# Patient Record
Sex: Female | Born: 1960
Health system: Southern US, Community
[De-identification: ages and names within clinical notes are randomized; demographics above are authoritative.]

## PROBLEM LIST (undated history)

## (undated) DIAGNOSIS — I10 Essential (primary) hypertension: Secondary | ICD-10-CM

## (undated) DIAGNOSIS — Z8489 Family history of other specified conditions: Secondary | ICD-10-CM

## (undated) DIAGNOSIS — Z952 Presence of prosthetic heart valve: Secondary | ICD-10-CM

## (undated) DIAGNOSIS — J45909 Unspecified asthma, uncomplicated: Secondary | ICD-10-CM

## (undated) DIAGNOSIS — E785 Hyperlipidemia, unspecified: Secondary | ICD-10-CM

## (undated) DIAGNOSIS — Q249 Congenital malformation of heart, unspecified: Secondary | ICD-10-CM

## (undated) DIAGNOSIS — E119 Type 2 diabetes mellitus without complications: Secondary | ICD-10-CM

## (undated) DIAGNOSIS — R404 Transient alteration of awareness: Secondary | ICD-10-CM

## (undated) DIAGNOSIS — Z9889 Other specified postprocedural states: Secondary | ICD-10-CM

## (undated) DIAGNOSIS — R112 Nausea with vomiting, unspecified: Secondary | ICD-10-CM

## (undated) DIAGNOSIS — Z950 Presence of cardiac pacemaker: Secondary | ICD-10-CM

## (undated) DIAGNOSIS — K219 Gastro-esophageal reflux disease without esophagitis: Secondary | ICD-10-CM

## (undated) DIAGNOSIS — F329 Major depressive disorder, single episode, unspecified: Secondary | ICD-10-CM

## (undated) DIAGNOSIS — H9190 Unspecified hearing loss, unspecified ear: Secondary | ICD-10-CM

## (undated) DIAGNOSIS — F32A Depression, unspecified: Secondary | ICD-10-CM

## (undated) HISTORY — DX: Essential (primary) hypertension: I10

## (undated) HISTORY — PX: CARDIAC VALVE REPLACEMENT: SHX585

## (undated) HISTORY — DX: Unspecified hearing loss, unspecified ear: H91.90

## (undated) HISTORY — DX: Congenital malformation of heart, unspecified: Q24.9

## (undated) HISTORY — DX: Major depressive disorder, single episode, unspecified: F32.9

## (undated) HISTORY — DX: Depression, unspecified: F32.A

## (undated) HISTORY — DX: Gastro-esophageal reflux disease without esophagitis: K21.9

## (undated) HISTORY — PX: CHOLECYSTECTOMY: SHX55

## (undated) HISTORY — DX: Unspecified asthma, uncomplicated: J45.909

## (undated) HISTORY — DX: Hyperlipidemia, unspecified: E78.5

## (undated) HISTORY — DX: Type 2 diabetes mellitus without complications: E11.9

---

## 1998-04-23 HISTORY — PX: ABDOMINAL HYSTERECTOMY: SHX81

## 2005-03-02 ENCOUNTER — Emergency Department: Payer: Self-pay | Admitting: Emergency Medicine

## 2006-04-12 ENCOUNTER — Emergency Department: Payer: Self-pay | Admitting: Emergency Medicine

## 2008-08-13 ENCOUNTER — Emergency Department: Payer: Self-pay | Admitting: Emergency Medicine

## 2009-03-20 ENCOUNTER — Emergency Department: Payer: Self-pay | Admitting: Internal Medicine

## 2009-09-20 ENCOUNTER — Emergency Department: Payer: Self-pay | Admitting: Emergency Medicine

## 2010-08-25 ENCOUNTER — Emergency Department: Payer: Self-pay | Admitting: Emergency Medicine

## 2010-09-27 ENCOUNTER — Emergency Department: Payer: Self-pay | Admitting: Emergency Medicine

## 2010-11-14 ENCOUNTER — Ambulatory Visit: Payer: Self-pay | Admitting: Unknown Physician Specialty

## 2010-11-22 ENCOUNTER — Ambulatory Visit: Payer: Self-pay | Admitting: Unknown Physician Specialty

## 2010-12-23 ENCOUNTER — Ambulatory Visit: Payer: Self-pay | Admitting: Unknown Physician Specialty

## 2011-01-22 ENCOUNTER — Ambulatory Visit: Payer: Self-pay | Admitting: Unknown Physician Specialty

## 2011-01-24 ENCOUNTER — Ambulatory Visit: Payer: Self-pay

## 2011-05-01 DIAGNOSIS — I10 Essential (primary) hypertension: Secondary | ICD-10-CM | POA: Insufficient documentation

## 2011-05-01 DIAGNOSIS — H9190 Unspecified hearing loss, unspecified ear: Secondary | ICD-10-CM | POA: Insufficient documentation

## 2011-05-01 DIAGNOSIS — K219 Gastro-esophageal reflux disease without esophagitis: Secondary | ICD-10-CM | POA: Insufficient documentation

## 2011-09-12 DIAGNOSIS — N3281 Overactive bladder: Secondary | ICD-10-CM | POA: Insufficient documentation

## 2011-09-12 DIAGNOSIS — F32A Depression, unspecified: Secondary | ICD-10-CM | POA: Insufficient documentation

## 2011-09-12 DIAGNOSIS — F329 Major depressive disorder, single episode, unspecified: Secondary | ICD-10-CM | POA: Insufficient documentation

## 2011-09-12 DIAGNOSIS — M549 Dorsalgia, unspecified: Secondary | ICD-10-CM | POA: Insufficient documentation

## 2011-09-12 DIAGNOSIS — F1490 Cocaine use, unspecified, uncomplicated: Secondary | ICD-10-CM | POA: Insufficient documentation

## 2011-09-12 DIAGNOSIS — J4540 Moderate persistent asthma, uncomplicated: Secondary | ICD-10-CM | POA: Insufficient documentation

## 2011-09-12 DIAGNOSIS — Z720 Tobacco use: Secondary | ICD-10-CM | POA: Insufficient documentation

## 2011-09-16 DIAGNOSIS — E789 Disorder of lipoprotein metabolism, unspecified: Secondary | ICD-10-CM | POA: Insufficient documentation

## 2012-06-19 ENCOUNTER — Ambulatory Visit: Payer: Self-pay

## 2013-07-14 ENCOUNTER — Ambulatory Visit: Payer: Self-pay | Admitting: Internal Medicine

## 2013-12-08 ENCOUNTER — Ambulatory Visit: Payer: Self-pay | Admitting: Family Medicine

## 2014-07-30 ENCOUNTER — Ambulatory Visit
Admit: 2014-07-30 | Disposition: A | Discharge status: Home / Self Care | Payer: Self-pay | Attending: Internal Medicine | Admitting: Internal Medicine

## 2015-05-04 DIAGNOSIS — R05 Cough: Secondary | ICD-10-CM | POA: Diagnosis not present

## 2015-05-23 DIAGNOSIS — I10 Essential (primary) hypertension: Secondary | ICD-10-CM | POA: Diagnosis not present

## 2015-05-23 DIAGNOSIS — E119 Type 2 diabetes mellitus without complications: Secondary | ICD-10-CM | POA: Diagnosis not present

## 2015-05-24 DIAGNOSIS — E119 Type 2 diabetes mellitus without complications: Secondary | ICD-10-CM | POA: Diagnosis not present

## 2015-05-24 DIAGNOSIS — I10 Essential (primary) hypertension: Secondary | ICD-10-CM | POA: Diagnosis not present

## 2015-05-27 DIAGNOSIS — R05 Cough: Secondary | ICD-10-CM | POA: Diagnosis not present

## 2015-05-27 DIAGNOSIS — R5383 Other fatigue: Secondary | ICD-10-CM | POA: Diagnosis not present

## 2015-05-30 DIAGNOSIS — E119 Type 2 diabetes mellitus without complications: Secondary | ICD-10-CM | POA: Diagnosis not present

## 2015-06-09 ENCOUNTER — Other Ambulatory Visit: Payer: Self-pay | Admitting: Internal Medicine

## 2015-06-09 DIAGNOSIS — Z1231 Encounter for screening mammogram for malignant neoplasm of breast: Secondary | ICD-10-CM

## 2015-06-19 DIAGNOSIS — J209 Acute bronchitis, unspecified: Secondary | ICD-10-CM | POA: Diagnosis not present

## 2015-06-21 DIAGNOSIS — I10 Essential (primary) hypertension: Secondary | ICD-10-CM | POA: Diagnosis not present

## 2015-07-22 DIAGNOSIS — E119 Type 2 diabetes mellitus without complications: Secondary | ICD-10-CM | POA: Diagnosis not present

## 2015-07-22 DIAGNOSIS — I10 Essential (primary) hypertension: Secondary | ICD-10-CM | POA: Diagnosis not present

## 2015-08-02 ENCOUNTER — Ambulatory Visit: Payer: Self-pay | Attending: Internal Medicine

## 2015-08-26 ENCOUNTER — Ambulatory Visit: Admission: RE | Admit: 2015-08-26 | Payer: Self-pay | Source: Ambulatory Visit

## 2015-08-31 DIAGNOSIS — R109 Unspecified abdominal pain: Secondary | ICD-10-CM | POA: Diagnosis not present

## 2015-08-31 DIAGNOSIS — N10 Acute tubulo-interstitial nephritis: Secondary | ICD-10-CM | POA: Diagnosis not present

## 2015-09-08 DIAGNOSIS — R350 Frequency of micturition: Secondary | ICD-10-CM | POA: Diagnosis not present

## 2015-09-08 DIAGNOSIS — M549 Dorsalgia, unspecified: Secondary | ICD-10-CM | POA: Diagnosis not present

## 2015-09-08 DIAGNOSIS — M546 Pain in thoracic spine: Secondary | ICD-10-CM | POA: Diagnosis not present

## 2015-10-21 DIAGNOSIS — E119 Type 2 diabetes mellitus without complications: Secondary | ICD-10-CM | POA: Diagnosis not present

## 2015-11-01 DIAGNOSIS — E119 Type 2 diabetes mellitus without complications: Secondary | ICD-10-CM | POA: Diagnosis not present

## 2015-11-01 DIAGNOSIS — I10 Essential (primary) hypertension: Secondary | ICD-10-CM | POA: Diagnosis not present

## 2015-11-01 DIAGNOSIS — J4530 Mild persistent asthma, uncomplicated: Secondary | ICD-10-CM | POA: Diagnosis not present

## 2016-01-10 ENCOUNTER — Ambulatory Visit: Payer: Self-pay

## 2016-01-26 ENCOUNTER — Ambulatory Visit: Payer: Self-pay | Attending: Internal Medicine

## 2016-01-27 DIAGNOSIS — E119 Type 2 diabetes mellitus without complications: Secondary | ICD-10-CM | POA: Diagnosis not present

## 2016-02-03 ENCOUNTER — Other Ambulatory Visit: Payer: Self-pay | Admitting: Internal Medicine

## 2016-02-03 DIAGNOSIS — J4520 Mild intermittent asthma, uncomplicated: Secondary | ICD-10-CM | POA: Diagnosis not present

## 2016-02-03 DIAGNOSIS — I10 Essential (primary) hypertension: Secondary | ICD-10-CM | POA: Diagnosis not present

## 2016-02-03 DIAGNOSIS — K759 Inflammatory liver disease, unspecified: Secondary | ICD-10-CM

## 2016-02-03 DIAGNOSIS — Z1231 Encounter for screening mammogram for malignant neoplasm of breast: Secondary | ICD-10-CM | POA: Diagnosis not present

## 2016-02-03 DIAGNOSIS — E789 Disorder of lipoprotein metabolism, unspecified: Secondary | ICD-10-CM | POA: Diagnosis not present

## 2016-02-03 DIAGNOSIS — E119 Type 2 diabetes mellitus without complications: Secondary | ICD-10-CM | POA: Diagnosis not present

## 2016-02-03 DIAGNOSIS — N390 Urinary tract infection, site not specified: Secondary | ICD-10-CM | POA: Diagnosis not present

## 2016-02-03 DIAGNOSIS — Z23 Encounter for immunization: Secondary | ICD-10-CM | POA: Diagnosis not present

## 2016-02-03 DIAGNOSIS — Z720 Tobacco use: Secondary | ICD-10-CM | POA: Diagnosis not present

## 2016-02-03 DIAGNOSIS — Z1211 Encounter for screening for malignant neoplasm of colon: Secondary | ICD-10-CM | POA: Diagnosis not present

## 2016-02-13 ENCOUNTER — Ambulatory Visit: Payer: Self-pay

## 2016-02-13 ENCOUNTER — Ambulatory Visit
Admission: RE | Admit: 2016-02-13 | Discharge: 2016-02-13 | Disposition: A | Discharge status: Home / Self Care | Payer: PPO | Source: Ambulatory Visit | Attending: Internal Medicine | Admitting: Internal Medicine

## 2016-02-13 DIAGNOSIS — K759 Inflammatory liver disease, unspecified: Secondary | ICD-10-CM | POA: Insufficient documentation

## 2016-02-13 DIAGNOSIS — R932 Abnormal findings on diagnostic imaging of liver and biliary tract: Secondary | ICD-10-CM | POA: Insufficient documentation

## 2016-02-13 DIAGNOSIS — Z9049 Acquired absence of other specified parts of digestive tract: Secondary | ICD-10-CM | POA: Diagnosis not present

## 2016-02-27 DIAGNOSIS — E119 Type 2 diabetes mellitus without complications: Secondary | ICD-10-CM | POA: Diagnosis not present

## 2016-03-23 ENCOUNTER — Encounter: Payer: PPO | Attending: Internal Medicine | Admitting: *Deleted

## 2016-03-23 ENCOUNTER — Encounter: Payer: Self-pay | Admitting: *Deleted

## 2016-03-23 VITALS — BP 132/84 | Ht 62.0 in | Wt 130.9 lb

## 2016-03-23 DIAGNOSIS — Z713 Dietary counseling and surveillance: Secondary | ICD-10-CM | POA: Insufficient documentation

## 2016-03-23 DIAGNOSIS — E119 Type 2 diabetes mellitus without complications: Secondary | ICD-10-CM | POA: Insufficient documentation

## 2016-03-23 NOTE — Patient Instructions (Signed)
Check blood sugars 1 x day before breakfast or 2 hrs after supper every day  Exercise: Continue walking for 30  minutes  3 days a week and gradually increase to 30 minutes 5 x week  Eat 3 meals day,  1-2 snacks a day Space meals 4-6 hours apart Complete 3 Day Food Record and bring to next appt  Bring blood sugar records to the next appointment  Return for appointment on: Friday April 13, 2016 at 1:30 pm with Brianna Harris (dietitian)

## 2016-03-23 NOTE — Progress Notes (Signed)
Diabetes Self-Management Education  Visit Type: First/Initial  Appt. Start Time: 1330 Appt. End Time: [id number]  03/23/2016  [name], identified by name and date of birth, is a 55 y.o. female with a diagnosis of Diabetes: Type 2.   ASSESSMENT  Blood pressure 132/84, height 5\' 2"  (1.575 m), weight 130 lb 14.4 oz (59.4 kg). Body mass index is 23.94 kg/m.      Diabetes Self-Management Education - 03/23/16 1545      Visit Information   Visit Type First/Initial     Initial Visit   Diabetes Type Type 2   Are you currently following a meal plan? No   Are you taking your medications as prescribed? Yes   Date Diagnosed 10 years ago     Health Coping   How would you rate your overall health? Excellent     Psychosocial Assessment   Patient Belief/Attitude about Diabetes Other (comment)  "happy"   Self-care barriers Hard of hearing  deaf   Self-management support Doctor's office;Friends   Other persons present Friend;Interpreter   Patient Concerns Nutrition/Meal planning;Glycemic Control;Medication;Monitoring   Special Needs Simplified materials   Preferred Learning Style Visual;Hands on   Learning Readiness Contemplating   How often do you need to have someone help you when you read instructions, pamphlets, or other written materials from your doctor or pharmacy? 3 - Sometimes   What is the last grade level you completed in school? 12th     Pre-Education Assessment   Patient understands the diabetes disease and treatment process. Needs Instruction   Patient understands incorporating nutritional management into lifestyle. Needs Instruction   Patient undertands incorporating physical activity into lifestyle. Needs Review   Patient understands using medications safely. Needs Instruction   Patient understands monitoring blood glucose, interpreting and using results Needs Review   Patient understands prevention, detection, and treatment of acute complications. Needs  Instruction   Patient understands prevention, detection, and treatment of chronic complications. Needs Instruction   Patient understands how to develop strategies to address psychosocial issues. Needs Instruction   Patient understands how to develop strategies to promote health/change behavior. Needs Instruction     Complications   Last HgB A1C per patient/outside source 8.4 %  01/27/16   How often do you check your blood sugar? 1-2 times/day  Pt reports checking BG daily at night and readings are 150's mg/dL.    Have you had a dilated eye exam in the past 12 months? Yes   Have you had a dental exam in the past 12 months? No   Are you checking your feet? Yes   How many days per week are you checking your feet? 7     Dietary Intake   Breakfast "eats out a lot for breakfast and lunch" - egg biscuit, cereal   Lunch salad, sub   Snack (afternoon) sugar free cookies   Dinner grilled meats, salad, vegetables   Snack (evening) fruit,, cheese   Beverage(s) water, unsweetened tea, coffe, diet soda     Exercise   Exercise Type Light (walking / raking leaves)   How many days per week to you exercise? 3   How many minutes per day do you exercise? 30   Total minutes per week of exercise 90     Patient Education   Previous Diabetes Education No   Disease state  Definition of diabetes, type 1 and 2, and the diagnosis of diabetes   Nutrition management  Role of diet in the treatment of diabetes  and the relationship between the three main macronutrients and blood glucose level;Information on hints to eating out and maintain blood glucose control.   Physical activity and exercise  Role of exercise on diabetes management, blood pressure control and cardiac health.   Medications Reviewed patients medication for diabetes, action, purpose, timing of dose and side effects.   Monitoring Purpose and frequency of SMBG.;Taught/discussed recording of test results and interpretation of SMBG.;Identified  appropriate SMBG and/or A1C goals.   Chronic complications Relationship between chronic complications and blood glucose control   Psychosocial adjustment Identified and addressed patients feelings and concerns about diabetes     Individualized Goals (developed by patient)   Reducing Risk Improve blood sugars Decrease medications Prevent diabetes complications     Outcomes   Expected Outcomes Demonstrated interest in learning. Expect positive outcomes      Individualized Plan for Diabetes Self-Management Training:   Learning Objective:  Patient will have a greater understanding of diabetes self-management. Patient education plan is to attend individual and/or group sessions per assessed needs and concerns.   Plan:   Patient Instructions  Check blood sugars 1 x day before breakfast or 2 hrs after supper every day Exercise: Continue walking for 30  minutes  3 days a week and gradually increase to 30 minutes 5 x week Eat 3 meals day,  1-2 snacks a day Space meals 4-6 hours apart Complete 3 Day Food Record and bring to next appt Bring blood sugar records to the next appointment Return for appointment on: Friday April 13, 2016 at 1:30 pm with Jaclyn Shaggy (dietitian)  Expected Outcomes:  Demonstrated interest in learning. Expect positive outcomes  Education material provided:  General Meal Planning Guidelines Simple Meal Plan 3 Day Food Record  If problems or questions, patient to contact team via:  Johny Drilling, RN, [place], CDE [phone]  Future DSME appointment:  Friday April 13, 2016 with the dietitian

## 2016-04-13 ENCOUNTER — Encounter: Payer: Self-pay | Admitting: Dietician

## 2016-04-13 ENCOUNTER — Encounter: Payer: PPO | Admitting: Dietician

## 2016-04-13 VITALS — Wt 130.3 lb

## 2016-04-13 DIAGNOSIS — E119 Type 2 diabetes mellitus without complications: Secondary | ICD-10-CM

## 2016-04-13 DIAGNOSIS — Z713 Dietary counseling and surveillance: Secondary | ICD-10-CM | POA: Diagnosis not present

## 2016-04-13 NOTE — Progress Notes (Signed)
Diabetes Self-Management Education  Visit Type:  Follow-up  Appt. Start Time: 1330 Appt. End Time: [id number]  04/13/2016  [name], identified by name and date of birth, is a 55 y.o. female with a diagnosis of Diabetes:  Type 2 Diabetes.   ASSESSMENT  Weight 130 lb 4.8 oz (59.1 kg). Body mass index is 23.83 kg/m.       Diabetes Self-Management Education - [id number]      Complications   Last HgB A1C per patient/outside source 8.4 %   How often do you check your blood sugar? 0 times/day (not testing)  none in past week; 4x in past 3 weeks   Postprandial Blood glucose range (mg/dL) 130-179   Have you had a dilated eye exam in the past 12 months? Yes   Have you had a dental exam in the past 12 months? No   Are you checking your feet? Yes   How many days per week are you checking your feet? 7     Dietary Intake   Breakfast cereal and milk or beef liver, biscuit, sausage    Lunch ham/cheese sub and corn chips or grilled chicken salad at [employer] as Psychiatric nurse sandwich at [employer] or [employer], diet [place] or cream of chicken soup   Beverage(s) water, diet soda, coffee     Exercise   Exercise Type Light (walking / raking leaves)   How many days per week to you exercise? 3   How many minutes per day do you exercise? 30   Total minutes per week of exercise 90     Patient Education   Nutrition management  Role of diet in the treatment of diabetes and the relationship between the three main macronutrients and blood glucose level;Carbohydrate counting;Information on hints to eating out and maintain blood glucose control.;Meal options for control of blood glucose level and chronic complications.     Individualized Goals (developed by patient)   Nutrition General guidelines for healthy choices and portions discussed;Follow meal plan discussed      Learning Objective:  Patient will have a greater understanding of diabetes self-management. Patient  education plan is to attend individual and/or group sessions per assessed needs and concerns.   Plan:   Patient Instructions  Balance meals with 2-4 oz. Protein, 2-3 servings of starch and at least 1 cup of "free vegetables". Can add a serving of fruit to any meal. Space 3 meals 4-5 hours apart. Limit added fats such at mayonnaise, margarine and salad dressings for example. Check some blood sugars before breakfast as well as 2 hours after a meal.    Education material provided: Food Guide Plate, Planning a Balanced Meal  If problems or questions, patient to contact team via: Karolee Stamps, RD [phone]  Future DSME appointment: -  05/04/16 at 1:30pm with RN

## 2016-04-13 NOTE — Patient Instructions (Signed)
Balance meals with 2-4 oz. Protein, 2-3 servings of starch and at least 1 cup of "free vegetables". Can add a serving of fruit to any meal. Space 3 meals 4-5 hours apart. Limit added fats such at mayonnaise, margarine and salad dressings for example. Check some blood sugars before breakfast as well as 2 hours after a meal.

## 2016-05-04 ENCOUNTER — Ambulatory Visit: Payer: PPO | Admitting: *Deleted

## 2016-05-21 DIAGNOSIS — IMO0001 Reserved for inherently not codable concepts without codable children: Secondary | ICD-10-CM | POA: Insufficient documentation

## 2016-05-21 DIAGNOSIS — F1020 Alcohol dependence, uncomplicated: Secondary | ICD-10-CM | POA: Insufficient documentation

## 2016-05-21 DIAGNOSIS — F1910 Other psychoactive substance abuse, uncomplicated: Secondary | ICD-10-CM | POA: Diagnosis not present

## 2016-05-21 DIAGNOSIS — R945 Abnormal results of liver function studies: Secondary | ICD-10-CM

## 2016-05-21 DIAGNOSIS — E119 Type 2 diabetes mellitus without complications: Secondary | ICD-10-CM | POA: Diagnosis not present

## 2016-05-21 DIAGNOSIS — Z1211 Encounter for screening for malignant neoplasm of colon: Secondary | ICD-10-CM | POA: Insufficient documentation

## 2016-05-21 DIAGNOSIS — R011 Cardiac murmur, unspecified: Secondary | ICD-10-CM | POA: Diagnosis not present

## 2016-05-21 DIAGNOSIS — R7989 Other specified abnormal findings of blood chemistry: Secondary | ICD-10-CM | POA: Insufficient documentation

## 2016-05-21 DIAGNOSIS — K219 Gastro-esophageal reflux disease without esophagitis: Secondary | ICD-10-CM | POA: Diagnosis not present

## 2016-05-25 ENCOUNTER — Telehealth: Payer: Self-pay | Admitting: *Deleted

## 2016-05-25 NOTE — Telephone Encounter (Signed)
Received voice mail from patient via deaf interpreter that she couldn't come to anymore diabetes appointments. Will discharge.

## 2016-06-06 ENCOUNTER — Ambulatory Visit
Admission: RE | Admit: 2016-06-06 | Discharge: 2016-06-06 | Disposition: A | Discharge status: Home / Self Care | Payer: PPO | Source: Ambulatory Visit | Attending: Internal Medicine | Admitting: Internal Medicine

## 2016-06-06 DIAGNOSIS — Z1231 Encounter for screening mammogram for malignant neoplasm of breast: Secondary | ICD-10-CM | POA: Insufficient documentation

## 2016-06-11 DIAGNOSIS — R011 Cardiac murmur, unspecified: Secondary | ICD-10-CM | POA: Diagnosis not present

## 2016-06-11 DIAGNOSIS — I10 Essential (primary) hypertension: Secondary | ICD-10-CM | POA: Diagnosis not present

## 2016-06-11 DIAGNOSIS — E7801 Familial hypercholesterolemia: Secondary | ICD-10-CM | POA: Diagnosis not present

## 2016-06-18 DIAGNOSIS — R634 Abnormal weight loss: Secondary | ICD-10-CM | POA: Diagnosis not present

## 2016-06-18 DIAGNOSIS — F17210 Nicotine dependence, cigarettes, uncomplicated: Secondary | ICD-10-CM | POA: Diagnosis not present

## 2016-06-20 DIAGNOSIS — R634 Abnormal weight loss: Secondary | ICD-10-CM | POA: Diagnosis not present

## 2016-06-22 ENCOUNTER — Encounter: Payer: Self-pay | Admitting: *Deleted

## 2016-06-22 DIAGNOSIS — R011 Cardiac murmur, unspecified: Secondary | ICD-10-CM | POA: Diagnosis not present

## 2016-07-05 ENCOUNTER — Telehealth: Payer: Self-pay | Admitting: *Deleted

## 2016-07-06 ENCOUNTER — Ambulatory Visit: Payer: PPO

## 2016-07-09 DIAGNOSIS — E119 Type 2 diabetes mellitus without complications: Secondary | ICD-10-CM | POA: Diagnosis not present

## 2016-07-09 DIAGNOSIS — I10 Essential (primary) hypertension: Secondary | ICD-10-CM | POA: Diagnosis not present

## 2016-07-09 DIAGNOSIS — E7801 Familial hypercholesterolemia: Secondary | ICD-10-CM | POA: Diagnosis not present

## 2016-07-12 DIAGNOSIS — I10 Essential (primary) hypertension: Secondary | ICD-10-CM | POA: Diagnosis not present

## 2016-07-12 DIAGNOSIS — Z79899 Other long term (current) drug therapy: Secondary | ICD-10-CM | POA: Diagnosis not present

## 2016-07-12 DIAGNOSIS — E119 Type 2 diabetes mellitus without complications: Secondary | ICD-10-CM | POA: Diagnosis not present

## 2016-07-19 ENCOUNTER — Inpatient Hospital Stay: Payer: PPO | Attending: Oncology

## 2016-07-19 ENCOUNTER — Inpatient Hospital Stay (HOSPITAL_BASED_OUTPATIENT_CLINIC_OR_DEPARTMENT_OTHER): Payer: PPO | Admitting: Oncology

## 2016-07-19 ENCOUNTER — Encounter (INDEPENDENT_AMBULATORY_CARE_PROVIDER_SITE_OTHER): Payer: Self-pay

## 2016-07-19 ENCOUNTER — Other Ambulatory Visit: Payer: Self-pay | Admitting: *Deleted

## 2016-07-19 ENCOUNTER — Encounter: Payer: Self-pay | Admitting: Oncology

## 2016-07-19 VITALS — BP 135/86 | HR 74 | Temp 93.8°F | Resp 18 | Ht 62.0 in | Wt 125.0 lb

## 2016-07-19 DIAGNOSIS — F329 Major depressive disorder, single episode, unspecified: Secondary | ICD-10-CM | POA: Diagnosis not present

## 2016-07-19 DIAGNOSIS — F17210 Nicotine dependence, cigarettes, uncomplicated: Secondary | ICD-10-CM

## 2016-07-19 DIAGNOSIS — D72820 Lymphocytosis (symptomatic): Secondary | ICD-10-CM

## 2016-07-19 DIAGNOSIS — K219 Gastro-esophageal reflux disease without esophagitis: Secondary | ICD-10-CM | POA: Diagnosis not present

## 2016-07-19 DIAGNOSIS — Z79899 Other long term (current) drug therapy: Secondary | ICD-10-CM | POA: Diagnosis not present

## 2016-07-19 DIAGNOSIS — I10 Essential (primary) hypertension: Secondary | ICD-10-CM | POA: Diagnosis not present

## 2016-07-19 DIAGNOSIS — E1165 Type 2 diabetes mellitus with hyperglycemia: Secondary | ICD-10-CM | POA: Insufficient documentation

## 2016-07-19 DIAGNOSIS — J45909 Unspecified asthma, uncomplicated: Secondary | ICD-10-CM | POA: Insufficient documentation

## 2016-07-19 DIAGNOSIS — Z7984 Long term (current) use of oral hypoglycemic drugs: Secondary | ICD-10-CM | POA: Insufficient documentation

## 2016-07-19 DIAGNOSIS — H9190 Unspecified hearing loss, unspecified ear: Secondary | ICD-10-CM | POA: Insufficient documentation

## 2016-07-19 DIAGNOSIS — E119 Type 2 diabetes mellitus without complications: Secondary | ICD-10-CM | POA: Insufficient documentation

## 2016-07-19 DIAGNOSIS — E785 Hyperlipidemia, unspecified: Secondary | ICD-10-CM | POA: Insufficient documentation

## 2016-07-19 LAB — DIFFERENTIAL
Basophils Absolute: 0.1 10*3/uL (ref 0–0.1)
Basophils Relative: 1 %
Eosinophils Absolute: 0.8 10*3/uL — ABNORMAL HIGH (ref 0–0.7)
Eosinophils Relative: 5 %
Lymphocytes Relative: 32 %
Lymphs Abs: 4.6 10*3/uL — ABNORMAL HIGH (ref 1.0–3.6)
Monocytes Absolute: 0.6 10*3/uL (ref 0.2–0.9)
Monocytes Relative: 4 %
Neutro Abs: 8.4 10*3/uL — ABNORMAL HIGH (ref 1.4–6.5)
Neutrophils Relative %: 58 %

## 2016-07-19 LAB — CBC
HCT: 43.9 % (ref 35.0–47.0)
Hemoglobin: 15.1 g/dL (ref 12.0–16.0)
MCH: 30.3 pg (ref 26.0–34.0)
MCHC: 34.3 g/dL (ref 32.0–36.0)
MCV: 88.4 fL (ref 80.0–100.0)
Platelets: 428 10*3/uL (ref 150–440)
RBC: 4.96 MIL/uL (ref 3.80–5.20)
RDW: 13.7 % (ref 11.5–14.5)
WBC: 14.5 10*3/uL — ABNORMAL HIGH (ref 3.6–11.0)

## 2016-07-19 NOTE — Progress Notes (Signed)
Here referred by Dr Gilford Rile for abnormal labs pt was here w mother and deaf interpretor Carline Bussiere [phone] )

## 2016-07-19 NOTE — Progress Notes (Addendum)
Hematology/Oncology Consult note [facility] [phone] [phone]  Patient Care Team: Madelyn Brunner, MD as PCP - General (Internal Medicine)   Name of the patient: Brianna Harris  [id number]  1961-01-23    Reason for referral- leucocytosis/ lymphocytosis   Referring physician- Dr. Lisette Grinder  Date of visit: 07/19/16   History of presenting illness- patient is a 56 year old female who was been sent to [place] by her PCP for evaluation of leukocytosis/lymphocytosis. Patient is deaf and requires sign language for interpreter and is here with her interpreter and family member today. . On review of her outside blood work patient has had chronic leukocytosis dating back at least to August 2015 and a white count ranges between 11-13. Most recent CBC from 06/20/2016 showed white count of 12.9, H&H of 14.8/43.9 and a platelet count of 386. Differential on the CBC mainly showed absolute lymphocytosis with lymphocyte count between 3600-4200 and the past. She has also been noted to have mild eosinophilia between 600-700 and the past. Patient does have uncontrolled diabetes but otherwise her CMP is within normal limits. Patient under family member reports unintentional weight loss of about 15-20 pounds over the last 6 months. Patient smokes about 1-1/2 packs per day and has been doing so for the last 15-20 years. Reports some fatigue. Denies other complaints  ECOG PS- 1  Pain scale- 0   Review of systems- Review of Systems  Constitutional: Positive for malaise/fatigue and weight loss.    No Known Allergies  There are no active problems to display for this patient.    Past Medical History:  Diagnosis Date  . Asthma   . Deafness   . Depression   . Diabetes mellitus without complication ([place])   . GERD (gastroesophageal reflux disease)   . Heart abnormality    per mother pt has bad heart murmor- sees Dr Ubaldo Glassing   . Hyperlipidemia   . Hypertension       Past Surgical History:  Procedure Laterality Date  . ABDOMINAL HYSTERECTOMY  2000   per mother [facility] thought pt cancer and did hyster ,cervix and ovaries  . CHOLECYSTECTOMY      Social History   Social History  . Marital status: Single    Spouse name: N/A  . Number of children: N/A  . Years of education: N/A   Occupational History  . Not on file.   Social History Main Topics  . Smoking status: Current Every Day Smoker    Packs/day: 1.50    Years: 15.00    Types: Cigarettes  . Smokeless tobacco: Never Used  . Alcohol use 3.0 oz/week    5 Glasses of wine per week  . Drug use: Yes    Types: Marijuana     Comment: smoked 3 yr ago,for 2 y   . Sexual activity: No   Other Topics Concern  . Not on file   Social History Narrative  . No narrative on file     Family History  Problem Relation Age of Onset  . Diabetes Maternal Uncle   . Heart attack Father   . Kidney cancer Paternal Uncle   . Breast cancer Neg Hx      Current Outpatient Prescriptions:  .  albuterol (PROVENTIL HFA;VENTOLIN HFA) 108 (90 Base) MCG/ACT inhaler, Inhale into the lungs., Disp: , Rfl:  .  ASPIRIN 81 PO, 1 tab by mouth as needed, Disp: , Rfl:  .  atorvastatin (LIPITOR) 40 MG tablet, TAKE ONE TABLET  BY MOUTH ONCE DAILY, Disp: , Rfl:  .  fluticasone furoate-vilanterol (BREO ELLIPTA) 100-25 MCG/INH AEPB, INHALE ONE PUFF BY MOUTH ONCE DAILY, Disp: , Rfl:  .  hydrochlorothiazide (HYDRODIURIL) 25 MG tablet, Take 25 mg by mouth daily. , Disp: , Rfl:  .  lisinopril (PRINIVIL,ZESTRIL) 20 MG tablet, Take 20 mg by mouth daily. , Disp: , Rfl:  .  metFORMIN (GLUCOPHAGE) 500 MG tablet, Take 500 mg by mouth 2 (two) times daily with a meal., Disp: , Rfl:  .  omeprazole (PRILOSEC) 20 MG capsule, Take 20 mg by mouth daily. , Disp: , Rfl:  .  ONE TOUCH ULTRA TEST test strip, , Disp: , Rfl:  .  amLODipine (NORVASC) 5 MG tablet, Take 5 mg by mouth daily. , Disp: , Rfl:    Physical exam:  Vitals:   07/19/16  1144  BP: 135/86  Pulse: 74  Resp: 18  Temp: (!) 93.8 F (34.3 C)  TempSrc: Tympanic  Weight: 125 lb (56.7 kg)  Height: 5\' 2"  (1.575 m)   Physical Exam  Constitutional: She is oriented to person, place, and time and well-developed, well-nourished, and in no distress.  HENT:  Head: Normocephalic and atraumatic.  Eyes: EOM are normal. Pupils are equal, round, and reactive to light.  Neck: Normal range of motion.  Cardiovascular: Normal rate and regular rhythm.   Murmur (systolic murmur +) heard. Pulmonary/Chest: Effort normal and breath sounds normal.  Abdominal: Soft. Bowel sounds are normal.  Lymphadenopathy:  No palpable cervical axillary or inguinal adenopathy  Neurological: She is alert and oriented to person, place, and time.  Skin: Skin is warm and dry.       No flowsheet data found. CBC Latest Ref Rng & Units 07/19/2016  WBC 3.6 - 11.0 K/uL 14.5(H)  Hemoglobin 12.0 - 16.0 g/dL 15.1  Hematocrit 35.0 - 47.0 % 43.9  Platelets 150 - 440 K/uL 428     Assessment and plan- Patient is a 56 y.o. female who has been referred to [place] for evaluation of leukocytosis/lymphocytosis  Today I will obtain a manual CBC with differential as well as get a pathology review of her smear. I will check a peripheral flow cytometry to rule out abdominal process such as CLL that could be contributing towards lymphocytosis. I will also check BCR able for CML. I will see her back in 2 weeks' time to discuss the results of her blood work  Abnormal weight loss is concerning. If the results of flow cytometry are consistent with CLL, I will be inclined to obtain a baseline CT chest and abdomen pelvis to look for adenopathy although I do not palpate any adenopathy today. The flow cytometry does not reveal CLL I will consider low-dose screening CT scan for lung cancer given her strong history of smoking  Tobacco dependence-patient is not interested in quitting smoking at this time   Thank you for this  kind referral and the opportunity to participate in the care of this patient   Visit Diagnosis 1. Lymphocytosis     Dr. Randa Evens, MD, MPH [place] at [facility] Pager- [phone] 07/19/2016

## 2016-07-20 LAB — PATHOLOGIST SMEAR REVIEW

## 2016-07-26 LAB — COMP PANEL: LEUKEMIA/LYMPHOMA

## 2016-07-31 LAB — BCR-ABL1 FISH
Cells Analyzed: 200
Cells Counted: 200

## 2016-08-02 ENCOUNTER — Telehealth: Payer: Self-pay | Admitting: *Deleted

## 2016-08-02 ENCOUNTER — Encounter: Payer: Self-pay | Admitting: Oncology

## 2016-08-02 ENCOUNTER — Inpatient Hospital Stay: Payer: PPO | Attending: Oncology | Admitting: Oncology

## 2016-08-02 VITALS — BP 115/75 | HR 80 | Temp 97.2°F | Resp 18 | Wt 124.2 lb

## 2016-08-02 DIAGNOSIS — F17210 Nicotine dependence, cigarettes, uncomplicated: Secondary | ICD-10-CM | POA: Insufficient documentation

## 2016-08-02 DIAGNOSIS — J45909 Unspecified asthma, uncomplicated: Secondary | ICD-10-CM | POA: Insufficient documentation

## 2016-08-02 DIAGNOSIS — H9190 Unspecified hearing loss, unspecified ear: Secondary | ICD-10-CM | POA: Insufficient documentation

## 2016-08-02 DIAGNOSIS — K219 Gastro-esophageal reflux disease without esophagitis: Secondary | ICD-10-CM | POA: Diagnosis not present

## 2016-08-02 DIAGNOSIS — Z7984 Long term (current) use of oral hypoglycemic drugs: Secondary | ICD-10-CM | POA: Insufficient documentation

## 2016-08-02 DIAGNOSIS — F329 Major depressive disorder, single episode, unspecified: Secondary | ICD-10-CM | POA: Insufficient documentation

## 2016-08-02 DIAGNOSIS — Z79899 Other long term (current) drug therapy: Secondary | ICD-10-CM | POA: Diagnosis not present

## 2016-08-02 DIAGNOSIS — I10 Essential (primary) hypertension: Secondary | ICD-10-CM | POA: Insufficient documentation

## 2016-08-02 DIAGNOSIS — D729 Disorder of white blood cells, unspecified: Secondary | ICD-10-CM

## 2016-08-02 DIAGNOSIS — E785 Hyperlipidemia, unspecified: Secondary | ICD-10-CM | POA: Insufficient documentation

## 2016-08-02 DIAGNOSIS — D72820 Lymphocytosis (symptomatic): Secondary | ICD-10-CM | POA: Insufficient documentation

## 2016-08-02 DIAGNOSIS — E119 Type 2 diabetes mellitus without complications: Secondary | ICD-10-CM | POA: Diagnosis not present

## 2016-08-02 NOTE — Telephone Encounter (Signed)
Received referral for initial lung cancer screening scan. Contacted patient via email at the referring providers recommendation. Informed patient that she will not be eligible for screening scan until age 56. Requested more information to verify smoking history. Will plan to encourage smoking cessation programs if applicable.

## 2016-08-02 NOTE — Progress Notes (Signed)
Hematology/Oncology Consult note [facility] Telephone:([phone] [phone]  Patient Care Team: Madelyn Brunner, MD as PCP - General (Internal Medicine)   Name of the patient: Brianna Harris  [id number]  05/18/1960    Reason for referral- leucocytosis/ lymphocytosis likely reactive   Referring physician- Dr. Lisette Grinder  Date of visit: 08/02/16   History of presenting illness- patient is a 56 year old female who was been sent to [place] by her PCP for evaluation of leukocytosis/lymphocytosis. Patient is deaf and requires sign language for interpreter and is here with her interpreter and mother today. . On review of her outside blood work patient has had chronic leukocytosis dating back at least to August 2015 and a white count ranges between 11-13. Most recent CBC from 07/19/2016 showed white count of 14.5, H&H of 15.1/43.9 and a platelet count of 428. Differential on the CBC mainly showed absolute lymphocytosis with lymphocyte count between 3600-4200 in the past, 4600 on most recent labs.  She has also been noted to have mild eosinophilia between 600-700 in the past, 800 on most recent labs.  ANC is 84.  Patient has had uncontrolled diabetes but otherwise her CMP has been within normal limits, last checked on 06/20/2016.  She reports a good appetite, her weight is down 1 pound.  She continues to smoke about 1-1/2 packs per day and has been doing so for the last 15-20 years.  She is contemplating quitting and will follow-up with Dr. Gilford Rile, her PCP.  Reports some fatigue.  Reports urinary frequency and urgency.  Denies other complaints.  Blood work from 07/19/16 showed cbc with wbc of 14.5 with neutrophilia and lymphocytosis. Bcr abl testing was negative for cml.   ECOG PS- 1  Pain scale- 0   Review of systems- Review of Systems  Constitutional: Positive for malaise/fatigue and weight loss.  HENT: Positive for hearing loss. Negative for congestion, ear  discharge and nosebleeds.   Respiratory: Positive for cough. Negative for shortness of breath.        Smoker's cough  Cardiovascular: Positive for PND. Negative for chest pain and leg swelling.  Gastrointestinal: Negative for abdominal pain, blood in stool, constipation, diarrhea, melena, nausea and vomiting.  Genitourinary: Positive for frequency and urgency. Negative for hematuria.  Musculoskeletal: Positive for joint pain.       OA - left thumb  Skin: Negative.   Neurological: Negative for tingling, tremors and headaches.  Endo/Heme/Allergies: Does not bruise/bleed easily.  Psychiatric/Behavioral: The patient does not have insomnia.     No Known Allergies  Patient Active Problem List   Diagnosis Date Noted  . Asthma without status asthmaticus 07/19/2016  . Diabetes mellitus type 2, uncomplicated ([place]) 57/84/6962  . Chronic depression 09/12/2011  . Deafness 05/01/2011     Past Medical History:  Diagnosis Date  . Asthma   . Deafness   . Depression   . Diabetes mellitus without complication ([place])   . GERD (gastroesophageal reflux disease)   . Heart abnormality    per mother pt has bad heart murmor- sees Dr Ubaldo Glassing   . Hyperlipidemia   . Hypertension      Past Surgical History:  Procedure Laterality Date  . ABDOMINAL HYSTERECTOMY  2000   per mother [facility] thought pt cancer and did hyster ,cervix and ovaries  . CHOLECYSTECTOMY      Social History   Social History  . Marital status: Single    Spouse name: N/A  . Number of children: N/A  .  Years of education: N/A   Occupational History  . Not on file.   Social History Main Topics  . Smoking status: Current Every Day Smoker    Packs/day: 1.50    Years: 15.00    Types: Cigarettes  . Smokeless tobacco: Never Used  . Alcohol use 3.0 oz/week    5 Glasses of wine per week  . Drug use: Yes    Types: Marijuana     Comment: smoked 3 yr ago,for 2 y   . Sexual activity: No   Other Topics Concern  . Not on file    Social History Narrative  . No narrative on file     Family History  Problem Relation Age of Onset  . Diabetes Maternal Uncle   . Heart attack Father   . Kidney cancer Paternal Uncle   . Breast cancer Neg Hx      Current Outpatient Prescriptions:  .  albuterol (PROVENTIL HFA;VENTOLIN HFA) 108 (90 Base) MCG/ACT inhaler, Inhale into the lungs., Disp: , Rfl:  .  amLODipine (NORVASC) 5 MG tablet, Take 5 mg by mouth daily. , Disp: , Rfl:  .  ASPIRIN 81 PO, 1 tab by mouth as needed, Disp: , Rfl:  .  atorvastatin (LIPITOR) 40 MG tablet, TAKE ONE TABLET BY MOUTH ONCE DAILY, Disp: , Rfl:  .  fluticasone furoate-vilanterol (BREO ELLIPTA) 100-25 MCG/INH AEPB, INHALE ONE PUFF BY MOUTH ONCE DAILY, Disp: , Rfl:  .  hydrochlorothiazide (HYDRODIURIL) 25 MG tablet, Take 25 mg by mouth daily. , Disp: , Rfl:  .  lisinopril (PRINIVIL,ZESTRIL) 20 MG tablet, Take 20 mg by mouth daily. , Disp: , Rfl:  .  metFORMIN (GLUCOPHAGE) 500 MG tablet, Take 500 mg by mouth 2 (two) times daily with a meal., Disp: , Rfl:  .  omeprazole (PRILOSEC) 20 MG capsule, Take 20 mg by mouth daily. , Disp: , Rfl:  .  ONE TOUCH ULTRA TEST test strip, , Disp: , Rfl:    Physical exam:  Vitals:   08/02/16 0953  BP: 115/75  Pulse: 80  Resp: 18  Temp: 97.2 F (36.2 C)  TempSrc: Tympanic  Weight: 124 lb 4 oz (56.4 kg)   Physical Exam  Constitutional: She is oriented to person, place, and time and well-developed, well-nourished, and in no distress.  HENT:  Head: Normocephalic and atraumatic.  Eyes: EOM are normal. Pupils are equal, round, and reactive to light.  Neck: Normal range of motion.  Cardiovascular: Normal rate and regular rhythm.   Murmur (systolic murmur +) heard. Pulmonary/Chest: Effort normal. She has wheezes.  Abdominal: Soft. Bowel sounds are normal.  Lymphadenopathy:  No palpable cervical axillary or inguinal adenopathy  Neurological: She is alert and oriented to person, place, and time.  Skin: Skin  is warm and dry.  Psychiatric: Affect normal.       No flowsheet data found. CBC Latest Ref Rng & Units 07/19/2016  WBC 3.6 - 11.0 K/uL 14.5(H)  Hemoglobin 12.0 - 16.0 g/dL 15.1  Hematocrit 35.0 - 47.0 % 43.9  Platelets 150 - 440 K/uL 428     Assessment and plan- Patient is a 56 y.o. female who has been referred to [place] for evaluation of leukocytosis/lymphocytosis  1.  Leucocytosis- likely reactive from underlying smoking. Review of recent lab work.  Path review revealed leukocytosis with normal morphology.  BCR-ABL by FISH was normal. Flow cytometry did not reval any leukemia or lymphoma  2.  Refer for low-dose CT lung cancer screening (done).  3.  Tobacco dependent-patient is starting to contemplate quitting smoking.  Will follow-up with PCP for assistance when ready.  4.  Follow-up in 4 months for CBC, MD assessment.   Thank you for this kind referral and the opportunity to participate in the care of this patient   Visit Diagnosis Leukocytosis/lymphocytosis  Lucendia Herrlich, NP  08/02/16 11:16 AM

## 2016-08-02 NOTE — Progress Notes (Signed)
Patient offers no complaints today. 

## 2016-08-03 DIAGNOSIS — J4540 Moderate persistent asthma, uncomplicated: Secondary | ICD-10-CM | POA: Diagnosis not present

## 2016-08-03 DIAGNOSIS — Z720 Tobacco use: Secondary | ICD-10-CM | POA: Diagnosis not present

## 2016-08-03 DIAGNOSIS — E119 Type 2 diabetes mellitus without complications: Secondary | ICD-10-CM | POA: Diagnosis not present

## 2016-08-03 DIAGNOSIS — I10 Essential (primary) hypertension: Secondary | ICD-10-CM | POA: Diagnosis not present

## 2016-08-03 DIAGNOSIS — I350 Nonrheumatic aortic (valve) stenosis: Secondary | ICD-10-CM | POA: Diagnosis not present

## 2016-08-03 DIAGNOSIS — R350 Frequency of micturition: Secondary | ICD-10-CM | POA: Diagnosis not present

## 2016-08-10 ENCOUNTER — Ambulatory Visit: Admit: 2016-08-10 | Payer: PPO | Admitting: Unknown Physician Specialty

## 2016-08-10 SURGERY — COLONOSCOPY WITH PROPOFOL
Anesthesia: General

## 2016-08-17 ENCOUNTER — Encounter
Admission: RE | Disposition: A | Discharge status: Home / Self Care | Payer: Self-pay | Source: Ambulatory Visit | Attending: Unknown Physician Specialty

## 2016-08-17 ENCOUNTER — Ambulatory Visit: Payer: PPO | Admitting: Anesthesiology

## 2016-08-17 ENCOUNTER — Ambulatory Visit
Admission: RE | Admit: 2016-08-17 | Discharge: 2016-08-17 | Disposition: A | Discharge status: Home / Self Care | Payer: PPO | Source: Ambulatory Visit | Attending: Unknown Physician Specialty | Admitting: Unknown Physician Specialty

## 2016-08-17 ENCOUNTER — Encounter: Payer: Self-pay | Admitting: *Deleted

## 2016-08-17 DIAGNOSIS — F17210 Nicotine dependence, cigarettes, uncomplicated: Secondary | ICD-10-CM | POA: Insufficient documentation

## 2016-08-17 DIAGNOSIS — K219 Gastro-esophageal reflux disease without esophagitis: Secondary | ICD-10-CM | POA: Diagnosis not present

## 2016-08-17 DIAGNOSIS — Z7982 Long term (current) use of aspirin: Secondary | ICD-10-CM | POA: Insufficient documentation

## 2016-08-17 DIAGNOSIS — K621 Rectal polyp: Secondary | ICD-10-CM | POA: Insufficient documentation

## 2016-08-17 DIAGNOSIS — E785 Hyperlipidemia, unspecified: Secondary | ICD-10-CM | POA: Insufficient documentation

## 2016-08-17 DIAGNOSIS — K449 Diaphragmatic hernia without obstruction or gangrene: Secondary | ICD-10-CM | POA: Insufficient documentation

## 2016-08-17 DIAGNOSIS — F329 Major depressive disorder, single episode, unspecified: Secondary | ICD-10-CM | POA: Insufficient documentation

## 2016-08-17 DIAGNOSIS — H9190 Unspecified hearing loss, unspecified ear: Secondary | ICD-10-CM | POA: Diagnosis not present

## 2016-08-17 DIAGNOSIS — Z1211 Encounter for screening for malignant neoplasm of colon: Secondary | ICD-10-CM | POA: Diagnosis not present

## 2016-08-17 DIAGNOSIS — K2960 Other gastritis without bleeding: Secondary | ICD-10-CM | POA: Diagnosis not present

## 2016-08-17 DIAGNOSIS — I10 Essential (primary) hypertension: Secondary | ICD-10-CM | POA: Insufficient documentation

## 2016-08-17 DIAGNOSIS — J45909 Unspecified asthma, uncomplicated: Secondary | ICD-10-CM | POA: Diagnosis not present

## 2016-08-17 DIAGNOSIS — K2950 Unspecified chronic gastritis without bleeding: Secondary | ICD-10-CM | POA: Insufficient documentation

## 2016-08-17 DIAGNOSIS — K648 Other hemorrhoids: Secondary | ICD-10-CM | POA: Diagnosis not present

## 2016-08-17 DIAGNOSIS — Z90710 Acquired absence of both cervix and uterus: Secondary | ICD-10-CM | POA: Insufficient documentation

## 2016-08-17 DIAGNOSIS — Z7984 Long term (current) use of oral hypoglycemic drugs: Secondary | ICD-10-CM | POA: Diagnosis not present

## 2016-08-17 DIAGNOSIS — E119 Type 2 diabetes mellitus without complications: Secondary | ICD-10-CM | POA: Diagnosis not present

## 2016-08-17 DIAGNOSIS — K640 First degree hemorrhoids: Secondary | ICD-10-CM | POA: Diagnosis not present

## 2016-08-17 DIAGNOSIS — K2930 Chronic superficial gastritis without bleeding: Secondary | ICD-10-CM | POA: Diagnosis not present

## 2016-08-17 DIAGNOSIS — Z9049 Acquired absence of other specified parts of digestive tract: Secondary | ICD-10-CM | POA: Insufficient documentation

## 2016-08-17 DIAGNOSIS — D125 Benign neoplasm of sigmoid colon: Secondary | ICD-10-CM | POA: Diagnosis not present

## 2016-08-17 DIAGNOSIS — K2970 Gastritis, unspecified, without bleeding: Secondary | ICD-10-CM | POA: Diagnosis not present

## 2016-08-17 DIAGNOSIS — Z79899 Other long term (current) drug therapy: Secondary | ICD-10-CM | POA: Diagnosis not present

## 2016-08-17 DIAGNOSIS — K3189 Other diseases of stomach and duodenum: Secondary | ICD-10-CM | POA: Diagnosis not present

## 2016-08-17 DIAGNOSIS — D128 Benign neoplasm of rectum: Secondary | ICD-10-CM | POA: Diagnosis not present

## 2016-08-17 DIAGNOSIS — K635 Polyp of colon: Secondary | ICD-10-CM | POA: Diagnosis not present

## 2016-08-17 HISTORY — PX: COLONOSCOPY WITH PROPOFOL: SHX5780

## 2016-08-17 HISTORY — PX: ESOPHAGOGASTRODUODENOSCOPY (EGD) WITH PROPOFOL: SHX5813

## 2016-08-17 LAB — URINE DRUG SCREEN, QUALITATIVE (ARMC ONLY)
Amphetamines, Ur Screen: NOT DETECTED
Barbiturates, Ur Screen: NOT DETECTED
Benzodiazepine, Ur Scrn: NOT DETECTED
Cannabinoid 50 Ng, Ur ~~LOC~~: NOT DETECTED
Cocaine Metabolite,Ur ~~LOC~~: NOT DETECTED
MDMA (Ecstasy)Ur Screen: NOT DETECTED
Methadone Scn, Ur: NOT DETECTED
Opiate, Ur Screen: NOT DETECTED
Phencyclidine (PCP) Ur S: NOT DETECTED
Tricyclic, Ur Screen: NOT DETECTED

## 2016-08-17 SURGERY — COLONOSCOPY WITH PROPOFOL
Anesthesia: General

## 2016-08-17 MED ORDER — SODIUM CHLORIDE 0.9 % IV SOLN: INTRAVENOUS | Status: DC

## 2016-08-17 MED ORDER — SODIUM CHLORIDE 0.9 % IV SOLN
INTRAVENOUS | Status: DC
  Administered 2016-08-17: 09:00:00 via INTRAVENOUS

## 2016-08-17 MED ORDER — PROPOFOL 500 MG/50ML IV EMUL
INTRAVENOUS | Status: DC | PRN
  Administered 2016-08-17: 120 ug/kg/min via INTRAVENOUS

## 2016-08-17 MED ORDER — PROPOFOL 500 MG/50ML IV EMUL
INTRAVENOUS | Status: AC
  Filled 2016-08-17: qty 50

## 2016-08-17 MED ORDER — PROPOFOL 10 MG/ML IV BOLUS
INTRAVENOUS | Status: DC | PRN
  Administered 2016-08-17: 90 mg via INTRAVENOUS

## 2016-08-17 MED ORDER — SODIUM CHLORIDE 0.9 % IV SOLN
INTRAVENOUS | Status: DC
Start: 2016-08-17 — End: 2016-08-17

## 2016-08-17 NOTE — Anesthesia Preprocedure Evaluation (Signed)
Anesthesia Evaluation  Patient identified by MRN, date of birth, ID band Patient awake    Reviewed: Allergy & Precautions, H&P , NPO status , Patient's Chart, lab work & pertinent test results, reviewed documented beta blocker date and time   Airway Mallampati: III  TM Distance: >3 FB Neck ROM: full    Dental   Pulmonary neg shortness of breath, asthma , neg sleep apnea, neg COPD, neg recent URI, Current Smoker,           Cardiovascular Exercise Tolerance: Good hypertension, On Medications (-) CAD, (-) Past MI, (-) Cardiac Stents and (-) CABG (-) dysrhythmias + Valvular Problems/Murmurs      Neuro/Psych PSYCHIATRIC DISORDERS (Depression) negative neurological ROS     GI/Hepatic GERD  ,(+)     substance abuse  alcohol use, Fatty liver disease   Endo/Other  diabetes, Type 2, Oral Hypoglycemic Agents  Renal/GU negative Renal ROS  negative genitourinary   Musculoskeletal   Abdominal   Peds  Hematology negative hematology ROS (+)   Anesthesia Other Findings Past Medical History: No date: Asthma No date: Deafness No date: Depression No date: Diabetes mellitus without complication (HCC) No date: GERD (gastroesophageal reflux disease) No date: Heart abnormality     Comment: per mother pt has bad heart murmor- sees Dr               Ubaldo Glassing  No date: Hyperlipidemia No date: Hypertension   Reproductive/Obstetrics negative OB ROS                             Anesthesia Physical Anesthesia Plan  ASA: III  Anesthesia Plan: General   Post-op Pain Management:    Induction:   Airway Management Planned:   Additional Equipment:   Intra-op Plan:   Post-operative Plan:   Informed Consent: I have reviewed the patients History and Physical, chart, labs and discussed the procedure including the risks, benefits and alternatives for the proposed anesthesia with the patient or authorized  representative who has indicated his/her understanding and acceptance.   Dental Advisory Given  Plan Discussed with: Anesthesiologist, CRNA and Surgeon  Anesthesia Plan Comments:         Anesthesia Quick Evaluation

## 2016-08-17 NOTE — Transfer of Care (Signed)
Immediate Anesthesia Transfer of Care Note  Patient: Camerin Jimenez  Procedure(s) Performed: Procedure(s): COLONOSCOPY WITH PROPOFOL (N/A) ESOPHAGOGASTRODUODENOSCOPY (EGD) WITH PROPOFOL (N/A)  Patient Location: PACU and Endoscopy Unit  Anesthesia Type:General  Level of Consciousness: drowsy and responds to stimulation  Airway & Oxygen Therapy: Patient Spontanous Breathing and Patient connected to nasal cannula oxygen  Post-op Assessment: Report given to RN and Post -op Vital signs reviewed and stable  Post vital signs: Reviewed and stable  Last Vitals:  Vitals:   08/17/16 0815 08/17/16 1152  BP: (!) 152/77 116/73  Pulse: 71 73  Resp: 18 18  Temp: 36.6 C     Last Pain:  Vitals:   08/17/16 0815  TempSrc: Tympanic         Complications: No apparent anesthesia complications

## 2016-08-17 NOTE — Anesthesia Post-op Follow-up Note (Cosign Needed)
Anesthesia QCDR form completed.        

## 2016-08-17 NOTE — Op Note (Signed)
[facility] Gastroenterology Patient Name: Brianna Harris Procedure Date: 08/17/2016 11:00 AM MRN: [id number] Account #: [id number] Date of Birth: [birth year] Admit Type: Outpatient Age: 56 Room: [facility] ENDO ROOM 1 Gender: Female Note Status: Finalized Procedure:            Colonoscopy Indications:          Screening for colorectal malignant neoplasm Providers:            Manya Silvas, MD Referring MD:         Hewitt Blade. Sarina Ser, MD (Referring MD) Medicines:            Propofol per Anesthesia Complications:        No immediate complications. Procedure:            Pre-Anesthesia Assessment:                       - After reviewing the risks and benefits, the patient                        was deemed in satisfactory condition to undergo the                        procedure.                       After obtaining informed consent, the colonoscope was                        passed under direct vision. Throughout the procedure,                        the patient's blood pressure, pulse, and oxygen                        saturations were monitored continuously. The                        Colonoscope was introduced through the anus and                        advanced to the the cecum, identified by appendiceal                        orifice and ileocecal valve. The colonoscopy was                        somewhat difficult due to restricted mobility of the                        colon. Successful completion of the procedure was aided                        by applying abdominal pressure. The patient tolerated                        the procedure well. The quality of the bowel                        preparation was excellent. Findings:      A diminutive polyp was found in the rectum. The polyp was sessile.  The       polyp was removed with a jumbo cold forceps. Resection and retrieval       were complete.      A 10 mm polyp was found in the sigmoid colon. The polyp was       semi-pedunculated. The polyp was removed with a hot snare. Resection and       retrieval were complete.      Internal hemorrhoids were found during endoscopy. The hemorrhoids were       small and Grade I (internal hemorrhoids that do not prolapse).      The exam was otherwise without abnormality. Impression:           - One diminutive polyp in the rectum, removed with a                        jumbo cold forceps. Resected and retrieved.                       - One 10 mm polyp in the sigmoid colon, removed with a                        hot snare. Resected and retrieved.                       - Internal hemorrhoids.                       - The examination was otherwise normal. Recommendation:       - Await pathology results. Avoid nuts, popcorn, seeds.                        for 2 weeks. Manya Silvas, MD 08/17/2016 11:50:27 AM This report has been signed electronically. Number of Addenda: 0 Note Initiated On: 08/17/2016 11:00 AM Scope Withdrawal Time: 0 hours 15 minutes 50 seconds  Total Procedure Duration: 0 hours 22 minutes 30 seconds       [facility]

## 2016-08-17 NOTE — H&P (Signed)
Primary Care Physician:  Madelyn Brunner, MD Primary Gastroenterologist:  Dr. Vira Agar  Pre-Procedure History & Physical: HPI:  Brianna Harris is a 55 y.o. female is here for an endoscopy and colonoscopy.   Past Medical History:  Diagnosis Date  . Asthma   . Deafness   . Depression   . Diabetes mellitus without complication ([place])   . GERD (gastroesophageal reflux disease)   . Heart abnormality    per mother pt has bad heart murmor- sees Dr Ubaldo Glassing   . Hyperlipidemia   . Hypertension     Past Surgical History:  Procedure Laterality Date  . ABDOMINAL HYSTERECTOMY  2000   per mother [facility] thought pt cancer and did hyster ,cervix and ovaries  . CHOLECYSTECTOMY      Prior to Admission medications   Medication Sig Start Date End Date Taking? Authorizing Provider  ASPIRIN 81 PO 1 tab by mouth as needed 12/05/09  Yes Historical Provider, MD  atorvastatin (LIPITOR) 40 MG tablet TAKE ONE TABLET BY MOUTH ONCE DAILY 01/25/16  Yes Historical Provider, MD  fluticasone furoate-vilanterol (BREO ELLIPTA) 100-25 MCG/INH AEPB INHALE ONE PUFF BY MOUTH ONCE DAILY 03/03/15  Yes Historical Provider, MD  hydrochlorothiazide (HYDRODIURIL) 25 MG tablet Take 25 mg by mouth daily.  05/25/15  Yes Historical Provider, MD  metFORMIN (GLUCOPHAGE) 500 MG tablet Take 500 mg by mouth 2 (two) times daily with a meal.   Yes Historical Provider, MD  metFORMIN (GLUCOPHAGE-XR) 500 MG 24 hr tablet Take 500 mg by mouth daily before supper. Take 4 tabs (2,000 mg total) by mouth daily with dinner   Yes Historical Provider, MD  omeprazole (PRILOSEC) 20 MG capsule Take 20 mg by mouth daily.  06/21/15  Yes Historical Provider, MD  albuterol (PROVENTIL HFA;VENTOLIN HFA) 108 (90 Base) MCG/ACT inhaler Inhale into the lungs. 06/25/16   Historical Provider, MD  amLODipine (NORVASC) 5 MG tablet Take 5 mg by mouth daily.  07/07/15 08/02/16  Historical Provider, MD  lisinopril (PRINIVIL,ZESTRIL) 20 MG tablet Take 20 mg by mouth daily.   07/22/15 08/02/16  Historical Provider, MD  ONE TOUCH ULTRA TEST test strip  05/08/16   Historical Provider, MD    Allergies as of 06/15/2016  . (No Known Allergies)    Family History  Problem Relation Age of Onset  . Diabetes Maternal Uncle   . Heart attack Father   . Kidney cancer Paternal Uncle   . Breast cancer Neg Hx     Social History   Social History  . Marital status: Single    Spouse name: N/A  . Number of children: N/A  . Years of education: N/A   Occupational History  . Not on file.   Social History Main Topics  . Smoking status: Current Every Day Smoker    Packs/day: 1.50    Years: 15.00    Types: Cigarettes  . Smokeless tobacco: Never Used  . Alcohol use 3.0 oz/week    5 Glasses of wine per week  . Drug use: Yes    Types: Marijuana     Comment: smoked 3 yr ago,for 2 y. Hx of cocaine use   . Sexual activity: No   Other Topics Concern  . Not on file   Social History Narrative  . No narrative on file    Review of Systems: See HPI, otherwise negative ROS  Physical Exam: BP (!) 152/77   Pulse 71   Temp 97.8 F (36.6 C) (Tympanic)   Resp 18  Ht 5\' 2"  (1.575 m)   Wt 56.7 kg (125 lb)   SpO2 100%   BMI 22.86 kg/m  General:   Alert,  pleasant and cooperative in NAD Head:  Normocephalic and atraumatic. Neck:  Supple; no masses or thyromegaly. Lungs:  Clear throughout to auscultation.    Heart:  Regular rate and rhythm. Abdomen:  Soft, nontender and nondistended. Normal bowel sounds, without guarding, and without rebound.   Neurologic:  Alert and  oriented x4;  grossly normal neurologically.  Impression/Plan: Brianna Harris is here for an endoscopy and colonoscopy to be performed for GERD and colon cancer screening.  Risks, benefits, limitations, and alternatives regarding  endoscopy and colonoscopy have been reviewed with the patient.  Questions have been answered.  All parties agreeable.   Gaylyn Cheers, MD  08/17/2016, 10:54 AM

## 2016-08-17 NOTE — Op Note (Signed)
[facility] Gastroenterology Patient Name: Brianna Harris Procedure Date: 08/17/2016 11:00 AM MRN: [id number] Account #: [id number] Date of Birth: 08-17-1960 Admit Type: Outpatient Age: 56 Room: [facility] ENDO ROOM 1 Gender: Female Note Status: Finalized Procedure:            Upper GI endoscopy Indications:          Heartburn, Suspected gastro-esophageal reflux disease Providers:            Manya Silvas, MD Referring MD:         Hewitt Blade. Sarina Ser, MD (Referring MD) Medicines:            Propofol per Anesthesia Complications:        No immediate complications. Procedure:            Pre-Anesthesia Assessment:                       - After reviewing the risks and benefits, the patient                        was deemed in satisfactory condition to undergo the                        procedure.                       After obtaining informed consent, the endoscope was                        passed under direct vision. Throughout the procedure,                        the patient's blood pressure, pulse, and oxygen                        saturations were monitored continuously. The Endoscope                        was introduced through the mouth, and advanced to the                        second part of duodenum. The upper GI endoscopy was                        accomplished without difficulty. The patient tolerated                        the procedure well. Findings:      The examined esophagus was normal. GEJ 38cm.      A small hiatal hernia was present. 2cm or so.      Diffuse mild inflammation characterized by erythema and granularity was       found in the gastric body and in the gastric antrum. Biopsies were taken       with a cold forceps for histology. Biopsies were taken with a cold       forceps for Helicobacter pylori testing.      The examined duodenum was normal. Impression:           - Normal esophagus.                       -  Small hiatal hernia.                  - Gastritis. Biopsied.                       - Normal examined duodenum. Recommendation:       - Await pathology results. Manya Silvas, MD 08/17/2016 11:21:24 AM This report has been signed electronically. Number of Addenda: 0 Note Initiated On: 08/17/2016 11:00 AM      [facility]

## 2016-08-17 NOTE — Anesthesia Postprocedure Evaluation (Signed)
Anesthesia Post Note  Patient: Brianna Harris  Procedure(s) Performed: Procedure(s) (LRB): COLONOSCOPY WITH PROPOFOL (N/A) ESOPHAGOGASTRODUODENOSCOPY (EGD) WITH PROPOFOL (N/A)  Patient location during evaluation: Endoscopy Anesthesia Type: General Level of consciousness: awake and alert Pain management: pain level controlled Vital Signs Assessment: post-procedure vital signs reviewed and stable Respiratory status: spontaneous breathing, nonlabored ventilation, respiratory function stable and patient connected to nasal cannula oxygen Cardiovascular status: blood pressure returned to baseline and stable Postop Assessment: no signs of nausea or vomiting Anesthetic complications: no     Last Vitals:  Vitals:   08/17/16 1200 08/17/16 1210  BP: 120/71 (!) 92/57  Pulse: 64 76  Resp: 13 16  Temp:      Last Pain:  Vitals:   08/17/16 1210  TempSrc:   PainSc: 0-No pain                 Martha Clan

## 2016-08-20 ENCOUNTER — Encounter: Payer: Self-pay | Admitting: Unknown Physician Specialty

## 2016-08-22 LAB — SURGICAL PATHOLOGY

## 2016-08-24 NOTE — Progress Notes (Signed)
08/27/2016 4:51 PM   Brianna Harris [birth year] [id number]  Referring provider: Madelyn Brunner, MD [place] [facility] [address], [place] [address]  Chief Complaint  Patient presents with  . Urinary Frequency    New Patient    HPI: Patient is a 56 year old Caucasian female who is referred to [place] by, Dr. Lisette Grinder for urinary frequency with her mother,  Brianna Harris.  Patient states that she has had urinary frequency for two years ago.  She has not been evaluated by urology in the past.  She has not been tried on medications or underwent PT.    Patient has incontinence with SUI and urge.   Her incontinence volume is small.   She is wearing not pads/depends daily.    She is having urinary frequency x 8, urgency x 8, incontinence x 8, dysuria, nocturia x 4-7 and engages in toilet mapping.     She is not having associated dysuria or gross hematuria.  She does not have a history of urinary tract infections, STI's or injury to the bladder.  Her uncle had bladder cancer.    She endorses an occasional suprapubic pain, but she has constant back pain x several years.    She has not had any recent fevers, chills, nausea or vomiting.   She does not have a history of nephrolithiasis, GU surgery or GU trauma.   She is not sexually active.   She is post menopausal.   She admits to diarrhea.   She is not having pain with bladder filling.    She has not had any recent imaging studies.    She is drinking  of water daily.   She is drinking two caffeinated beverages on Sundays.  She is drinking wine nightly.   She is drinking un sweet tea and diet [facility].    Her risk factors for incontinence are age, smoking, caffeine, diabetes, depression, vaginal atrophy and pelvic surgery.  She is taking diuretics and ACE inhibitors.    Her UA was unremarkable.    Past Medical History:  Diagnosis Date  . Asthma   . Deafness   . Depression   . Diabetes mellitus without  complication ([place])   . GERD (gastroesophageal reflux disease)   . Heart abnormality    per mother pt has bad heart murmor- sees Dr Ubaldo Glassing   . Hyperlipidemia   . Hypertension     Surgical History: Past Surgical History:  Procedure Laterality Date  . ABDOMINAL HYSTERECTOMY  2000   per mother [facility] thought pt cancer and did hyster ,cervix and ovaries  . CHOLECYSTECTOMY    . COLONOSCOPY WITH PROPOFOL N/A 08/17/2016   Procedure: COLONOSCOPY WITH PROPOFOL;  Surgeon: Manya Silvas, MD;  Location: [facility] ENDOSCOPY;  Service: Endoscopy;  Laterality: N/A;  . ESOPHAGOGASTRODUODENOSCOPY (EGD) WITH PROPOFOL N/A 08/17/2016   Procedure: ESOPHAGOGASTRODUODENOSCOPY (EGD) WITH PROPOFOL;  Surgeon: Manya Silvas, MD;  Location: [facility] ENDOSCOPY;  Service: Endoscopy;  Laterality: N/A;    Home Medications:  Allergies as of 08/27/2016   No Known Allergies     Medication List       Accurate as of 08/27/16  4:51 PM. Always use your most recent med list.          albuterol 108 (90 Base) MCG/ACT inhaler Commonly known as:  PROVENTIL HFA;VENTOLIN HFA Inhale into the lungs.   amLODipine 5 MG tablet Commonly known as:  NORVASC Take 5 mg by mouth daily.   ASPIRIN 81  PO 1 tab by mouth as needed   atorvastatin 40 MG tablet Commonly known as:  LIPITOR TAKE ONE TABLET BY MOUTH ONCE DAILY   BREO ELLIPTA 100-25 MCG/INH Aepb Generic drug:  fluticasone furoate-vilanterol INHALE ONE PUFF BY MOUTH ONCE DAILY   hydrochlorothiazide 25 MG tablet Commonly known as:  HYDRODIURIL Take 25 mg by mouth daily.   lisinopril 20 MG tablet Commonly known as:  PRINIVIL,ZESTRIL Take 20 mg by mouth daily.   metFORMIN 500 MG 24 hr tablet Commonly known as:  GLUCOPHAGE-XR Take 500 mg by mouth daily before supper. Take 4 tabs (2,000 mg total) by mouth daily with dinner   omeprazole 20 MG capsule Commonly known as:  PRILOSEC Take 20 mg by mouth daily.   ONE TOUCH ULTRA TEST test strip Generic drug:  glucose blood         Allergies: No Known Allergies  Family History: Family History  Problem Relation Age of Onset  . Diabetes Maternal Uncle   . Heart attack Father   . Kidney cancer Paternal Uncle   . Breast cancer Neg Hx     Social History:  reports that she has been smoking Cigarettes.  She has a 22.50 pack-year smoking history. She has never used smokeless tobacco. She reports that she drinks about 3.0 oz of alcohol per week . She reports that she uses drugs, including Marijuana.  ROS: UROLOGY Frequent Urination?: Yes Hard to postpone urination?: Yes Burning/pain with urination?: No Get up at night to urinate?: Yes Leakage of urine?: No Urine stream starts and stops?: No Trouble starting stream?: No Do you have to strain to urinate?: No Blood in urine?: No Urinary tract infection?: No Sexually transmitted disease?: No Injury to kidneys or bladder?: No Painful intercourse?: No Weak stream?: No Currently pregnant?: No Vaginal bleeding?: No Last menstrual period?: n  Gastrointestinal Nausea?: No Vomiting?: No Indigestion/heartburn?: Yes Diarrhea?: No Constipation?: No  Constitutional Fever: No Night sweats?: No Weight loss?: Yes Fatigue?: Yes  Skin Skin rash/lesions?: No Itching?: No  Eyes Blurred vision?: No Double vision?: No  Ears/Nose/Throat Sore throat?: No Sinus problems?: No  Hematologic/Lymphatic Swollen glands?: No Easy bruising?: No  Cardiovascular Leg swelling?: No Chest pain?: No  Respiratory Cough?: Yes Shortness of breath?: No  Endocrine Excessive thirst?: Yes  Musculoskeletal Back pain?: Yes Joint pain?: No  Neurological Headaches?: No Dizziness?: No  Psychologic Depression?: Yes Anxiety?: Yes  Physical Exam: BP (!) 154/82   Pulse (!) 101   Ht 5\' 2"  (1.575 m)   Wt 125 lb (56.7 kg)   BMI 22.86 kg/m   Constitutional: Well nourished. Alert and oriented, No acute distress. HEENT: [place] AT, moist mucus membranes. Trachea  midline, no masses. Cardiovascular: No clubbing, cyanosis, or edema. Respiratory: Normal respiratory effort, no increased work of breathing. GI: Abdomen is soft, non tender, non distended, no abdominal masses. Liver and spleen not palpable.  No hernias appreciated.  Stool sample for occult testing is not indicated.   GU: No CVA tenderness.  No bladder fullness or masses. Atrophic external genitalia, normal pubic hair distribution, no lesions.  Normal urethral meatus, no lesions, no prolapse, no discharge.   No urethral masses, tenderness and/or tenderness. No bladder fullness, tenderness or masses. Pale vagina mucosa, poor estrogen effect, no discharge, no lesions, good pelvic support, Grade I cystocele is noted.  Rectocele is not noted.  Cervix, uterus and adnexa are surgically.   Anus and perineum are without rashes or lesions.    Skin: No rashes, bruises or  suspicious lesions. Lymph: No cervical or inguinal adenopathy. Neurologic: Grossly intact, no focal deficits, moving all 4 extremities. Psychiatric: Normal mood and affect.  Laboratory Data: Lab Results  Component Value Date   WBC 14.5 (H) 07/19/2016   HGB 15.1 07/19/2016   HCT 43.9 07/19/2016   MCV 88.4 07/19/2016   PLT 428 07/19/2016    Urinalysis Unremarkable.  See EPIC.    Assessment & Plan:    1. Frequency  - offered behavioral therapies, bladder training, bladder control strategies and pelvic floor muscle training - patient would like to schedule  - fluid management - ENCOURAGED to eliminate diet sodas from her diet  - offered medical therapy with anticholinergic therapy or beta-3 adrenergic receptor agonist and the potential side effects of each therapy    - would like to try the beta-3 adrenergic receptor agonist (Myrbetriq).  Given Myrbetriq 25 mg samples, #28.  I have reviewed with the patient of the side effects of Myrbetriq, such as: elevation in BP, urinary retention and/or HA.    - RTC in 3 weeks for PVR and OAB  questionnaire  2. Vaginal atrophy  - I explained to the patient that when women go through menopause and her estrogen levels are severely diminished, the normal vaginal flora will change.  This is due to an increase of the vaginal canal's pH. Because of this, the vaginal canal may be colonized by bacteria from the rectum instead of the protective lactobacillus.  This accompanied by the loss of the mucus barrier with vaginal atrophy is a cause of recurrent urinary tract infections.  - I explained to the patient that her menopausal state causes her vaginal tissue to become dry, inflamed and thin and this is may contribute to her bladder irritability  - In some studies, the use of vaginal estrogen cream has been demonstrated to reduce  recurrent urinary tract infections to one a year.    - Patient was given a sample of vaginal estrogen cream (Premarin) and instructed to apply 0.5mg  (pea-sized amount)  just inside the vaginal introitus with a finger-tip every night for two weeks and then Monday, Wednesday and Friday nights.  I explained to the patient that vaginally administered estrogen, which causes only a slight increase in the blood estrogen levels, have fewer contraindications and adverse systemic effects that oral HT.  - She will follow up in three months for an exam.    3. SUI  - see above  - given hand out on Kegel exercises   Return in about 3 weeks (around 09/17/2016) for OAB questionnaire, PVR and exam.  These notes generated with voice recognition software. I apologize for typographical errors.  Zara Council, [facility] [address] [place], [place] [address] [phone]

## 2016-08-27 ENCOUNTER — Ambulatory Visit: Payer: PPO | Admitting: Urology

## 2016-08-27 ENCOUNTER — Encounter: Payer: Self-pay | Admitting: Urology

## 2016-08-27 VITALS — BP 154/82 | HR 101 | Ht 62.0 in | Wt 125.0 lb

## 2016-08-27 DIAGNOSIS — N952 Postmenopausal atrophic vaginitis: Secondary | ICD-10-CM

## 2016-08-27 DIAGNOSIS — N3281 Overactive bladder: Secondary | ICD-10-CM | POA: Insufficient documentation

## 2016-08-27 DIAGNOSIS — F1910 Other psychoactive substance abuse, uncomplicated: Secondary | ICD-10-CM | POA: Insufficient documentation

## 2016-08-27 DIAGNOSIS — N393 Stress incontinence (female) (male): Secondary | ICD-10-CM | POA: Diagnosis not present

## 2016-08-27 DIAGNOSIS — E785 Hyperlipidemia, unspecified: Secondary | ICD-10-CM | POA: Insufficient documentation

## 2016-08-27 DIAGNOSIS — R350 Frequency of micturition: Secondary | ICD-10-CM | POA: Diagnosis not present

## 2016-08-29 LAB — URINALYSIS, COMPLETE
Bilirubin, UA: NEGATIVE
Glucose, UA: NEGATIVE
Ketones, UA: NEGATIVE
Leukocytes, UA: NEGATIVE
Nitrite, UA: NEGATIVE
Protein, UA: NEGATIVE
Specific Gravity, UA: <1.005 — ABNORMAL LOW (ref 1.005–1.030)
Urobilinogen, Ur: 0.2 mg/dL (ref 0.2–1.0)
pH, UA: 5.5 (ref 5.0–7.5)

## 2016-08-29 LAB — MICROSCOPIC EXAMINATION
Bacteria, UA: NONE SEEN
RBC, UA: NONE SEEN /hpf (ref 0–?)

## 2016-09-20 ENCOUNTER — Other Ambulatory Visit: Payer: Self-pay

## 2016-09-20 ENCOUNTER — Telehealth: Payer: Self-pay

## 2016-09-20 DIAGNOSIS — N393 Stress incontinence (female) (male): Secondary | ICD-10-CM

## 2016-09-20 MED ORDER — MIRABEGRON ER 25 MG PO TB24: 25.0000 mg | ORAL_TABLET | Freq: Every day | ORAL | 0 refills | Status: DC

## 2016-09-20 NOTE — Telephone Encounter (Signed)
Pt mother called stating pt is deaf and out of myrbetriq. Mother requested medication be sent to [facility] in [place]. Per Dairl Ponder was sent but pt needs to keep f/u appt.

## 2016-09-24 ENCOUNTER — Ambulatory Visit: Payer: PPO | Admitting: Urology

## 2016-10-23 NOTE — Telephone Encounter (Signed)
done

## 2016-10-29 ENCOUNTER — Telehealth: Payer: Self-pay | Admitting: Urology

## 2016-10-29 NOTE — Telephone Encounter (Signed)
Patient called asking for another refill for myrbetriq on 09-20-16 Larene Beach gave her a refill with the understanding that she would keep her app on 09-24-16 and the patient cancelled it. She called today asking for another refill and when I told her that she could not have any until she had an app she said she didn't want any appts just medication I told her she couldn't have any she said ok never mind.  Sharyn Lull

## 2016-11-04 NOTE — Telephone Encounter (Signed)
Please call Mrs. Culpepper and explain to her that the reason it is important to have a follow up after starting a medication is to make sure it is not causing side effects.  There are some side effects that are subtle and will not have symptoms.

## 2016-11-05 NOTE — Telephone Encounter (Signed)
Patient called back per Angie and she scheduled patient a follow up appointment.

## 2016-11-05 NOTE — Telephone Encounter (Signed)
LMOM for patient to return call.

## 2016-11-27 ENCOUNTER — Inpatient Hospital Stay: Payer: PPO | Attending: Oncology | Admitting: Oncology

## 2016-11-27 ENCOUNTER — Inpatient Hospital Stay: Payer: PPO

## 2016-11-27 ENCOUNTER — Other Ambulatory Visit: Payer: Self-pay | Admitting: *Deleted

## 2016-11-27 VITALS — BP 125/79 | HR 82 | Temp 97.4°F | Resp 18 | Wt 127.2 lb

## 2016-11-27 DIAGNOSIS — F329 Major depressive disorder, single episode, unspecified: Secondary | ICD-10-CM | POA: Diagnosis not present

## 2016-11-27 DIAGNOSIS — Z7984 Long term (current) use of oral hypoglycemic drugs: Secondary | ICD-10-CM | POA: Diagnosis not present

## 2016-11-27 DIAGNOSIS — J45909 Unspecified asthma, uncomplicated: Secondary | ICD-10-CM | POA: Diagnosis not present

## 2016-11-27 DIAGNOSIS — E785 Hyperlipidemia, unspecified: Secondary | ICD-10-CM | POA: Insufficient documentation

## 2016-11-27 DIAGNOSIS — I10 Essential (primary) hypertension: Secondary | ICD-10-CM | POA: Diagnosis not present

## 2016-11-27 DIAGNOSIS — E1165 Type 2 diabetes mellitus with hyperglycemia: Secondary | ICD-10-CM | POA: Diagnosis not present

## 2016-11-27 DIAGNOSIS — D72829 Elevated white blood cell count, unspecified: Secondary | ICD-10-CM

## 2016-11-27 DIAGNOSIS — K219 Gastro-esophageal reflux disease without esophagitis: Secondary | ICD-10-CM | POA: Insufficient documentation

## 2016-11-27 DIAGNOSIS — F17210 Nicotine dependence, cigarettes, uncomplicated: Secondary | ICD-10-CM

## 2016-11-27 DIAGNOSIS — H9190 Unspecified hearing loss, unspecified ear: Secondary | ICD-10-CM | POA: Diagnosis not present

## 2016-11-27 DIAGNOSIS — D72820 Lymphocytosis (symptomatic): Secondary | ICD-10-CM

## 2016-11-27 LAB — CBC WITH DIFFERENTIAL/PLATELET
Basophils Absolute: 0.1 10*3/uL (ref 0–0.1)
Basophils Relative: 1 %
Eosinophils Absolute: 0.6 10*3/uL (ref 0–0.7)
Eosinophils Relative: 4 %
HCT: 38.3 % (ref 35.0–47.0)
Hemoglobin: 13.1 g/dL (ref 12.0–16.0)
Lymphocytes Relative: 32 %
Lymphs Abs: 4.4 10*3/uL — ABNORMAL HIGH (ref 1.0–3.6)
MCH: 28.7 pg (ref 26.0–34.0)
MCHC: 34.3 g/dL (ref 32.0–36.0)
MCV: 83.5 fL (ref 80.0–100.0)
Monocytes Absolute: 0.8 10*3/uL (ref 0.2–0.9)
Monocytes Relative: 6 %
Neutro Abs: 7.9 10*3/uL — ABNORMAL HIGH (ref 1.4–6.5)
Neutrophils Relative %: 57 %
Platelets: 376 10*3/uL (ref 150–440)
RBC: 4.58 MIL/uL (ref 3.80–5.20)
RDW: 14.9 % — ABNORMAL HIGH (ref 11.5–14.5)
WBC: 13.7 10*3/uL — ABNORMAL HIGH (ref 3.6–11.0)

## 2016-11-27 NOTE — Progress Notes (Signed)
Patient offers no complaints today. 

## 2016-11-29 NOTE — Progress Notes (Signed)
Hematology/Oncology Consult note [facility]  Telephone:([phone] [phone]  Patient Care Team: Madelyn Brunner, MD as PCP - General (Internal Medicine)   Name of the patient: Brianna Harris  [id number]  01/21/1961   Date of visit: 11/29/16  Diagnosis- leucocytosis likely reactive due to smoking  Chief complaint/ Reason for visit- routine f/u  Heme/Onc history: patient is a 56 year old female who was been sent to [place] by her PCP for evaluation of leukocytosis/lymphocytosis. Patient is deaf and requires sign language for interpreter and is here with her interpreter and family member today. . On review of her outside blood work patient has had chronic leukocytosis dating back at least to August 2015 and a white count ranges between 11-13. Most recent CBC from 06/20/2016 showed white count of 12.9, H&H of 14.8/43.9 and a platelet count of 386. Differential on the CBC mainly showed absolute lymphocytosis with lymphocyte count between 3600-4200 and the past. She has also been noted to have mild eosinophilia between 600-700 and the past. Patient does have uncontrolled diabetes but otherwise her CMP is within normal limits. Patient under family member reports unintentional weight loss of about 15-20 pounds over the last 6 months. Patient smokes about 1-1/2 packs per day and has been doing so for the last 15-20 years.   Blood work from 07/19/16 showed cbc with wbc of 14.5 with neutrophilia and lymphocytosis. Bcr abl testing was negative for cml. Peripheral flow cytometry showed no significant immunophenotypic abnormality. Absolute increases in both the anti-lymphocyte subsets are detected. No monoclonal B-cell population detected. Results support a reactive from the neoplastic process  Interval history- history obtained with the help of deaf and dumb interpretor. She is doing . Denies any complaints. She has gained back some weight. Continues to smoke  ECOG PS-  0 Pain scale- 0   Review of systems- Review of Systems  Constitutional: Negative for chills, fever, malaise/fatigue and weight loss.  HENT: Negative for congestion, ear discharge and nosebleeds.   Eyes: Negative for blurred vision.  Respiratory: Negative for cough, hemoptysis, sputum production, shortness of breath and wheezing.   Cardiovascular: Negative for chest pain, palpitations, orthopnea and claudication.  Gastrointestinal: Negative for abdominal pain, blood in stool, constipation, diarrhea, heartburn, melena, nausea and vomiting.  Genitourinary: Negative for dysuria, flank pain, frequency, hematuria and urgency.  Musculoskeletal: Negative for back pain, joint pain and myalgias.  Skin: Negative for rash.  Neurological: Negative for dizziness, tingling, focal weakness, seizures, weakness and headaches.  Endo/Heme/Allergies: Does not bruise/bleed easily.  Psychiatric/Behavioral: Negative for depression and suicidal ideas. The patient does not have insomnia.       No Known Allergies   Past Medical History:  Diagnosis Date  . Asthma   . Deafness   . Depression   . Diabetes mellitus without complication ([place])   . GERD (gastroesophageal reflux disease)   . Heart abnormality    per mother pt has bad heart murmor- sees Dr Ubaldo Glassing   . Hyperlipidemia   . Hypertension      Past Surgical History:  Procedure Laterality Date  . ABDOMINAL HYSTERECTOMY  2000   per mother [facility] thought pt cancer and did hyster ,cervix and ovaries  . CHOLECYSTECTOMY    . COLONOSCOPY WITH PROPOFOL N/A 08/17/2016   Procedure: COLONOSCOPY WITH PROPOFOL;  Surgeon: Manya Silvas, MD;  Location: [facility] ENDOSCOPY;  Service: Endoscopy;  Laterality: N/A;  . ESOPHAGOGASTRODUODENOSCOPY (EGD) WITH PROPOFOL N/A 08/17/2016   Procedure: ESOPHAGOGASTRODUODENOSCOPY (EGD) WITH PROPOFOL;  Surgeon: Manya Silvas, MD;  Location: [facility] ENDOSCOPY;  Service: Endoscopy;  Laterality: N/A;    Social History   Social History    . Marital status: Single    Spouse name: N/A  . Number of children: N/A  . Years of education: N/A   Occupational History  . Not on file.   Social History Main Topics  . Smoking status: Current Every Day Smoker    Packs/day: 1.50    Years: 15.00    Types: Cigarettes  . Smokeless tobacco: Never Used  . Alcohol use 3.0 oz/week    5 Glasses of wine per week  . Drug use: Yes    Types: Marijuana     Comment: smoked 3 yr ago,for 2 y. Hx of cocaine use   . Sexual activity: No   Other Topics Concern  . Not on file   Social History Narrative  . No narrative on file    Family History  Problem Relation Age of Onset  . Diabetes Maternal Uncle   . Heart attack Father   . Kidney cancer Paternal Uncle   . Breast cancer Neg Hx      Current Outpatient Prescriptions:  .  albuterol (PROVENTIL HFA;VENTOLIN HFA) 108 (90 Base) MCG/ACT inhaler, Inhale into the lungs., Disp: , Rfl:  .  amLODipine (NORVASC) 5 MG tablet, Take 5 mg by mouth daily. , Disp: , Rfl:  .  ASPIRIN 81 PO, 1 tab by mouth as needed, Disp: , Rfl:  .  atorvastatin (LIPITOR) 40 MG tablet, TAKE ONE TABLET BY MOUTH ONCE DAILY, Disp: , Rfl:  .  lisinopril (PRINIVIL,ZESTRIL) 20 MG tablet, Take 20 mg by mouth daily. , Disp: , Rfl:  .  metFORMIN (GLUCOPHAGE-XR) 500 MG 24 hr tablet, Take 500 mg by mouth daily before supper. Take 4 tabs (2,000 mg total) by mouth daily with dinner, Disp: , Rfl:  .  omeprazole (PRILOSEC) 20 MG capsule, Take 20 mg by mouth daily. , Disp: , Rfl:  .  ONE TOUCH ULTRA TEST test strip, , Disp: , Rfl:  .  fluticasone furoate-vilanterol (BREO ELLIPTA) 100-25 MCG/INH AEPB, INHALE ONE PUFF BY MOUTH ONCE DAILY, Disp: , Rfl:  .  hydrochlorothiazide (HYDRODIURIL) 25 MG tablet, Take 25 mg by mouth daily. , Disp: , Rfl:   Physical exam:  Vitals:   11/27/16 1457  BP: 125/79  Pulse: 82  Resp: 18  Temp: (!) 97.4 F (36.3 C)  TempSrc: Tympanic  Weight: 127 lb 4 oz (57.7 kg)   Physical Exam   Constitutional: She is oriented to person, place, and time and well-developed, well-nourished, and in no distress.  HENT:  Head: Normocephalic and atraumatic.  Eyes: Pupils are equal, round, and reactive to light. EOM are normal.  Neck: Normal range of motion.  Cardiovascular: Normal rate, regular rhythm and normal heart sounds.   Pulmonary/Chest: Effort normal and breath sounds normal.  Abdominal: Soft. Bowel sounds are normal.  Neurological: She is alert and oriented to person, place, and time.  Skin: Skin is warm and dry.     No flowsheet data found. CBC Latest Ref Rng & Units 11/27/2016  WBC 3.6 - 11.0 K/uL 13.7(H)  Hemoglobin 12.0 - 16.0 g/dL 13.1  Hematocrit 35.0 - 47.0 % 38.3  Platelets 150 - 440 K/uL 376      Assessment and plan- Patient is a 56 y.o. female referred for leucocytosis likely reactive  Wbc stable between 12-15. It waxes and wanes. Flow cytometry showed reactive process. Likely  due to smoking. rtc in 1 year with cbc /diff. inerim cbc to eb checked at 6 months   Visit Diagnosis 1. Leukocytosis, unspecified type      Dr. Randa Evens, MD, MPH [facility] Pager- [phone] 11/29/2016 8:06 AM

## 2016-12-05 NOTE — Progress Notes (Signed)
12/06/2016 4:31 PM   Brianna Harris [birth year] [id number]  Referring provider: Madelyn Brunner, MD [place] [facility] [place], [place] [address]  Chief Complaint  Patient presents with  . Follow-up    HPI: 56 yo WF who presents for a follow up for mixed incontinence, frequency and vaginal atrophy.  Background history Patient is a 56 year old Caucasian female who is referred to [place] by, Dr. Lisette Grinder for urinary frequency with her mother, Brianna Harris.  Patient states that she has had urinary frequency for two years ago.  She has not been evaluated by urology in the past.  She has not been tried on medications or underwent PT.  Patient has incontinence with SUI and urge.   Her incontinence volume is small.   She is wearing not pads/depends daily.   She is having urinary frequency x 8, urgency x 8, incontinence x 8, dysuria, nocturia x 4-7 and engages in toilet mapping.   She is not having associated dysuria or gross hematuria.  She does not have a history of urinary tract infections, STI's or injury to the bladder.  Her uncle had bladder cancer.  She endorses an occasional suprapubic pain, but she has constant back pain x several years.  She has not had any recent fevers, chills, nausea or vomiting.   She does not have a history of nephrolithiasis, GU surgery or GU trauma.  She is not sexually active.   She is post menopausal.   She admits to diarrhea. She is not having pain with bladder filling.  She has not had any recent imaging studies.  She is drinking  of water daily.   She is drinking two caffeinated beverages on Sundays.  She is drinking wine nightly.   She is drinking un sweet tea and diet [facility].  Her risk factors for incontinence are age, smoking, caffeine, diabetes, depression, vaginal atrophy and pelvic surgery.  She is taking diuretics and ACE inhibitors.  Her UA was unremarkable.    At her visit three months ago, she was started on Myrbetriq and vaginal  estrogen cream.    Today, she has been experiencing urgency x ?,  frequency x 4-7 (improved), not restricting fluids to avoid visits to the restroom, is engaging in toilet mapping, incontinence x 0-3 (improved) and nocturia x 0-3 (improved).   Her PVR is 60 mL.   She has not been using the vaginal estrogen cream three nights weekly.  She ran out of Myrbetriq samples and does not remember if they help with her urinary issues or not.      Past Medical History:  Diagnosis Date  . Asthma   . Deafness   . Depression   . Diabetes mellitus without complication ([place])   . GERD (gastroesophageal reflux disease)   . Heart abnormality    per mother pt has bad heart murmor- sees Dr Ubaldo Glassing   . Hyperlipidemia   . Hypertension     Surgical History: Past Surgical History:  Procedure Laterality Date  . ABDOMINAL HYSTERECTOMY  2000   per mother [facility] thought pt cancer and did hyster ,cervix and ovaries  . CHOLECYSTECTOMY    . COLONOSCOPY WITH PROPOFOL N/A 08/17/2016   Procedure: COLONOSCOPY WITH PROPOFOL;  Surgeon: Manya Silvas, MD;  Location: [facility] ENDOSCOPY;  Service: Endoscopy;  Laterality: N/A;  . ESOPHAGOGASTRODUODENOSCOPY (EGD) WITH PROPOFOL N/A 08/17/2016   Procedure: ESOPHAGOGASTRODUODENOSCOPY (EGD) WITH PROPOFOL;  Surgeon: Manya Silvas, MD;  Location: [facility] ENDOSCOPY;  Service:  Endoscopy;  Laterality: N/A;    Home Medications:  Allergies as of 12/06/2016   No Known Allergies     Medication List       Accurate as of 12/06/16  4:31 PM. Always use your most recent med list.          albuterol 108 (90 Base) MCG/ACT inhaler Commonly known as:  PROVENTIL HFA;VENTOLIN HFA Inhale into the lungs.   amLODipine 5 MG tablet Commonly known as:  NORVASC Take 5 mg by mouth daily.   amLODipine 5 MG tablet Commonly known as:  NORVASC TAKE 1 TABLET BY MOUTH ONCE DAILY   ASPIRIN 81 PO 1 tab by mouth as needed   atorvastatin 40 MG tablet Commonly known as:  LIPITOR TAKE ONE TABLET BY  MOUTH ONCE DAILY   BREO ELLIPTA 100-25 MCG/INH Aepb Generic drug:  fluticasone furoate-vilanterol INHALE ONE PUFF BY MOUTH ONCE DAILY   hydrochlorothiazide 25 MG tablet Commonly known as:  HYDRODIURIL Take 25 mg by mouth daily.   lisinopril 20 MG tablet Commonly known as:  PRINIVIL,ZESTRIL Take 20 mg by mouth daily.   metFORMIN 500 MG 24 hr tablet Commonly known as:  GLUCOPHAGE-XR Take 500 mg by mouth daily before supper. Take 4 tabs (2,000 mg total) by mouth daily with dinner   omeprazole 20 MG capsule Commonly known as:  PRILOSEC Take 20 mg by mouth daily.   ONE TOUCH ULTRA TEST test strip Generic drug:  glucose blood       Allergies: No Known Allergies  Family History: Family History  Problem Relation Age of Onset  . Diabetes Maternal Uncle   . Heart attack Father   . Kidney cancer Paternal Uncle   . Breast cancer Neg Hx     Social History:  reports that she has been smoking Cigarettes.  She has a 22.50 pack-year smoking history. She has never used smokeless tobacco. She reports that she drinks about 3.0 oz of alcohol per week . She reports that she uses drugs, including Marijuana.  ROS: UROLOGY Frequent Urination?: Yes Hard to postpone urination?: No Burning/pain with urination?: No Get up at night to urinate?: No Leakage of urine?: No Urine stream starts and stops?: No Trouble starting stream?: No Do you have to strain to urinate?: No Blood in urine?: No Urinary tract infection?: No Sexually transmitted disease?: No Injury to kidneys or bladder?: No Painful intercourse?: No Weak stream?: No Currently pregnant?: No Vaginal bleeding?: No Last menstrual period?: n  Gastrointestinal Nausea?: No Vomiting?: No Indigestion/heartburn?: No Diarrhea?: No Constipation?: No  Constitutional Fever: No Night sweats?: No Weight loss?: No Fatigue?: Yes  Skin Skin rash/lesions?: No Itching?: No  Eyes Blurred vision?: No Double vision?:  No  Ears/Nose/Throat Sore throat?: No Sinus problems?: No  Hematologic/Lymphatic Swollen glands?: No Easy bruising?: No  Cardiovascular Leg swelling?: No Chest pain?: No  Respiratory Cough?: No Shortness of breath?: No  Endocrine Excessive thirst?: No  Musculoskeletal Back pain?: No Joint pain?: No  Neurological Headaches?: No Dizziness?: No  Psychologic Depression?: Yes Anxiety?: No  Physical Exam: BP 140/83   Pulse 90   Ht 5\' 2"  (1.575 m)   Wt 128 lb 12.8 oz (58.4 kg)   BMI 23.56 kg/m   Constitutional: Well nourished. Alert and oriented, No acute distress. HEENT: [place] AT, moist mucus membranes. Trachea midline, no masses. Cardiovascular: No clubbing, cyanosis, or edema. Respiratory: Normal respiratory effort, no increased work of breathing. GI: Abdomen is soft, non tender, non distended, no abdominal masses. Liver and  spleen not palpable.  No hernias appreciated.  Stool sample for occult testing is not indicated.   GU: No CVA tenderness.  No bladder fullness or masses. Atrophic external genitalia, normal pubic hair distribution, no lesions.  Normal urethral meatus, no lesions, no prolapse, no discharge.   No urethral masses, tenderness and/or tenderness. No bladder fullness, tenderness or masses. Pale vagina mucosa, poor estrogen effect, no discharge, no lesions, good pelvic support, Grade I cystocele is noted.  Rectocele is not noted.  Cervix, uterus and adnexa are surgically.   Anus and perineum are without rashes or lesions.    Skin: No rashes, bruises or suspicious lesions. Lymph: No cervical or inguinal adenopathy. Neurologic: Grossly intact, no focal deficits, moving all 4 extremities. Psychiatric: Normal mood and affect.  Laboratory Data: Lab Results  Component Value Date   WBC 13.7 (H) 11/27/2016   HGB 13.1 11/27/2016   HCT 38.3 11/27/2016   MCV 83.5 11/27/2016   PLT 376 11/27/2016    I have reviewed the labs.    Assessment & Plan:    1.  Frequency  - offered behavioral therapies, bladder training, bladder control strategies and pelvic floor muscle training - patient would like to schedule  - fluid management - ENCOURAGED to eliminate diet sodas from her diet  - offered medical therapy with anticholinergic therapy or beta-3 adrenergic receptor agonist and the potential side effects of each therapy    - would like to retry the beta-3 adrenergic receptor agonist (Myrbetriq).  Given Myrbetriq 25 mg samples, #28.  I have reviewed with the patient of the side effects of Myrbetriq, such as: elevation in BP, urinary retention and/or HA.    - RTC in 3 weeks for PVR and OAB questionnaire  2. Vaginal atrophy  - encouraged her to apply the vaginal estrogen cream three nights weekly, Premarin cream sample given.  - She will follow up in three months for an exam.    3. SUI  - see above  - given hand out on Kegel exercises   Return in about 3 weeks (around 12/27/2016) for OAB questionnaire, PVR and exam.  These notes generated with voice recognition software. I apologize for typographical errors.  Zara Council, [facility] [address] [place], [place] [address] [phone]

## 2016-12-06 ENCOUNTER — Ambulatory Visit (INDEPENDENT_AMBULATORY_CARE_PROVIDER_SITE_OTHER): Payer: PPO | Admitting: Urology

## 2016-12-06 ENCOUNTER — Encounter: Payer: Self-pay | Admitting: Pharmacy Technician

## 2016-12-06 ENCOUNTER — Encounter: Payer: Self-pay | Admitting: Urology

## 2016-12-06 VITALS — BP 140/83 | HR 90 | Ht 62.0 in | Wt 128.8 lb

## 2016-12-06 DIAGNOSIS — R350 Frequency of micturition: Secondary | ICD-10-CM | POA: Diagnosis not present

## 2016-12-06 DIAGNOSIS — N393 Stress incontinence (female) (male): Secondary | ICD-10-CM

## 2016-12-06 DIAGNOSIS — N952 Postmenopausal atrophic vaginitis: Secondary | ICD-10-CM | POA: Diagnosis not present

## 2016-12-06 NOTE — Patient Outreach (Signed)
[facility]) Care Management  12/06/2016  Brianna Harris 01-07-1961 [id number]  Erroneous error

## 2016-12-21 DIAGNOSIS — J40 Bronchitis, not specified as acute or chronic: Secondary | ICD-10-CM | POA: Diagnosis not present

## 2017-01-02 NOTE — Progress Notes (Signed)
01/03/2017 4:03 PM   Brianna Harris [birth year] [id number]  Referring provider: Madelyn Brunner, MD [facility] [place], [place] [address]  Chief Complaint  Patient presents with  . Urinary Frequency    3 week follow up   . Urinary Incontinence    HPI: 56 yo WF who presents today for a 3 week follow up for mixed incontinence, frequency and vaginal atrophy.  Background history Patient is a 56 year old Caucasian female who is referred to [place] by, Dr. Lisette Grinder for urinary frequency with her mother, Brianna Harris.  Patient states that she has had urinary frequency for two years ago.  She has not been evaluated by urology in the past.  She has not been tried on medications or underwent PT.  Patient has incontinence with SUI and urge.   Her incontinence volume is small.   She is wearing not pads/depends daily.   She is having urinary frequency x 8, urgency x 8, incontinence x 8, dysuria, nocturia x 4-7 and engages in toilet mapping.   She is not having associated dysuria or gross hematuria.  She does not have a history of urinary tract infections, STI's or injury to the bladder.  Her uncle had bladder cancer.  She endorses an occasional suprapubic pain, but she has constant back pain x several years.  She has not had any recent fevers, chills, nausea or vomiting.   She does not have a history of nephrolithiasis, GU surgery or GU trauma.  She is not sexually active.   She is post menopausal.   She admits to diarrhea. She is not having pain with bladder filling.  She has not had any recent imaging studies.  She is drinking  of water daily.   She is drinking two caffeinated beverages on Sundays.  She is drinking wine nightly.   She is drinking un sweet tea and diet [facility].  Her risk factors for incontinence are age, smoking, caffeine, diabetes, depression, vaginal atrophy and pelvic surgery.  She is taking diuretics and ACE inhibitors.  Her UA was unremarkable.    At  her visit three months ago, she was started on Myrbetriq and vaginal estrogen cream.    Today, she has been experiencing urgency x 4-7,  frequency x 4-7 (stable), not restricting fluids to avoid visits to the restroom, is engaging in toilet mapping, incontinence x 0-3 (stable) and nocturia x 0-3 (stable).   Her PVR is 84 mL.     She has been using the vaginal estrogen cream three nights weekly.    Myrbetriq samples were effective.        Past Medical History:  Diagnosis Date  . Asthma   . Deafness   . Depression   . Diabetes mellitus without complication ([place])   . GERD (gastroesophageal reflux disease)   . Heart abnormality    per mother pt has bad heart murmor- sees Dr Ubaldo Glassing   . Hyperlipidemia   . Hypertension     Surgical History: Past Surgical History:  Procedure Laterality Date  . ABDOMINAL HYSTERECTOMY  2000   per mother [facility] thought pt cancer and did hyster ,cervix and ovaries  . CHOLECYSTECTOMY    . COLONOSCOPY WITH PROPOFOL N/A 08/17/2016   Procedure: COLONOSCOPY WITH PROPOFOL;  Surgeon: Manya Silvas, MD;  Location: [facility] ENDOSCOPY;  Service: Endoscopy;  Laterality: N/A;  . ESOPHAGOGASTRODUODENOSCOPY (EGD) WITH PROPOFOL N/A 08/17/2016   Procedure: ESOPHAGOGASTRODUODENOSCOPY (EGD) WITH PROPOFOL;  Surgeon: Manya Silvas, MD;  Location: [facility] ENDOSCOPY;  Service: Endoscopy;  Laterality: N/A;    Home Medications:  Allergies as of 01/03/2017   No Known Allergies     Medication List       Accurate as of 01/03/17  4:03 PM. Always use your most recent med list.          albuterol 108 (90 Base) MCG/ACT inhaler Commonly known as:  PROVENTIL HFA;VENTOLIN HFA Inhale into the lungs.   amLODipine 5 MG tablet Commonly known as:  NORVASC Take 5 mg by mouth daily.   amLODipine 5 MG tablet Commonly known as:  NORVASC TAKE 1 TABLET BY MOUTH ONCE DAILY   ASPIRIN 81 PO 1 tab by mouth as needed   atorvastatin 40 MG tablet Commonly known as:  LIPITOR TAKE ONE TABLET  BY MOUTH ONCE DAILY   BREO ELLIPTA 100-25 MCG/INH Aepb Generic drug:  fluticasone furoate-vilanterol INHALE ONE PUFF BY MOUTH ONCE DAILY   chlorpheniramine-HYDROcodone 10-8 MG/5ML Suer Commonly known as:  TUSSIONEX Take by mouth.   conjugated estrogens vaginal cream Commonly known as:  PREMARIN Place 1 Applicatorful vaginally daily. Apply 0.5mg  (pea-sized amount)  just inside the vaginal introitus with a finger-tip every night for two weeks and then Monday, Wednesday and Friday nights.   hydrochlorothiazide 25 MG tablet Commonly known as:  HYDRODIURIL Take 25 mg by mouth daily.   lisinopril 20 MG tablet Commonly known as:  PRINIVIL,ZESTRIL Take 20 mg by mouth daily.   metFORMIN 500 MG 24 hr tablet Commonly known as:  GLUCOPHAGE-XR Take 500 mg by mouth 3 (three) times daily. Take 4 tabs (2,000 mg total) by mouth daily with dinner   mirabegron ER 25 MG Tb24 tablet Commonly known as:  MYRBETRIQ Take 1 tablet (25 mg total) by mouth daily.   omeprazole 20 MG capsule Commonly known as:  PRILOSEC Take 20 mg by mouth daily.   ONE TOUCH ULTRA TEST test strip Generic drug:  glucose blood            Discharge Care Instructions        Start     Ordered   01/03/17 0000  BLADDER SCAN AMB NON-IMAGING     01/03/17 1458   01/03/17 0000  mirabegron ER (MYRBETRIQ) 25 MG TB24 tablet  Daily    Question:  Supervising Provider  Answer:  Hollice Espy   01/03/17 2376   01/03/17 0000  conjugated estrogens (PREMARIN) vaginal cream  Daily    Question:  Supervising Provider  Answer:  Hollice Espy   01/03/17 1602      Allergies: No Known Allergies  Family History: Family History  Problem Relation Age of Onset  . Diabetes Maternal Uncle   . Heart attack Father   . Kidney cancer Paternal Uncle   . Breast cancer Neg Hx   . Bladder Cancer Neg Hx     Social History:  reports that she has been smoking Cigarettes.  She has a 22.50 pack-year smoking history. She has never used  smokeless tobacco. She reports that she drinks about 3.0 oz of alcohol per week . She reports that she does not use drugs.  ROS: UROLOGY Frequent Urination?: Yes Hard to postpone urination?: No Burning/pain with urination?: No Get up at night to urinate?: No Leakage of urine?: No Urine stream starts and stops?: No Trouble starting stream?: No Do you have to strain to urinate?: No Blood in urine?: No Urinary tract infection?: No Sexually transmitted disease?: No Injury to kidneys or bladder?: No Painful intercourse?:  No Weak stream?: No Currently pregnant?: No Vaginal bleeding?: No Last menstrual period?: n  Gastrointestinal Nausea?: No Vomiting?: Yes Indigestion/heartburn?: No Diarrhea?: No Constipation?: No  Constitutional Fever: No Night sweats?: No Weight loss?: No Fatigue?: No  Skin Skin rash/lesions?: No Itching?: No  Eyes Blurred vision?: No Double vision?: No  Ears/Nose/Throat Sore throat?: No Sinus problems?: Yes  Hematologic/Lymphatic Swollen glands?: No Easy bruising?: No  Cardiovascular Leg swelling?: No Chest pain?: No  Respiratory Cough?: Yes Shortness of breath?: No  Endocrine Excessive thirst?: Yes  Musculoskeletal Back pain?: Yes Joint pain?: No  Neurological Headaches?: No Dizziness?: No  Psychologic Depression?: Yes Anxiety?: No  Physical Exam: BP 109/70   Pulse 90   Ht 5\' 2"  (1.575 m)   Wt 126 lb 1.6 oz (57.2 kg)   BMI 23.06 kg/m   Constitutional: Well nourished. Alert and oriented, No acute distress. HEENT: [place] AT, moist mucus membranes. Trachea midline, no masses. Cardiovascular: No clubbing, cyanosis, or edema. Respiratory: Normal respiratory effort, no increased work of breathing. Skin: No rashes, bruises or suspicious lesions. Lymph: No cervical or inguinal adenopathy. Neurologic: Grossly intact, no focal deficits, moving all 4 extremities. Psychiatric: Normal mood and affect.  Laboratory Data: Lab  Results  Component Value Date   WBC 13.7 (H) 11/27/2016   HGB 13.1 11/27/2016   HCT 38.3 11/27/2016   MCV 83.5 11/27/2016   PLT 376 11/27/2016    I have reviewed the labs.    Assessment & Plan:    1. Frequency  - offered behavioral therapies, bladder training, bladder control strategies and pelvic floor muscle training - patient would like to schedule  - fluid management - ENCOURAGED to eliminate diet sodas from her diet  - continue Myrbetriq 25 mg      - RTC in 3 months for PVR and OAB questionnaire  2. Vaginal atrophy  - continue to apply the vaginal estrogen cream three nights weekly, Premarin cream sample given.  - She will follow up in three months for an exam.    3. SUI  - see above  4. Tobacco abuse  - Strongly encouraged the patient to quit smoking   Return in about 3 months (around 04/04/2017) for OAB questionnaire, PVR and exam.  These notes generated with voice recognition software. I apologize for typographical errors.  Zara Council, [facility] [address] [place], [place] [address] [phone]

## 2017-01-03 ENCOUNTER — Encounter: Payer: Self-pay | Admitting: Urology

## 2017-01-03 ENCOUNTER — Ambulatory Visit (INDEPENDENT_AMBULATORY_CARE_PROVIDER_SITE_OTHER): Payer: PPO | Admitting: Urology

## 2017-01-03 VITALS — BP 109/70 | HR 90 | Ht 62.0 in | Wt 126.1 lb

## 2017-01-03 DIAGNOSIS — R350 Frequency of micturition: Secondary | ICD-10-CM | POA: Diagnosis not present

## 2017-01-03 DIAGNOSIS — Z720 Tobacco use: Secondary | ICD-10-CM | POA: Diagnosis not present

## 2017-01-03 DIAGNOSIS — N393 Stress incontinence (female) (male): Secondary | ICD-10-CM | POA: Diagnosis not present

## 2017-01-03 DIAGNOSIS — N952 Postmenopausal atrophic vaginitis: Secondary | ICD-10-CM

## 2017-01-03 LAB — BLADDER SCAN AMB NON-IMAGING: Scan Result: 84

## 2017-01-03 MED ORDER — MIRABEGRON ER 25 MG PO TB24: 25.0000 mg | ORAL_TABLET | Freq: Every day | ORAL | 11 refills | Status: DC

## 2017-01-03 MED ORDER — ESTROGENS, CONJUGATED 0.625 MG/GM VA CREA: 1.0000 | TOPICAL_CREAM | Freq: Every day | VAGINAL | 12 refills | Status: DC

## 2017-01-21 DIAGNOSIS — I10 Essential (primary) hypertension: Secondary | ICD-10-CM | POA: Diagnosis not present

## 2017-01-21 DIAGNOSIS — E119 Type 2 diabetes mellitus without complications: Secondary | ICD-10-CM | POA: Diagnosis not present

## 2017-02-15 DIAGNOSIS — E119 Type 2 diabetes mellitus without complications: Secondary | ICD-10-CM | POA: Diagnosis not present

## 2017-02-28 DIAGNOSIS — I10 Essential (primary) hypertension: Secondary | ICD-10-CM | POA: Diagnosis not present

## 2017-02-28 DIAGNOSIS — E119 Type 2 diabetes mellitus without complications: Secondary | ICD-10-CM | POA: Diagnosis not present

## 2017-02-28 DIAGNOSIS — J4520 Mild intermittent asthma, uncomplicated: Secondary | ICD-10-CM | POA: Diagnosis not present

## 2017-02-28 DIAGNOSIS — Z0000 Encounter for general adult medical examination without abnormal findings: Secondary | ICD-10-CM | POA: Diagnosis not present

## 2017-02-28 DIAGNOSIS — Z8619 Personal history of other infectious and parasitic diseases: Secondary | ICD-10-CM | POA: Diagnosis not present

## 2017-02-28 DIAGNOSIS — R260 Ataxic gait: Secondary | ICD-10-CM | POA: Diagnosis not present

## 2017-02-28 DIAGNOSIS — I350 Nonrheumatic aortic (valve) stenosis: Secondary | ICD-10-CM | POA: Diagnosis not present

## 2017-03-07 ENCOUNTER — Telehealth: Payer: Self-pay

## 2017-03-07 ENCOUNTER — Ambulatory Visit: Payer: PPO

## 2017-03-07 MED ORDER — MIRABEGRON ER 25 MG PO TB24: 25.0000 mg | ORAL_TABLET | Freq: Every day | ORAL | 11 refills | Status: DC

## 2017-03-07 NOTE — Telephone Encounter (Signed)
Pt called stating she was out of the myrbetriq 25mg  and requested a refill. Refill was given. Pt used interpreter services to communicate call.

## 2017-03-07 NOTE — Telephone Encounter (Signed)
Pt called back stating she was going to be charged $8 for myrbetriq and wanted more samples. Reinforced with pt that medication is typically $300+ and therefore getting it at $8 is a great price. Made pt aware can give a week of samples but would need to save the samples for pt just starting on the medications or insurance doesn't cover medication. Pt voiced understanding.

## 2017-03-08 ENCOUNTER — Ambulatory Visit: Payer: PPO | Attending: Urology

## 2017-03-08 ENCOUNTER — Other Ambulatory Visit: Payer: Self-pay

## 2017-03-08 DIAGNOSIS — R278 Other lack of coordination: Secondary | ICD-10-CM

## 2017-03-08 DIAGNOSIS — M6281 Muscle weakness (generalized): Secondary | ICD-10-CM | POA: Diagnosis not present

## 2017-03-08 DIAGNOSIS — N3946 Mixed incontinence: Secondary | ICD-10-CM | POA: Diagnosis not present

## 2017-03-08 NOTE — Patient Instructions (Signed)
1) Perform 5 "Quick-flicks" (squeeze muscles around the bladder one second each time) before sitting up at night, and after sitting at the side of the bed before you get up to go to the bathroom.  2) Stop drinking fluids 1-2 hours before going to bed.  3) Decrease the amount of wine you drink before bed.  4) Drink water when you drink wine to mix what is in the bladder so it will be less irritating to the bladder.

## 2017-03-08 NOTE — Therapy (Signed)
[place] MAIN [facility] SERVICES [address] [place], [place], [address] Phone: [phone]   Fax:  [phone]  Physical Therapy Evaluation  Patient Details  Name: Brianna Harris MRN: [id number] Date of Birth: 1960-06-05 Referring Provider: Zara Council   Encounter Date: 03/08/2017  PT End of Session - 03/08/17 1927    Visit Number  1    Number of Visits  10    Date for PT Re-Evaluation  05/17/17    Authorization Type  G-code    Authorization Time Period  03/08/2017-05/17/2017     Authorization - Visit Number  1    Authorization - Number of Visits  10    PT Start Time  1540    PT Stop Time  1005    PT Time Calculation (min)  68 min    Activity Tolerance  Patient tolerated treatment well;Other (comment) patient said she was "embarassed" when PF assessment over clothes performed    Behavior During Therapy  WFL for tasks assessed/performed       Past Medical History:  Diagnosis Date  . Asthma   . Deafness   . Depression   . Diabetes mellitus without complication ([place])   . GERD (gastroesophageal reflux disease)   . Heart abnormality    per mother pt has bad heart murmor- sees Dr Ubaldo Glassing   . Hyperlipidemia   . Hypertension     Past Surgical History:  Procedure Laterality Date  . ABDOMINAL HYSTERECTOMY  2000   per mother [facility] thought pt cancer and did hyster ,cervix and ovaries  . CHOLECYSTECTOMY    . COLONOSCOPY WITH PROPOFOL N/A 08/17/2016   Performed by Manya Silvas, MD at [facility]  . ESOPHAGOGASTRODUODENOSCOPY (EGD) WITH PROPOFOL N/A 08/17/2016   Performed by Manya Silvas, MD at [facility] ENDOSCOPY    There were no vitals filed for this visit.     Pelvic Floor Physical Therapy Evaluation and Assessment  SCREENING   Patient's communication preference: Sign Language  Red Flags:  Have you had any night sweats? no Unexplained weight loss? Yes, was 143, is now 133, over ~ 3 months believes it is due to  diabetes Saddle anesthesia? No  Unexplained changes in bowel or bladder habits? no  SUBJECTIVE  Patient reports: Had 1 episode of incontinence before getting to the toilet last week. This has happened in the past "many times" mostly at night. Can hold urine for up to 20 minutes during the day. Has water with dinner then begins drinking wine and drinks until shortly before bed. ~1.5 bottles of wine per night, every night.  Her blood pressure is "120/70" and her liver looks good, she was surprised Is smoking 1.5 packs per day since she was 13.  Only has incontinence at night after drinking wine. Bladder Sx. have improved because of new "medication for urination"  Precautions:  deafness  Social/Family/Vocational History:   Deaf, on disability, is going to try to look for a job after the first of the year, is working with vocational rehab.  Obstetrical History: None, hysterectomy when~56 y/o  Gynecological History: Denies burning with urination and history of STI  Urinary History: Only has issues holding urine in the evening after drinking wine.  Gastrointestinal History: Sometimes constipation ~2xper mo.  Usually has normal BM at least 1x/day  Sexual activity/pain: Not sexually active  Location of pain: LBP, centrally Current pain:  2/10  Max pain:  5/10 Least pain:  0/10 Nature of pain: dull  Aggs: prolonged sitting Eases: being active  Patient Goals: To learn how to decrease her issues with urination.   OBJECTIVE  Posture/Observations:  Sitting: slouched, posterior pelvic tilt, forward shoulders Standing: forward rolled shoulders, slightly ER of feet  Pelvic Floor External Exam: Palpation performed over clothing in side-lying Palpation: patient able to perform weak PF contraction inconsistently with poor coordination noted and accessory muscle use.   Internal Vaginal Exam: Postponed indefinitely due to patient presentation and discomfort  Strength (PERF):   Symmetry: Palpation: Prolapse:   Gait Analysis: Patient demonstrated short stride length, some  Scissoring, decreased gait speed and unsteadiness with direction changes, further assessment warranted.   Pelvic Floor Outcome Measures: PFIQ and PFDI to be returned at next visit.  Treatment: Self-Care: education on bladder irritants, etc. See Pt. Instructions. Neuro Re-Ed: worked on PF coordination with breathing to improve patient body awareness, allow for implementation of urge suppression, and set stage for future strengtheng training.     [facility] PT Assessment - 03/08/17 0001      Assessment   Medical Diagnosis  Stress Urinary Incontinenece    Referring Provider  Zara Council    Next MD Visit  May 31 2017    Prior Therapy  medication      Precautions   Precautions  None      Restrictions   Weight Bearing Restrictions  No      Balance Screen   Has the patient fallen in the past 6 months  No      Twin residence    Type of [place] to enter    Entrance Stairs-Number of Steps  8    Entrance Stairs-Rails  Left    Home Layout  Two level    Alternate Level Stairs-Number of Steps  8      Prior Function   Level of Independence  Independent with basic ADLs    Vocation  On disability    Vocation Requirements  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, hoping to start a job in January      Cognition   Overall Cognitive Status  Difficult to assess    Difficult to assess due to  Hard of hearing/deaf    Attention  Focused    Focused Attention  Appears intact    Problem Solving  -- does not appear confident in own reasoning abilities      Ambulation/Gait   Ambulation Surface  Level    Gait velocity  slow    Gait Comments  Patient uses stepping strategy and reaches out to regain balance change in direction while walking      Balance   Balance Assessed  No patient reported as poor, confirmed with gait  observation             Objective measurements completed on examination: See above findings.              PT Education - 03/08/17 1920    Education provided  Yes    Education Details  Educated on the structure and function of the pelvic floor and how it can contribute to incontinence problems. Educated on alcohol as a bladder irritant and the need to decrease consumption with more time prior to sleep as well as to increase water consumption to dilute the urine. Educated on "quick-flicks" to decrease urge, improve bladder support, and allow time to get to the bathroom. Educated on the role of  the pelvic floor with balance and how she would benefit from balance training for greater confidence and decreased fall risk.    Person(s) Educated  Patient    Methods  Explanation;Demonstration;Tactile cues;Handout interpreter used for sign-language translation of ideas    Comprehension  Verbalized understanding;Tactile cues required;Need further instruction stated that she would have her friend help her understand better. It will be beneficial to have her friend in the room for future sessions if possible to improve understanding.       PT Short Term Goals - 03/08/17 1934      PT SHORT TERM GOAL #1   Title  Patient will consistently demonstrate appropriate coordination of PF contraction with breathing to facilitate further strengthening.    Time  3    Period  Weeks    Status  New    Target Date  03/29/17      PT SHORT TERM GOAL #2   Title  Patient will report cessation of beverage consumption 2 hours before going to sleep at lease 3 nights per week over prior week to demonstrate understanding and implementation of education about bladder health.    Time  5    Period  Weeks    Status  New    Target Date  04/12/17      PT SHORT TERM GOAL #3   Title  Patient will perform a standardized balance assessment for further POC development     Time  2    Period  Weeks    Status  New     Target Date  03/22/17        PT Long Term Goals - 03/08/17 1940      PT LONG TERM GOAL #1   Title  Patient will report no episodes of incontinence over the past 2 weeks to demonstrate improved function    Time  10    Period  Weeks    Status  New    Target Date  05/17/17      PT LONG TERM GOAL #2   Title  Patient will improve ____ balance assessment by ______ points to demonstrate improved balance and decreased risk for falls.    Time  10    Period  Weeks    Status  New    Target Date  05/17/17      PT LONG TERM GOAL #3   Title  Patient will describe a reduction in alcohol consumption by %50 to demonstrate commitment to her own health and understanding of education on bladder irritants and incontinence.    Time  10    Period  Weeks    Status  New    Target Date  05/17/17             Plan - 03/08/17 1028    Clinical Impression Statement  Patient has functional incontinence, LBP, and balance deficits secondary to poor muscular awareness and coordination, weakness, and lack of knowledge of appropriate bladder health behavior. Patient will benefit from skilled pelvic PT aimed at patient education about behavioral modifications as well as coordination training, balance training, and strengthening.    History and Personal Factors relevant to plan of care:  drinks 1.5 bottles of wine per night. Smokes 1.5 packs of ciggarettes per day since age 74. Deaf. Currently on Disability.    Clinical Presentation  Stable    Rehab Potential  Fair    Clinical Impairments Affecting Rehab Potential  Long history of alcohol consumption and smoking  PT Frequency  1x / week    PT Duration  Other (comment) 10 weeks    PT Treatment/Interventions  ADLs/Self Care Home Management;Biofeedback;Gait training;Functional mobility training;Therapeutic activities;Therapeutic exercise;Balance training;Patient/family education;Neuromuscular re-education;Manual techniques    PT Next Visit Plan  perform  breathing in mod. quad. with TA and PF contraction, review kegels with breathing, sit-to-stand, supine to sit.    PT Home Exercise Plan  quick flicks    Consulted and Agree with Plan of Care  Patient    Family Member Consulted  Long-time friend whom she lives with, name given but not recorded, will collect at next visit.       Patient will benefit from skilled therapeutic intervention in order to improve the following deficits and impairments:  Abnormal gait, Decreased cognition, Improper body mechanics, Pain, Decreased coordination, Postural dysfunction, Decreased strength, Decreased balance, Difficulty walking, Other (comment)(Decreased knowledge of bladder health practices)  Visit Diagnosis: Other lack of coordination  Muscle weakness (generalized)  Mixed incontinence  G-Codes - 04/06/17 1143    Functional Assessment Tool Used (Outpatient Only)  Clinical judjement, PFDI, PFIQ    Functional Limitation  Changing and maintaining body position;Other PT subsequent    Changing and Maintaining Body Position Current Status [phone])  At least 20 percent but less than 40 percent impaired, limited or restricted    Changing and Maintaining Body Position Goal Status (C1660)  At least 1 percent but less than 20 percent impaired, limited or restricted    Other PT Secondary Current Status (Y3016)  At least 20 percent but less than 40 percent impaired, limited or restricted    Other PT Secondary Goal Status (W1093)  At least 1 percent but less than 20 percent impaired, limited or restricted        Problem List Patient Active Problem List   Diagnosis Date Noted  . Overactive bladder 08/27/2016  . Hyperlipidemia, unspecified 08/27/2016  . Drug abuse ([place]) 08/27/2016  . Aortic valve stenosis 08/03/2016  . Asthma without status asthmaticus 07/19/2016  . Diabetes mellitus type 2, uncomplicated ([place]) 23/55/7322  . Elevated LFTs 05/21/2016  . Colon cancer screening 05/21/2016  . Alcoholism /alcohol  abuse ([place]) 05/21/2016  . Lipid disorder 09/16/2011  . Chronic depression 09/12/2011  . Tobacco abuse 09/12/2011  . OAB (overactive bladder) 09/12/2011  . Crack cocaine use 09/12/2011  . Back pain 09/12/2011  . Asthma, moderate persistent 09/12/2011  . Deafness 05/01/2011  . Hypertension 05/01/2011  . GERD (gastroesophageal reflux disease) 05/01/2011   Willa Rough DPT, ATC  Willa Rough April 06, 2017, 7:46 PM  [place] MAIN [facility] SERVICES [address] [place], [place], [address] Phone: [phone]   Fax:  [phone]  Name: Romanita Fager MRN: [id number] Date of Birth: 02-Jan-1961

## 2017-03-22 ENCOUNTER — Ambulatory Visit: Payer: PPO

## 2017-03-25 DIAGNOSIS — R42 Dizziness and giddiness: Secondary | ICD-10-CM | POA: Diagnosis not present

## 2017-03-25 DIAGNOSIS — R2689 Other abnormalities of gait and mobility: Secondary | ICD-10-CM | POA: Diagnosis not present

## 2017-03-29 ENCOUNTER — Ambulatory Visit: Payer: PPO | Attending: Urology

## 2017-03-29 DIAGNOSIS — N3946 Mixed incontinence: Secondary | ICD-10-CM | POA: Insufficient documentation

## 2017-03-29 DIAGNOSIS — R2689 Other abnormalities of gait and mobility: Secondary | ICD-10-CM | POA: Insufficient documentation

## 2017-03-29 DIAGNOSIS — R278 Other lack of coordination: Secondary | ICD-10-CM | POA: Insufficient documentation

## 2017-03-29 DIAGNOSIS — M6281 Muscle weakness (generalized): Secondary | ICD-10-CM | POA: Insufficient documentation

## 2017-04-03 ENCOUNTER — Other Ambulatory Visit: Payer: Self-pay | Admitting: Neurology

## 2017-04-03 DIAGNOSIS — R42 Dizziness and giddiness: Secondary | ICD-10-CM

## 2017-04-04 ENCOUNTER — Ambulatory Visit: Payer: PPO | Admitting: Urology

## 2017-04-05 ENCOUNTER — Ambulatory Visit: Payer: PPO

## 2017-04-05 DIAGNOSIS — N3946 Mixed incontinence: Secondary | ICD-10-CM

## 2017-04-05 DIAGNOSIS — R2689 Other abnormalities of gait and mobility: Secondary | ICD-10-CM | POA: Diagnosis not present

## 2017-04-05 DIAGNOSIS — R278 Other lack of coordination: Secondary | ICD-10-CM | POA: Diagnosis not present

## 2017-04-05 DIAGNOSIS — M6281 Muscle weakness (generalized): Secondary | ICD-10-CM | POA: Diagnosis not present

## 2017-04-05 NOTE — Therapy (Signed)
[place] MAIN [facility] SERVICES [address] [place], [place], [address] Phone: [phone]   Fax:  [phone]  Physical Therapy Treatment  Patient Details  Name: Brianna Harris MRN: [id number] Date of Birth: August 20, 1960 Referring Provider: Zara Council   Encounter Date: 04/05/2017  PT End of Session - 04/05/17 1359    Visit Number  2    Number of Visits  10    Date for PT Re-Evaluation  05/17/17    Authorization Type  G-code    Authorization Time Period  03/08/2017-05/17/2017     Authorization - Visit Number  2    Authorization - Number of Visits  10    PT Start Time  3818    PT Stop Time  2993    PT Time Calculation (min)  60 min    Activity Tolerance  Patient tolerated treatment well;Other (comment)    Behavior During Therapy  WFL for tasks assessed/performed       Past Medical History:  Diagnosis Date  . Asthma   . Deafness   . Depression   . Diabetes mellitus without complication ([place])   . GERD (gastroesophageal reflux disease)   . Heart abnormality    per mother pt has bad heart murmor- sees Dr Ubaldo Glassing   . Hyperlipidemia   . Hypertension     Past Surgical History:  Procedure Laterality Date  . ABDOMINAL HYSTERECTOMY  2000   per mother [facility] thought pt cancer and did hyster ,cervix and ovaries  . CHOLECYSTECTOMY    . COLONOSCOPY WITH PROPOFOL N/A 08/17/2016   Procedure: COLONOSCOPY WITH PROPOFOL;  Surgeon: Manya Silvas, MD;  Location: [facility] ENDOSCOPY;  Service: Endoscopy;  Laterality: N/A;  . ESOPHAGOGASTRODUODENOSCOPY (EGD) WITH PROPOFOL N/A 08/17/2016   Procedure: ESOPHAGOGASTRODUODENOSCOPY (EGD) WITH PROPOFOL;  Surgeon: Manya Silvas, MD;  Location: [facility] ENDOSCOPY;  Service: Endoscopy;  Laterality: N/A;    There were no vitals filed for this visit.    Pelvic Floor Physical Therapy Treatment Note    SUBJECTIVE  Patient reports: She has been drinking her wine and then drinking water, using the bathroom and  then going to bed and has not had any episodes of nighttime incontinence over the last week.    Pain update: Not in pain currently  Patient Goals: Improve balance, decrease back pain    OBJECTIVE  Changes in: Posture/Observations:  L [place] is High in standing R leg appears longer in supine and long-sitting.  R shoulder high in standing and sitting  Range of Motion/Flexibilty:  Was able to react toes with forward bend R thoracic, L lumbar curve noted.    INTERVENTIONS THIS SESSION: Manual: PA to thoracic spine for improved mobility and decreased kyphosis, assisted L thoracic rotation for improved thoracic mobility and improved alignment.   Therex: Educated on an practiced standing side stretch with counter top for support. Educated on and practiced diaphragmatic breathing in hook-lying and seated. Educated on and practiced standing shoulder retractions against a wall for improved posture.   Total time: 60 min.                      PT Education - 04/05/17 0930    Education provided  Yes    Education Details  Patient educated on findigs of physical evaluation being some scoliosis and how that can impact the low back and pelvic floor. Educated on HEP and how each exercise will help her as well as the importancw of her performing  them regularly.    Person(s) Educated  Patient    Methods  Explanation via translator    Comprehension  Verbalized understanding via Optometrist for sign language.       PT Short Term Goals - 03/08/17 1934      PT SHORT TERM GOAL #1   Title  Patient will consistently demonstrate appropriate coordination of PF contraction with breathing to facilitate further strengthening.    Time  3    Period  Weeks    Status  New    Target Date  03/29/17      PT SHORT TERM GOAL #2   Title  Patient will report cessation of beverage consumption 2 hours before going to sleep at lease 3 nights per week over prior week to demonstrate understanding  and implementation of education about bladder health.    Time  5    Period  Weeks    Status  New    Target Date  04/12/17      PT SHORT TERM GOAL #3   Title  Patient will perform a standardized balance assessment for further POC development     Time  2    Period  Weeks    Status  New    Target Date  03/22/17        PT Long Term Goals - 03/08/17 1940      PT LONG TERM GOAL #1   Title  Patient will report no episodes of incontinence over the past 2 weeks to demonstrate improved function    Time  10    Period  Weeks    Status  New    Target Date  05/17/17      PT LONG TERM GOAL #2   Title  Patient will improve ____ balance assessment by ______ points to demonstrate improved balance and decreased risk for falls.    Time  10    Period  Weeks    Status  New    Target Date  05/17/17      PT LONG TERM GOAL #3   Title  Patient will describe a reduction in alcohol consumption by %50 to demonstrate commitment to her own health and understanding of education on bladder irritants and incontinence.    Time  10    Period  Weeks    Status  New    Target Date  05/17/17            Plan - 04/05/17 1400    Clinical Impression Statement  Patient was able to successfully implement behavioral modifications to decrease instances of nightime incontinence but will continue to benefit from skilled PT to address musculaoskeletal imbalances that are contributing to poor balance and LBP.    History and Personal Factors relevant to plan of care:  Drinks 1.5 bottles of wine and smokes 1.5 packs of cigerettes per day, hearing impaired    Clinical Presentation  Stable    Clinical Decision Making  Moderate    Rehab Potential  Fair    Clinical Impairments Affecting Rehab Potential  Long history of alcohol consumption and smoking, has had one last min. cancel and one no-show, describes self as "lazy"    PT Frequency  1x / week    PT Duration  Other (comment)    PT Treatment/Interventions  ADLs/Self  Care Home Management;Biofeedback;Gait training;Functional mobility training;Therapeutic activities;Therapeutic exercise;Balance training;Patient/family education;Neuromuscular re-education;Manual techniques    PT Next Visit Plan  perform breathing in mod. quad. with TA and PF contraction,  review kegels with breathing, sit-to-stand, supine to sit.    PT Home Exercise Plan  quick flicks, shoulder retraction against wall, side-bend stretch in standing, diaphragmatic breathing    Consulted and Agree with Plan of Care  Patient       Patient will benefit from skilled therapeutic intervention in order to improve the following deficits and impairments:  Abnormal gait, Decreased cognition, Improper body mechanics, Pain, Decreased coordination, Postural dysfunction, Decreased strength, Decreased balance, Difficulty walking, Other (comment)  Visit Diagnosis: Other lack of coordination  Muscle weakness (generalized)  Mixed incontinence  Balance problem     Problem List Patient Active Problem List   Diagnosis Date Noted  . Overactive bladder 08/27/2016  . Hyperlipidemia, unspecified 08/27/2016  . Drug abuse ([place]) 08/27/2016  . Aortic valve stenosis 08/03/2016  . Asthma without status asthmaticus 07/19/2016  . Diabetes mellitus type 2, uncomplicated ([place]) 19/14/7829  . Elevated LFTs 05/21/2016  . Colon cancer screening 05/21/2016  . Alcoholism /alcohol abuse ([place]) 05/21/2016  . Lipid disorder 09/16/2011  . Chronic depression 09/12/2011  . Tobacco abuse 09/12/2011  . OAB (overactive bladder) 09/12/2011  . Crack cocaine use 09/12/2011  . Back pain 09/12/2011  . Asthma, moderate persistent 09/12/2011  . Deafness 05/01/2011  . Hypertension 05/01/2011  . GERD (gastroesophageal reflux disease) 05/01/2011   Willa Rough DPT, ATC Willa Rough 04/06/2017, 2:04 PM  [place] MAIN [facility] SERVICES [address] [place], [place], [address] Phone:  [phone]   Fax:  [phone]  Name: Brianna Harris MRN: [id number] Date of Birth: February 15, 1961

## 2017-04-05 NOTE — Patient Instructions (Signed)
Stabilization: Diaphragmatic Breathing    Lie with knees bent, feet flat. Place one hand on stomach, other on chest. Breathe deeply through nose, lifting belly hand without any motion of hand on chest. Repeat _10_ times per set. Do __2__ sets per session. Do 1 per day.    Pull shoulders against wall and breathe deeply     Put the hand that is not over your head on a counter top to help with balance. Take deep breaths. 10 breaths on each side (1 minute) in the morning and at night.

## 2017-04-07 NOTE — Progress Notes (Signed)
04/09/2017 2:34 PM   Brianna Harris December 06, 1960 [id number]  Referring provider: Madelyn Brunner, MD No address on file  Chief Complaint  Patient presents with  . Urinary Frequency    HPI: 56 yo WF who presents today for a 3 month follow up for mixed incontinence, frequency and vaginal atrophy with interpreter, Dana Allan.    Background history Patient is a 56 year old Caucasian female who is referred to [place] by, Dr. Lisette Grinder for urinary frequency with her mother, [name].  Patient states that she has had urinary frequency for two years ago.  She has not been evaluated by urology in the past.  She has not been tried on medications or underwent PT.  Patient has incontinence with SUI and urge.   Her incontinence volume is small.   She is wearing not pads/depends daily.   She is having urinary frequency x 8, urgency x 8, incontinence x 8, dysuria, nocturia x 4-7 and engages in toilet mapping.   She is not having associated dysuria or gross hematuria.  She does not have a history of urinary tract infections, STI's or injury to the bladder.  Her uncle had bladder cancer.  She endorses an occasional suprapubic pain, but she has constant back pain x several years.  She has not had any recent fevers, chills, nausea or vomiting.   She does not have a history of nephrolithiasis, GU surgery or GU trauma.  She is not sexually active.   She is post menopausal.   She admits to diarrhea. She is not having pain with bladder filling.  She has not had any recent imaging studies.  She is drinking  of water daily.   She is drinking two caffeinated beverages on Sundays.  She is drinking wine nightly.   She is drinking un sweet tea and diet [facility].  Her risk factors for incontinence are age, smoking, caffeine, diabetes, depression, vaginal atrophy and pelvic surgery.  She is taking diuretics and ACE inhibitors.  Her UA was unremarkable.    At her visit in 08/2016 she was started on Myrbetriq and vaginal  estrogen cream.    At her visit on 12/06/2016, she had been experiencing urgency x ?,  frequency x 4-7 (improved), not restricting fluids to avoid visits to the restroom, is engaging in toilet mapping, incontinence x 0-3 (improved) and nocturia x 0-3 (improved).   Her PVR was 60 mL.   She has not been using the vaginal estrogen cream three nights weekly.  She ran out of Myrbetriq samples and does not remember if they help with her urinary issues or not.  She was restarted on Myrbetriq at that visit.    At her visit on 01/03/2017, she was experiencing urgency x 4-7,  frequency x 4-7 (stable), not restricting fluids to avoid visits to the restroom, is engaging in toilet mapping, incontinence x 0-3 (stable) and nocturia x 0-3 (stable).   Her PVR was 84 mL.   She has been using the vaginal estrogen cream three nights weekly.  Myrbetriq samples were effective.      Today, she is experiencing urgency x 0-3 (improved), frequency x 0-3 (improved), not restricting fluids to avoid visits to the restroom, is engaging in toilet mapping, incontinence x 0-3 (stable) and nocturia x 0-3 (stable).   Her PVR is 94 mL.  Her BP is 124/82.  She denies dysuria, gross hematuria or suprapubic pain.  She has not had any recent fevers, chills, nausea or vomiting.  She has also begun PT.   She is not using the vaginal estrogen cream.    Her mother states that she has been drinking two to three glasses of wine at night and not eating.      Past Medical History:  Diagnosis Date  . Asthma   . Deafness   . Depression   . Diabetes mellitus without complication ([place])   . GERD (gastroesophageal reflux disease)   . Heart abnormality    per mother pt has bad heart murmor- sees Dr Ubaldo Glassing   . Hyperlipidemia   . Hypertension     Surgical History: Past Surgical History:  Procedure Laterality Date  . ABDOMINAL HYSTERECTOMY  2000   per mother [facility] thought pt cancer and did hyster ,cervix and ovaries  . CHOLECYSTECTOMY    .  COLONOSCOPY WITH PROPOFOL N/A 08/17/2016   Procedure: COLONOSCOPY WITH PROPOFOL;  Surgeon: Manya Silvas, MD;  Location: [facility] ENDOSCOPY;  Service: Endoscopy;  Laterality: N/A;  . ESOPHAGOGASTRODUODENOSCOPY (EGD) WITH PROPOFOL N/A 08/17/2016   Procedure: ESOPHAGOGASTRODUODENOSCOPY (EGD) WITH PROPOFOL;  Surgeon: Manya Silvas, MD;  Location: [facility] ENDOSCOPY;  Service: Endoscopy;  Laterality: N/A;    Home Medications:  Allergies as of 04/09/2017   No Known Allergies     Medication List        Accurate as of 04/09/17  2:34 PM. Always use your most recent med list.          albuterol 108 (90 Base) MCG/ACT inhaler Commonly known as:  PROVENTIL HFA;VENTOLIN HFA Inhale into the lungs.   amLODipine 5 MG tablet Commonly known as:  NORVASC Take 5 mg by mouth daily.   amLODipine 5 MG tablet Commonly known as:  NORVASC TAKE 1 TABLET BY MOUTH ONCE DAILY   ASPIRIN 81 PO 1 tab by mouth as needed   atorvastatin 40 MG tablet Commonly known as:  LIPITOR TAKE ONE TABLET BY MOUTH ONCE DAILY   BREO ELLIPTA 100-25 MCG/INH Aepb Generic drug:  fluticasone furoate-vilanterol INHALE ONE PUFF BY MOUTH ONCE DAILY   chlorpheniramine-HYDROcodone 10-8 MG/5ML Suer Commonly known as:  TUSSIONEX Take by mouth.   conjugated estrogens vaginal cream Commonly known as:  PREMARIN Place 1 Applicatorful vaginally daily. Apply 0.5mg  (pea-sized amount)  just inside the vaginal introitus with a finger-tip every night for two weeks and then Monday, Wednesday and Friday nights.   hydrochlorothiazide 25 MG tablet Commonly known as:  HYDRODIURIL Take 25 mg by mouth daily.   lisinopril 20 MG tablet Commonly known as:  PRINIVIL,ZESTRIL Take 20 mg by mouth daily.   metFORMIN 500 MG 24 hr tablet Commonly known as:  GLUCOPHAGE-XR Take 500 mg by mouth 3 (three) times daily. Take 4 tabs (2,000 mg total) by mouth daily with dinner   mirabegron ER 25 MG Tb24 tablet Commonly known as:  MYRBETRIQ Take 1  tablet (25 mg total) by mouth daily.   omeprazole 20 MG capsule Commonly known as:  PRILOSEC Take 20 mg by mouth daily.   ONE TOUCH ULTRA TEST test strip Generic drug:  glucose blood       Allergies: No Known Allergies  Family History: Family History  Problem Relation Age of Onset  . Diabetes Maternal Uncle   . Heart attack Father   . Kidney cancer Paternal Uncle   . Breast cancer Neg Hx   . Bladder Cancer Neg Hx     Social History:  reports that she has been smoking cigarettes.  She has a 22.50 pack-year smoking history.  she has never used smokeless tobacco. She reports that she drinks about 3.0 oz of alcohol per week. She reports that she does not use drugs.  ROS: UROLOGY Frequent Urination?: No Hard to postpone urination?: No Burning/pain with urination?: No Get up at night to urinate?: No Leakage of urine?: No Urine stream starts and stops?: No Trouble starting stream?: No Do you have to strain to urinate?: No Blood in urine?: No Urinary tract infection?: No Sexually transmitted disease?: No Injury to kidneys or bladder?: No Painful intercourse?: No Weak stream?: No Currently pregnant?: No Vaginal bleeding?: No Last menstrual period?: N  Gastrointestinal Nausea?: No Vomiting?: No Indigestion/heartburn?: No Diarrhea?: No Constipation?: No  Constitutional Fever: No Night sweats?: No Weight loss?: Yes Fatigue?: No  Skin Skin rash/lesions?: No Itching?: No  Eyes Blurred vision?: No Double vision?: No  Ears/Nose/Throat Sore throat?: No Sinus problems?: No  Hematologic/Lymphatic Swollen glands?: No Easy bruising?: No  Cardiovascular Leg swelling?: No Chest pain?: No  Respiratory Cough?: Yes Shortness of breath?: Yes  Endocrine Excessive thirst?: No  Musculoskeletal Back pain?: No Joint pain?: No  Neurological Headaches?: No Dizziness?: No  Psychologic Depression?: Yes Anxiety?: No  Physical Exam: BP 122/82   Pulse 86    Ht 5\' 2"  (1.575 m)   Wt 124 lb 6.4 oz (56.4 kg)   BMI 22.75 kg/m   Constitutional: Well nourished. Alert and oriented, No acute distress. HEENT: [place] AT, moist mucus membranes. Trachea midline, no masses. Cardiovascular: No clubbing, cyanosis, or edema. Respiratory: Normal respiratory effort, no increased work of breathing. GI: Abdomen is soft, non tender, non distended, no abdominal masses.  Skin: No rashes, bruises or suspicious lesions. Lymph: No cervical or inguinal adenopathy. Neurologic: Grossly intact, no focal deficits, moving all 4 extremities. Psychiatric: Normal mood and affect.  Laboratory Data: Lab Results  Component Value Date   WBC 13.7 (H) 11/27/2016   HGB 13.1 11/27/2016   HCT 38.3 11/27/2016   MCV 83.5 11/27/2016   PLT 376 11/27/2016    I have reviewed the labs.    Assessment & Plan:    1. Frequency  - finding Myrbetriq effective and has started PT - refills for Myrbetriq given   - RTC in one year for OAB questionnaire and PVR  2. Vaginal atrophy  - she does not want to use the vaginal estrogen cream  3. SUI  - she states that she is not having incontinence  - RTC in one year   4. Heavy alcohol abuse  - patient is not interested in quitting or reducing her intake at this time.    Return in about 1 year (around 04/09/2018) for PVR and OAB questionnaire.  These notes generated with voice recognition software. I apologize for typographical errors.  Zara Council, [facility] [address] [place], [place] [address] [phone]

## 2017-04-09 ENCOUNTER — Ambulatory Visit: Payer: PPO | Admitting: Urology

## 2017-04-09 ENCOUNTER — Encounter: Payer: Self-pay | Admitting: Urology

## 2017-04-09 ENCOUNTER — Other Ambulatory Visit: Payer: Self-pay

## 2017-04-09 VITALS — BP 122/82 | HR 86 | Ht 62.0 in | Wt 124.4 lb

## 2017-04-09 DIAGNOSIS — R350 Frequency of micturition: Secondary | ICD-10-CM | POA: Diagnosis not present

## 2017-04-09 DIAGNOSIS — N952 Postmenopausal atrophic vaginitis: Secondary | ICD-10-CM | POA: Diagnosis not present

## 2017-04-09 DIAGNOSIS — N393 Stress incontinence (female) (male): Secondary | ICD-10-CM | POA: Diagnosis not present

## 2017-04-09 LAB — BLADDER SCAN AMB NON-IMAGING

## 2017-04-09 MED ORDER — MIRABEGRON ER 25 MG PO TB24: 25.0000 mg | ORAL_TABLET | Freq: Every day | ORAL | 3 refills | Status: DC

## 2017-04-10 ENCOUNTER — Ambulatory Visit
Admission: RE | Admit: 2017-04-10 | Discharge: 2017-04-10 | Disposition: A | Discharge status: Home / Self Care | Payer: PPO | Source: Ambulatory Visit | Attending: Neurology | Admitting: Neurology

## 2017-04-10 DIAGNOSIS — I6381 Other cerebral infarction due to occlusion or stenosis of small artery: Secondary | ICD-10-CM | POA: Insufficient documentation

## 2017-04-10 DIAGNOSIS — R42 Dizziness and giddiness: Secondary | ICD-10-CM

## 2017-04-10 DIAGNOSIS — I6782 Cerebral ischemia: Secondary | ICD-10-CM | POA: Diagnosis not present

## 2017-04-10 DIAGNOSIS — I6523 Occlusion and stenosis of bilateral carotid arteries: Secondary | ICD-10-CM | POA: Diagnosis not present

## 2017-04-10 DIAGNOSIS — R2689 Other abnormalities of gait and mobility: Secondary | ICD-10-CM | POA: Diagnosis present

## 2017-04-12 ENCOUNTER — Ambulatory Visit: Payer: PPO

## 2017-04-12 DIAGNOSIS — R2689 Other abnormalities of gait and mobility: Secondary | ICD-10-CM

## 2017-04-12 DIAGNOSIS — R278 Other lack of coordination: Secondary | ICD-10-CM | POA: Diagnosis not present

## 2017-04-12 DIAGNOSIS — N3946 Mixed incontinence: Secondary | ICD-10-CM

## 2017-04-12 DIAGNOSIS — M6281 Muscle weakness (generalized): Secondary | ICD-10-CM

## 2017-04-12 NOTE — Therapy (Signed)
[place] MAIN [facility] SERVICES [address] [place], [place], [address] Phone: [phone]   Fax:  [phone]  Physical Therapy Treatment  Patient Details  Name: Brianna Harris MRN: [id number] Date of Birth: 1960-07-28 Referring Provider: Zara Council   Encounter Date: 04/12/2017  PT End of Session - 04/12/17 1220    Visit Number  3    Number of Visits  10    Date for PT Re-Evaluation  05/17/17    Authorization Type  G-code    Authorization Time Period  03/08/2017-05/17/2017     Authorization - Visit Number  3    Authorization - Number of Visits  10    PT Start Time  9211    PT Stop Time  1155    PT Time Calculation (min)  50 min    Activity Tolerance  Patient tolerated treatment well;Other (comment) demonstrated agitation when instructed on log-roll technique    Behavior During Therapy  [facility] for tasks assessed/performed       Past Medical History:  Diagnosis Date  . Asthma   . Deafness   . Depression   . Diabetes mellitus without complication ([place])   . GERD (gastroesophageal reflux disease)   . Heart abnormality    per mother pt has bad heart murmor- sees Dr Ubaldo Glassing   . Hyperlipidemia   . Hypertension     Past Surgical History:  Procedure Laterality Date  . ABDOMINAL HYSTERECTOMY  2000   per mother [facility] thought pt cancer and did hyster ,cervix and ovaries  . CHOLECYSTECTOMY    . COLONOSCOPY WITH PROPOFOL N/A 08/17/2016   Procedure: COLONOSCOPY WITH PROPOFOL;  Surgeon: Manya Silvas, MD;  Location: [facility] ENDOSCOPY;  Service: Endoscopy;  Laterality: N/A;  . ESOPHAGOGASTRODUODENOSCOPY (EGD) WITH PROPOFOL N/A 08/17/2016   Procedure: ESOPHAGOGASTRODUODENOSCOPY (EGD) WITH PROPOFOL;  Surgeon: Manya Silvas, MD;  Location: [facility] ENDOSCOPY;  Service: Endoscopy;  Laterality: N/A;    There were no vitals filed for this visit.      Pelvic Floor Physical Therapy Treatment Note  SCREENING  Changes in medications, allergies, or  medical history?: yes, MRI revealed 2 prior strokes.  Interpreter Needed?: CAP Interpreter Agency: [place] Name: Barbaraann Boys   Interpreter ID: [id number]  Patient Declined Interpreter  No Patient signed [facility] waiver No    SUBJECTIVE  Patient reports: She has not felt well over the last week, she had an MRI done and it showed that she has had 2 strokes and they think that if she has another one it will be bad.   Precautions:  Stroke risk, Hearing impaired  Pain update: No pain complaint  Patient Goals: Improve balance and posture   OBJECTIVE  Changes in: Posture/Observations:  L iliac crest elevated R facing pelvis Improved pelvic alignment in supine and standing at end of session.  Palpation: TTP through psoas and QL and piriformis on L. Decreased with TP release  Gait Analysis: soulders rotate L and pelvis rotates R with every step patient able to make small improvement with cue to stand tall and take larger steps.  INTERVENTIONS THIS SESSION: Manual: TP release to L QL, Psoas, and piriformis. Long axis L LE distraction with quick-pull and general MET for sacral malalignment for improved pelvic alignment and improved balance.  Therex: reviewed HEP, patient did not demonstrate recall and admitted not to having done them. Self Care: Patient educated on the importance of diet and activity on decreasing risk for stroke and reiterated the  importance of doing HEP to allow for better movement.  Total Treatment Time: 50 min.                       PT Education - 04/12/17 1219    Education provided  Yes    Education Details  Educated on the importance of activity and died on decreasing stroke risk, reviewe HEP.    Person(s) Educated  Patient    Methods  Explanation;Demonstration;Tactile cues;Verbal cues;Handout via translator    Comprehension  Verbalized understanding;Returned demonstration;Verbal cues required;Tactile cues required;Need  further instruction via translator       PT Short Term Goals - 04/12/17 1214      PT [place] #1   Title  Patient will consistently demonstrate appropriate coordination of PF contraction with breathing to facilitate further strengthening.    Time  3    Period  Weeks    Status  Unable to assess      PT SHORT TERM GOAL #2   Title  Patient will report cessation of beverage consumption 2 hours before going to sleep at lease 3 nights per week over prior week to demonstrate understanding and implementation of education about bladder health.    Time  5    Period  Weeks    Status  Achieved      PT SHORT TERM GOAL #3   Title  Patient will perform a standardized balance assessment for further POC development     Time  2    Period  Weeks    Status  On-going        PT Long Term Goals - 03/08/17 1940      PT LONG TERM GOAL #1   Title  Patient will report no episodes of incontinence over the past 2 weeks to demonstrate improved function    Time  10    Period  Weeks    Status  New    Target Date  05/17/17      PT LONG TERM GOAL #2   Title  Patient will improve ____ balance assessment by ______ points to demonstrate improved balance and decreased risk for falls.    Time  10    Period  Weeks    Status  New    Target Date  05/17/17      PT LONG TERM GOAL #3   Title  Patient will describe a reduction in alcohol consumption by %50 to demonstrate commitment to her own health and understanding of education on bladder irritants and incontinence.    Time  10    Period  Weeks    Status  New    Target Date  05/17/17            Plan - 04/12/17 1221    Clinical Impression Statement  Patient is not demonstating compliance with HEP so it was reviewed with patient and her roommate who helps her at home some. She demonstrated improved pelvic alignment at the end of today's session but her gait pattern may un-do this by the next visit in 2 weeks. Patient does not seem to have  sufficient motivation to make changes in her movement patterns and it will be important to sell her on the value at subsequent visits. She has made within-session improvement today and will continue to benefit from skilled PT to help her improve her movement to decrease physical disability and prevent future injury.    Clinical Presentation  Stable  Clinical Decision Making  Moderate    Clinical Impairments Affecting Rehab Potential  Long history of alcohol consumption and smoking, has had one last min. cancel and one no-show, describes self as "lazy"    PT Frequency  1x / week    PT Duration  Other (comment) 10 weeks    PT Treatment/Interventions  ADLs/Self Care Home Management;Biofeedback;Gait training;Functional mobility training;Therapeutic activities;Therapeutic exercise;Balance training;Patient/family education;Neuromuscular re-education;Manual techniques    PT Next Visit Plan  perform breathing in mod. quad. with TA and PF contraction, review kegels with breathing, sit-to-stand, supine to sit.    PT Home Exercise Plan  quick flicks, shoulder retraction against wall, side-bend stretch in standing, diaphragmatic breathing    Consulted and Agree with Plan of Care  Patient    Family Member Consulted  Long-time friend whom she lives with named [name]       Patient will benefit from skilled therapeutic intervention in order to improve the following deficits and impairments:  Abnormal gait, Decreased cognition, Improper body mechanics, Pain, Decreased coordination, Postural dysfunction, Decreased strength, Decreased balance, Difficulty walking, Other (comment)  Visit Diagnosis: Other lack of coordination  Muscle weakness (generalized)  Mixed incontinence  Balance problem     Problem List Patient Active Problem List   Diagnosis Date Noted  . Overactive bladder 08/27/2016  . Hyperlipidemia, unspecified 08/27/2016  . Drug abuse ([place]) 08/27/2016  . Aortic valve stenosis 08/03/2016  .  Asthma without status asthmaticus 07/19/2016  . Diabetes mellitus type 2, uncomplicated ([place]) 49/35/5217  . Elevated LFTs 05/21/2016  . Colon cancer screening 05/21/2016  . Alcoholism /alcohol abuse ([place]) 05/21/2016  . Lipid disorder 09/16/2011  . Chronic depression 09/12/2011  . Tobacco abuse 09/12/2011  . OAB (overactive bladder) 09/12/2011  . Crack cocaine use 09/12/2011  . Back pain 09/12/2011  . Asthma, moderate persistent 09/12/2011  . Deafness 05/01/2011  . Hypertension 05/01/2011  . GERD (gastroesophageal reflux disease) 05/01/2011   Willa Rough DPT, ATC Willa Rough 04/12/2017, 12:47 PM  [place] MAIN [facility] SERVICES [address] [place], [place], [address] Phone: [phone]   Fax:  [phone]  Name: Brianna Harris MRN: [id number] Date of Birth: 04/13/1961

## 2017-04-12 NOTE — Patient Instructions (Signed)
Re-printed HEP from last visit due to patient non-compliance and reviewed with Pts. Roommate [name]

## 2017-04-25 DIAGNOSIS — R404 Transient alteration of awareness: Secondary | ICD-10-CM | POA: Diagnosis not present

## 2017-04-25 DIAGNOSIS — I639 Cerebral infarction, unspecified: Secondary | ICD-10-CM | POA: Diagnosis not present

## 2017-04-25 DIAGNOSIS — R9082 White matter disease, unspecified: Secondary | ICD-10-CM | POA: Diagnosis not present

## 2017-04-25 DIAGNOSIS — I6381 Other cerebral infarction due to occlusion or stenosis of small artery: Secondary | ICD-10-CM | POA: Diagnosis not present

## 2017-04-29 ENCOUNTER — Other Ambulatory Visit: Payer: Self-pay | Admitting: Internal Medicine

## 2017-04-29 DIAGNOSIS — Z1231 Encounter for screening mammogram for malignant neoplasm of breast: Secondary | ICD-10-CM

## 2017-05-23 DIAGNOSIS — F99 Mental disorder, not otherwise specified: Secondary | ICD-10-CM | POA: Diagnosis not present

## 2017-05-23 DIAGNOSIS — F1020 Alcohol dependence, uncomplicated: Secondary | ICD-10-CM | POA: Diagnosis not present

## 2017-05-31 ENCOUNTER — Inpatient Hospital Stay: Payer: PPO | Attending: Oncology

## 2017-05-31 DIAGNOSIS — F17210 Nicotine dependence, cigarettes, uncomplicated: Secondary | ICD-10-CM | POA: Insufficient documentation

## 2017-05-31 DIAGNOSIS — J4520 Mild intermittent asthma, uncomplicated: Secondary | ICD-10-CM | POA: Diagnosis not present

## 2017-05-31 DIAGNOSIS — H9190 Unspecified hearing loss, unspecified ear: Secondary | ICD-10-CM | POA: Insufficient documentation

## 2017-05-31 DIAGNOSIS — E119 Type 2 diabetes mellitus without complications: Secondary | ICD-10-CM | POA: Insufficient documentation

## 2017-05-31 DIAGNOSIS — E785 Hyperlipidemia, unspecified: Secondary | ICD-10-CM | POA: Insufficient documentation

## 2017-05-31 DIAGNOSIS — D72829 Elevated white blood cell count, unspecified: Secondary | ICD-10-CM | POA: Diagnosis not present

## 2017-05-31 DIAGNOSIS — Z79899 Other long term (current) drug therapy: Secondary | ICD-10-CM | POA: Diagnosis not present

## 2017-05-31 DIAGNOSIS — Z7984 Long term (current) use of oral hypoglycemic drugs: Secondary | ICD-10-CM | POA: Insufficient documentation

## 2017-05-31 DIAGNOSIS — I10 Essential (primary) hypertension: Secondary | ICD-10-CM | POA: Diagnosis not present

## 2017-05-31 DIAGNOSIS — K219 Gastro-esophageal reflux disease without esophagitis: Secondary | ICD-10-CM | POA: Diagnosis not present

## 2017-05-31 DIAGNOSIS — J45909 Unspecified asthma, uncomplicated: Secondary | ICD-10-CM | POA: Diagnosis not present

## 2017-05-31 DIAGNOSIS — E789 Disorder of lipoprotein metabolism, unspecified: Secondary | ICD-10-CM | POA: Diagnosis not present

## 2017-05-31 DIAGNOSIS — F329 Major depressive disorder, single episode, unspecified: Secondary | ICD-10-CM | POA: Diagnosis not present

## 2017-05-31 DIAGNOSIS — Z720 Tobacco use: Secondary | ICD-10-CM | POA: Diagnosis not present

## 2017-05-31 LAB — CBC WITH DIFFERENTIAL/PLATELET
Basophils Absolute: 0.1 10*3/uL (ref 0–0.1)
Basophils Relative: 1 %
Eosinophils Absolute: 0.6 10*3/uL (ref 0–0.7)
Eosinophils Relative: 5 %
HCT: 41.8 % (ref 35.0–47.0)
Hemoglobin: 13.9 g/dL (ref 12.0–16.0)
Lymphocytes Relative: 28 %
Lymphs Abs: 3.5 10*3/uL (ref 1.0–3.6)
MCH: 29.3 pg (ref 26.0–34.0)
MCHC: 33.3 g/dL (ref 32.0–36.0)
MCV: 88 fL (ref 80.0–100.0)
Monocytes Absolute: 0.7 10*3/uL (ref 0.2–0.9)
Monocytes Relative: 6 %
Neutro Abs: 7.9 10*3/uL — ABNORMAL HIGH (ref 1.4–6.5)
Neutrophils Relative %: 62 %
Platelets: 400 10*3/uL (ref 150–440)
RBC: 4.75 MIL/uL (ref 3.80–5.20)
RDW: 13.1 % (ref 11.5–14.5)
WBC: 12.8 10*3/uL — ABNORMAL HIGH (ref 3.6–11.0)

## 2017-06-03 DIAGNOSIS — Z8709 Personal history of other diseases of the respiratory system: Secondary | ICD-10-CM | POA: Diagnosis not present

## 2017-06-03 DIAGNOSIS — Z720 Tobacco use: Secondary | ICD-10-CM | POA: Diagnosis not present

## 2017-06-03 DIAGNOSIS — E119 Type 2 diabetes mellitus without complications: Secondary | ICD-10-CM | POA: Diagnosis not present

## 2017-06-03 DIAGNOSIS — J069 Acute upper respiratory infection, unspecified: Secondary | ICD-10-CM | POA: Diagnosis not present

## 2017-06-03 DIAGNOSIS — I350 Nonrheumatic aortic (valve) stenosis: Secondary | ICD-10-CM | POA: Diagnosis not present

## 2017-06-07 ENCOUNTER — Ambulatory Visit
Admission: RE | Admit: 2017-06-07 | Discharge: 2017-06-07 | Disposition: A | Discharge status: Home / Self Care | Payer: PPO | Source: Ambulatory Visit | Attending: Internal Medicine | Admitting: Internal Medicine

## 2017-06-07 DIAGNOSIS — Z1231 Encounter for screening mammogram for malignant neoplasm of breast: Secondary | ICD-10-CM

## 2017-06-24 DIAGNOSIS — H9193 Unspecified hearing loss, bilateral: Secondary | ICD-10-CM | POA: Diagnosis not present

## 2017-06-24 DIAGNOSIS — I359 Nonrheumatic aortic valve disorder, unspecified: Secondary | ICD-10-CM | POA: Diagnosis not present

## 2017-06-24 DIAGNOSIS — I350 Nonrheumatic aortic (valve) stenosis: Secondary | ICD-10-CM | POA: Diagnosis not present

## 2017-06-24 DIAGNOSIS — E7801 Familial hypercholesterolemia: Secondary | ICD-10-CM | POA: Diagnosis not present

## 2017-06-24 DIAGNOSIS — I10 Essential (primary) hypertension: Secondary | ICD-10-CM | POA: Diagnosis not present

## 2017-06-28 DIAGNOSIS — F99 Mental disorder, not otherwise specified: Secondary | ICD-10-CM | POA: Diagnosis not present

## 2017-06-28 DIAGNOSIS — R42 Dizziness and giddiness: Secondary | ICD-10-CM | POA: Diagnosis not present

## 2017-06-28 DIAGNOSIS — I639 Cerebral infarction, unspecified: Secondary | ICD-10-CM | POA: Diagnosis not present

## 2017-07-05 DIAGNOSIS — I359 Nonrheumatic aortic valve disorder, unspecified: Secondary | ICD-10-CM | POA: Diagnosis not present

## 2017-07-12 DIAGNOSIS — E7801 Familial hypercholesterolemia: Secondary | ICD-10-CM | POA: Diagnosis not present

## 2017-07-12 DIAGNOSIS — R0602 Shortness of breath: Secondary | ICD-10-CM | POA: Diagnosis not present

## 2017-07-12 DIAGNOSIS — I10 Essential (primary) hypertension: Secondary | ICD-10-CM | POA: Diagnosis not present

## 2017-07-12 DIAGNOSIS — I350 Nonrheumatic aortic (valve) stenosis: Secondary | ICD-10-CM | POA: Diagnosis not present

## 2017-07-29 DIAGNOSIS — E119 Type 2 diabetes mellitus without complications: Secondary | ICD-10-CM | POA: Diagnosis not present

## 2017-08-12 DIAGNOSIS — E119 Type 2 diabetes mellitus without complications: Secondary | ICD-10-CM | POA: Diagnosis not present

## 2017-08-12 DIAGNOSIS — Z720 Tobacco use: Secondary | ICD-10-CM | POA: Diagnosis not present

## 2017-08-12 DIAGNOSIS — J069 Acute upper respiratory infection, unspecified: Secondary | ICD-10-CM | POA: Diagnosis not present

## 2017-08-23 DIAGNOSIS — E119 Type 2 diabetes mellitus without complications: Secondary | ICD-10-CM | POA: Diagnosis not present

## 2017-08-28 DIAGNOSIS — E119 Type 2 diabetes mellitus without complications: Secondary | ICD-10-CM | POA: Diagnosis not present

## 2017-09-09 DIAGNOSIS — I10 Essential (primary) hypertension: Secondary | ICD-10-CM | POA: Diagnosis not present

## 2017-09-09 DIAGNOSIS — H9193 Unspecified hearing loss, bilateral: Secondary | ICD-10-CM | POA: Diagnosis not present

## 2017-09-09 DIAGNOSIS — E119 Type 2 diabetes mellitus without complications: Secondary | ICD-10-CM | POA: Diagnosis not present

## 2017-09-09 DIAGNOSIS — Z720 Tobacco use: Secondary | ICD-10-CM | POA: Diagnosis not present

## 2017-09-09 DIAGNOSIS — J4520 Mild intermittent asthma, uncomplicated: Secondary | ICD-10-CM | POA: Diagnosis not present

## 2017-09-09 DIAGNOSIS — Z0000 Encounter for general adult medical examination without abnormal findings: Secondary | ICD-10-CM | POA: Diagnosis not present

## 2017-09-09 DIAGNOSIS — F329 Major depressive disorder, single episode, unspecified: Secondary | ICD-10-CM | POA: Diagnosis not present

## 2017-09-11 DIAGNOSIS — R2689 Other abnormalities of gait and mobility: Secondary | ICD-10-CM | POA: Diagnosis not present

## 2017-09-11 DIAGNOSIS — R404 Transient alteration of awareness: Secondary | ICD-10-CM | POA: Diagnosis not present

## 2017-09-11 DIAGNOSIS — Z8673 Personal history of transient ischemic attack (TIA), and cerebral infarction without residual deficits: Secondary | ICD-10-CM | POA: Diagnosis not present

## 2017-10-10 DIAGNOSIS — J069 Acute upper respiratory infection, unspecified: Secondary | ICD-10-CM | POA: Diagnosis not present

## 2017-10-10 DIAGNOSIS — N390 Urinary tract infection, site not specified: Secondary | ICD-10-CM | POA: Diagnosis not present

## 2017-10-10 DIAGNOSIS — R350 Frequency of micturition: Secondary | ICD-10-CM | POA: Diagnosis not present

## 2017-10-13 DIAGNOSIS — J069 Acute upper respiratory infection, unspecified: Secondary | ICD-10-CM | POA: Diagnosis not present

## 2017-10-13 DIAGNOSIS — R05 Cough: Secondary | ICD-10-CM | POA: Diagnosis not present

## 2017-10-13 DIAGNOSIS — J019 Acute sinusitis, unspecified: Secondary | ICD-10-CM | POA: Diagnosis not present

## 2017-10-18 DIAGNOSIS — R404 Transient alteration of awareness: Secondary | ICD-10-CM | POA: Diagnosis not present

## 2017-10-18 DIAGNOSIS — Z5181 Encounter for therapeutic drug level monitoring: Secondary | ICD-10-CM | POA: Diagnosis not present

## 2017-11-28 ENCOUNTER — Other Ambulatory Visit: Payer: Self-pay

## 2017-11-28 DIAGNOSIS — Z1211 Encounter for screening for malignant neoplasm of colon: Secondary | ICD-10-CM

## 2017-11-29 ENCOUNTER — Encounter: Payer: Self-pay | Admitting: Oncology

## 2017-11-29 ENCOUNTER — Other Ambulatory Visit: Payer: Self-pay

## 2017-11-29 ENCOUNTER — Inpatient Hospital Stay (HOSPITAL_BASED_OUTPATIENT_CLINIC_OR_DEPARTMENT_OTHER): Payer: PPO | Admitting: Oncology

## 2017-11-29 ENCOUNTER — Inpatient Hospital Stay: Payer: PPO | Attending: Oncology

## 2017-11-29 VITALS — BP 114/77 | HR 84 | Temp 97.7°F | Resp 18 | Ht 62.0 in | Wt 129.0 lb

## 2017-11-29 DIAGNOSIS — D72829 Elevated white blood cell count, unspecified: Secondary | ICD-10-CM | POA: Insufficient documentation

## 2017-11-29 DIAGNOSIS — Z1211 Encounter for screening for malignant neoplasm of colon: Secondary | ICD-10-CM

## 2017-11-29 DIAGNOSIS — H9190 Unspecified hearing loss, unspecified ear: Secondary | ICD-10-CM | POA: Insufficient documentation

## 2017-11-29 DIAGNOSIS — F17210 Nicotine dependence, cigarettes, uncomplicated: Secondary | ICD-10-CM | POA: Insufficient documentation

## 2017-11-29 DIAGNOSIS — D729 Disorder of white blood cells, unspecified: Secondary | ICD-10-CM

## 2017-11-29 LAB — CBC WITH DIFFERENTIAL/PLATELET
Basophils Absolute: 0.1 10*3/uL (ref 0–0.1)
Basophils Relative: 1 %
Eosinophils Absolute: 0.6 10*3/uL (ref 0–0.7)
Eosinophils Relative: 5 %
HCT: 36.8 % (ref 35.0–47.0)
Hemoglobin: 12.4 g/dL (ref 12.0–16.0)
Lymphocytes Relative: 28 %
Lymphs Abs: 3.4 10*3/uL (ref 1.0–3.6)
MCH: 29.3 pg (ref 26.0–34.0)
MCHC: 33.8 g/dL (ref 32.0–36.0)
MCV: 86.8 fL (ref 80.0–100.0)
Monocytes Absolute: 0.7 10*3/uL (ref 0.2–0.9)
Monocytes Relative: 6 %
Neutro Abs: 7.3 10*3/uL — ABNORMAL HIGH (ref 1.4–6.5)
Neutrophils Relative %: 60 %
Platelets: 398 10*3/uL (ref 150–440)
RBC: 4.24 MIL/uL (ref 3.80–5.20)
RDW: 13.4 % (ref 11.5–14.5)
WBC: 12 10*3/uL — ABNORMAL HIGH (ref 3.6–11.0)

## 2017-11-29 NOTE — Progress Notes (Signed)
No new changes noted today 

## 2017-12-02 NOTE — Progress Notes (Signed)
Hematology/Oncology Consult note [facility]  [phone] [phone]  Patient Care Team: System, Provider Not In as PCP - General   Name of the patient: Brianna Harris  [id number]  1960/11/03   Date of visit: 12/02/17  Diagnosis- leucocytosis likely reactive due to smoking   Chief complaint/ Reason for visit- routine f/u of leucocytosis  Heme/Onc history: patient is a 57 year old female who was been sent to [place] by her PCP for evaluation of leukocytosis/lymphocytosis. Patient is deaf and requires sign language for interpreter and is here with her interpreter and family member today. . On review of her outside blood work patient has had chronic leukocytosis dating back at least to August 2015 and a white count ranges between 11-13. Most recent CBC from 06/20/2016 showed white count of 12.9, H&H of 14.8/43.9 and a platelet count of 386. Differential on the CBC mainly showed absolute lymphocytosis with lymphocyte count between 3600-4200 and the past. She has also been noted to have mild eosinophilia between 600-700 and the past. Patient does have uncontrolled diabetes but otherwise her CMP is within normal limits. Patient under family member reports unintentional weight loss of about 15-20 pounds over the last 6 months. Patient smokes about 1-1/2 packs per day and has been doing so for the last 15-20 years.   Blood work from 07/19/16 showed cbc with wbc of 14.5 with neutrophilia and lymphocytosis. Bcr abl testing was negative for cml. Peripheral flow cytometry showed no significant immunophenotypic abnormality. Absolute increases in both the anti-lymphocyte subsets are detected. No monoclonal B-cell population detected. Results support a reactive from the neoplastic process  Interval history-history obtained with the help of sign language interpreter.  Patient reports doing well and denies any complaints today.  She continues to smoke and has not shown  any interest in quitting.  She is also currently being evaluated by cardiology for possible valve surgery  ECOG PS- 0 Pain scale- 0 Opioid associated constipation- no  Review of systems- Review of Systems  Constitutional: Negative for chills, fever, malaise/fatigue and weight loss.  HENT: Negative for congestion, ear discharge and nosebleeds.   Eyes: Negative for blurred vision.  Respiratory: Negative for cough, hemoptysis, sputum production, shortness of breath and wheezing.   Cardiovascular: Negative for chest pain, palpitations, orthopnea and claudication.  Gastrointestinal: Negative for abdominal pain, blood in stool, constipation, diarrhea, heartburn, melena, nausea and vomiting.  Genitourinary: Negative for dysuria, flank pain, frequency, hematuria and urgency.  Musculoskeletal: Negative for back pain, joint pain and myalgias.  Skin: Negative for rash.  Neurological: Negative for dizziness, tingling, focal weakness, seizures, weakness and headaches.  Endo/Heme/Allergies: Does not bruise/bleed easily.  Psychiatric/Behavioral: Negative for depression and suicidal ideas. The patient does not have insomnia.       No Known Allergies   Past Medical History:  Diagnosis Date  . Asthma   . Deafness   . Depression   . Diabetes mellitus without complication ([place])   . GERD (gastroesophageal reflux disease)   . Heart abnormality    per mother pt has bad heart murmor- sees Dr Ubaldo Glassing   . Hyperlipidemia   . Hypertension      Past Surgical History:  Procedure Laterality Date  . ABDOMINAL HYSTERECTOMY  2000   per mother [facility] thought pt cancer and did hyster ,cervix and ovaries  . CHOLECYSTECTOMY    . COLONOSCOPY WITH PROPOFOL N/A 08/17/2016   Procedure: COLONOSCOPY WITH PROPOFOL;  Surgeon: Manya Silvas, MD;  Location[facility] END[facility]COPY;  Service: Endoscopy;  Laterality: N/A;  . ESOPHAGOGASTRODUODENOSCOPY (EGD) WITH PROPOFOL N/A 08/17/2016   Procedure: ESOPHAGOGASTRODUODENOSCOPY (EGD)  WITH PROPOFOL;  Surgeon: Manya Silvas, MD;  Location: [facility] ENDOSCOPY;  Service: Endoscopy;  Laterality: N/A;    Social History   Socioeconomic History  . Marital status: Single    Spouse name: Not on file  . Number of children: Not on file  . Years of education: Not on file  . Highest education level: Not on file  Occupational History  . Not on file  Social Needs  . Financial resource strain: Not on file  . Food insecurity:    Worry: Not on file    Inability: Not on file  . Transportation needs:    Medical: Not on file    Non-medical: Not on file  Tobacco Use  . Smoking status: Current Every Day Smoker    Packs/day: 1.50    Years: 15.00    Pack years: 22.50    Types: Cigarettes  . Smokeless tobacco: Never Used  Substance and Sexual Activity  . Alcohol use: Yes    Alcohol/week: 5.0 standard drinks    Types: 5 Glasses of wine per week  . Drug use: No    Types: Marijuana    Comment: smoked 3 yr ago,for 2 y. Hx of cocaine use   . Sexual activity: Never  Lifestyle  . Physical activity:    Days per week: Not on file    Minutes per session: Not on file  . Stress: Not on file  Relationships  . Social connections:    Talks on phone: Not on file    Gets together: Not on file    Attends religious service: Not on file    Active member of club or organization: Not on file    Attends meetings of clubs or organizations: Not on file    Relationship status: Not on file  . Intimate partner violence:    Fear of current or ex partner: Not on file    Emotionally abused: Not on file    Physically abused: Not on file    Forced sexual activity: Not on file  Other Topics Concern  . Not on file  Social History Narrative  . Not on file    Family History  Problem Relation Age of Onset  . Diabetes Maternal Uncle   . Heart attack Father   . Kidney cancer Paternal Uncle   . Breast cancer Neg Hx   . Bladder Cancer Neg Hx      Current Outpatient Medications:  .  amLODipine  (NORVASC) 5 MG tablet, TAKE 1 TABLET BY MOUTH ONCE DAILY, Disp: , Rfl:  .  ASPIRIN 81 PO, 1 tab by mouth as needed, Disp: , Rfl:  .  atorvastatin (LIPITOR) 40 MG tablet, TAKE ONE TABLET BY MOUTH ONCE DAILY, Disp: , Rfl:  .  fluticasone furoate-vilanterol (BREO ELLIPTA) 100-25 MCG/INH AEPB, INHALE ONE PUFF BY MOUTH ONCE DAILY, Disp: , Rfl:  .  hydrochlorothiazide (HYDRODIURIL) 25 MG tablet, Take 25 mg by mouth daily. , Disp: , Rfl:  .  metFORMIN (GLUCOPHAGE-XR) 500 MG 24 hr tablet, Take 500 mg by mouth 3 (three) times daily. Take 4 tabs (2,000 mg total) by mouth daily with dinner , Disp: , Rfl:  .  mirabegron ER (MYRBETRIQ) 25 MG TB24 tablet, Take 1 tablet (25 mg total) by mouth daily., Disp: 90 tablet, Rfl: 3 .  omeprazole (PRILOSEC) 20 MG capsule, Take 20 mg by mouth daily. ,  Disp: , Rfl:  .  ONE TOUCH ULTRA TEST test strip, , Disp: , Rfl:  .  albuterol (PROVENTIL HFA;VENTOLIN HFA) 108 (90 Base) MCG/ACT inhaler, Inhale into the lungs., Disp: , Rfl:  .  amLODipine (NORVASC) 5 MG tablet, Take 5 mg by mouth daily. , Disp: , Rfl:  .  chlorpheniramine-HYDROcodone (TUSSIONEX) 10-8 MG/5ML SUER, Take by mouth., Disp: , Rfl:  .  conjugated estrogens (PREMARIN) vaginal cream, Place 1 Applicatorful vaginally daily. Apply 0.5mg  (pea-sized amount)  just inside the vaginal introitus with a finger-tip every night for two weeks and then Monday, Wednesday and Friday nights. (Patient not taking: Reported on 04/09/2017), Disp: 30 g, Rfl: 12 .  lisinopril (PRINIVIL,ZESTRIL) 20 MG tablet, Take 20 mg by mouth daily. , Disp: , Rfl:   Physical exam:  Vitals:   11/29/17 1348  BP: 114/77  Pulse: 84  Resp: 18  Temp: 97.7 F (36.5 C)  TempSrc: Tympanic  SpO2: 95%  Weight: 129 lb (58.5 kg)  Height: 5\' 2"  (1.575 m)   Physical Exam  Constitutional: She is oriented to person, place, and time. She appears well-developed and well-nourished.  HENT:  Head: Normocephalic and atraumatic.  Eyes: Pupils are equal, round,  and reactive to light. EOM are normal.  Neck: Normal range of motion.  Cardiovascular: Normal rate and regular rhythm.  Systolic murmur positive  Pulmonary/Chest: Effort normal and breath sounds normal.  Abdominal: Soft. Bowel sounds are normal.  Neurological: She is alert and oriented to person, place, and time.  Skin: Skin is warm and dry.     No flowsheet data found. CBC Latest Ref Rng & Units 11/29/2017  WBC 3.6 - 11.0 K/uL 12.0(H)  Hemoglobin 12.0 - 16.0 g/dL 12.4  Hematocrit 35.0 - 47.0 % 36.8  Platelets 150 - 440 K/uL 398    No images are attached to the encounter.  No results found.   Assessment and plan- Patient is a 57 y.o. female referred for leukocytosis mainly neutrophilia likely secondary to smoking  Patient has had chronic neutrophilia for the past couple of years and her white count fluctuates between 10-15.  Differential mainly shows neutrophilia.  Flow cytometry did not reveal any evidence of lymphoma or leukemia.  Is likely secondary to her smoking and can be monitored by her PCP every 6 months to 1 year.  She does not require any hematology follow-up at this time.  I have again encouraged her to quit smoking   Visit Diagnosis 1. Neutrophilia      Dr. Randa Evens, MD, MPH [facility] at [facility] [phone] 12/02/2017 12:17 PM

## 2017-12-06 DIAGNOSIS — E119 Type 2 diabetes mellitus without complications: Secondary | ICD-10-CM | POA: Diagnosis not present

## 2017-12-09 DIAGNOSIS — E7801 Familial hypercholesterolemia: Secondary | ICD-10-CM | POA: Diagnosis not present

## 2017-12-09 DIAGNOSIS — H9193 Unspecified hearing loss, bilateral: Secondary | ICD-10-CM | POA: Diagnosis not present

## 2017-12-09 DIAGNOSIS — I10 Essential (primary) hypertension: Secondary | ICD-10-CM | POA: Diagnosis not present

## 2017-12-09 DIAGNOSIS — K219 Gastro-esophageal reflux disease without esophagitis: Secondary | ICD-10-CM | POA: Diagnosis not present

## 2017-12-09 DIAGNOSIS — E119 Type 2 diabetes mellitus without complications: Secondary | ICD-10-CM | POA: Diagnosis not present

## 2017-12-09 DIAGNOSIS — B372 Candidiasis of skin and nail: Secondary | ICD-10-CM | POA: Diagnosis not present

## 2017-12-09 DIAGNOSIS — Z720 Tobacco use: Secondary | ICD-10-CM | POA: Diagnosis not present

## 2017-12-09 DIAGNOSIS — J4520 Mild intermittent asthma, uncomplicated: Secondary | ICD-10-CM | POA: Diagnosis not present

## 2018-01-06 DIAGNOSIS — R0602 Shortness of breath: Secondary | ICD-10-CM | POA: Diagnosis not present

## 2018-01-13 DIAGNOSIS — I350 Nonrheumatic aortic (valve) stenosis: Secondary | ICD-10-CM | POA: Diagnosis not present

## 2018-01-13 DIAGNOSIS — E119 Type 2 diabetes mellitus without complications: Secondary | ICD-10-CM | POA: Diagnosis not present

## 2018-01-13 DIAGNOSIS — I10 Essential (primary) hypertension: Secondary | ICD-10-CM | POA: Diagnosis not present

## 2018-01-13 DIAGNOSIS — Z720 Tobacco use: Secondary | ICD-10-CM | POA: Diagnosis not present

## 2018-01-20 DIAGNOSIS — Z79899 Other long term (current) drug therapy: Secondary | ICD-10-CM | POA: Insufficient documentation

## 2018-01-20 DIAGNOSIS — F1411 Cocaine abuse, in remission: Secondary | ICD-10-CM | POA: Insufficient documentation

## 2018-01-21 HISTORY — PX: PACEMAKER IMPLANT: EP1218

## 2018-01-28 DIAGNOSIS — Z720 Tobacco use: Secondary | ICD-10-CM | POA: Diagnosis not present

## 2018-01-28 DIAGNOSIS — I350 Nonrheumatic aortic (valve) stenosis: Secondary | ICD-10-CM | POA: Diagnosis not present

## 2018-01-28 DIAGNOSIS — Z7982 Long term (current) use of aspirin: Secondary | ICD-10-CM | POA: Diagnosis not present

## 2018-01-28 DIAGNOSIS — F1010 Alcohol abuse, uncomplicated: Secondary | ICD-10-CM | POA: Diagnosis not present

## 2018-01-28 DIAGNOSIS — F17210 Nicotine dependence, cigarettes, uncomplicated: Secondary | ICD-10-CM | POA: Diagnosis not present

## 2018-01-28 DIAGNOSIS — Z79899 Other long term (current) drug therapy: Secondary | ICD-10-CM | POA: Diagnosis not present

## 2018-01-28 DIAGNOSIS — F1411 Cocaine abuse, in remission: Secondary | ICD-10-CM | POA: Diagnosis not present

## 2018-01-31 DIAGNOSIS — I350 Nonrheumatic aortic (valve) stenosis: Secondary | ICD-10-CM | POA: Diagnosis not present

## 2018-01-31 DIAGNOSIS — E042 Nontoxic multinodular goiter: Secondary | ICD-10-CM | POA: Diagnosis not present

## 2018-01-31 DIAGNOSIS — Z952 Presence of prosthetic heart valve: Secondary | ICD-10-CM | POA: Diagnosis not present

## 2018-01-31 DIAGNOSIS — Q231 Congenital insufficiency of aortic valve: Secondary | ICD-10-CM | POA: Diagnosis not present

## 2018-02-04 DIAGNOSIS — Q244 Congenital subaortic stenosis: Secondary | ICD-10-CM | POA: Diagnosis not present

## 2018-02-04 DIAGNOSIS — Z720 Tobacco use: Secondary | ICD-10-CM | POA: Diagnosis not present

## 2018-02-04 DIAGNOSIS — I498 Other specified cardiac arrhythmias: Secondary | ICD-10-CM | POA: Diagnosis not present

## 2018-02-04 DIAGNOSIS — Z01810 Encounter for preprocedural cardiovascular examination: Secondary | ICD-10-CM | POA: Diagnosis not present

## 2018-02-04 DIAGNOSIS — I350 Nonrheumatic aortic (valve) stenosis: Secondary | ICD-10-CM | POA: Diagnosis not present

## 2018-02-04 DIAGNOSIS — I10 Essential (primary) hypertension: Secondary | ICD-10-CM | POA: Diagnosis not present

## 2018-02-04 DIAGNOSIS — Z8673 Personal history of transient ischemic attack (TIA), and cerebral infarction without residual deficits: Secondary | ICD-10-CM | POA: Diagnosis not present

## 2018-02-04 DIAGNOSIS — R9431 Abnormal electrocardiogram [ECG] [EKG]: Secondary | ICD-10-CM | POA: Diagnosis not present

## 2018-02-13 DIAGNOSIS — I9789 Other postprocedural complications and disorders of the circulatory system, not elsewhere classified: Secondary | ICD-10-CM | POA: Diagnosis not present

## 2018-02-13 DIAGNOSIS — J811 Chronic pulmonary edema: Secondary | ICD-10-CM | POA: Diagnosis not present

## 2018-02-13 DIAGNOSIS — Z952 Presence of prosthetic heart valve: Secondary | ICD-10-CM | POA: Diagnosis not present

## 2018-02-13 DIAGNOSIS — I10 Essential (primary) hypertension: Secondary | ICD-10-CM | POA: Diagnosis not present

## 2018-02-13 DIAGNOSIS — R42 Dizziness and giddiness: Secondary | ICD-10-CM | POA: Diagnosis not present

## 2018-02-13 DIAGNOSIS — I350 Nonrheumatic aortic (valve) stenosis: Secondary | ICD-10-CM | POA: Diagnosis not present

## 2018-02-13 DIAGNOSIS — G8918 Other acute postprocedural pain: Secondary | ICD-10-CM | POA: Diagnosis not present

## 2018-02-13 DIAGNOSIS — D62 Acute posthemorrhagic anemia: Secondary | ICD-10-CM | POA: Diagnosis not present

## 2018-02-13 DIAGNOSIS — K219 Gastro-esophageal reflux disease without esophagitis: Secondary | ICD-10-CM | POA: Diagnosis not present

## 2018-02-13 DIAGNOSIS — J90 Pleural effusion, not elsewhere classified: Secondary | ICD-10-CM | POA: Diagnosis not present

## 2018-02-13 DIAGNOSIS — R918 Other nonspecific abnormal finding of lung field: Secondary | ICD-10-CM | POA: Diagnosis not present

## 2018-02-13 DIAGNOSIS — Z79899 Other long term (current) drug therapy: Secondary | ICD-10-CM | POA: Diagnosis not present

## 2018-02-13 DIAGNOSIS — Z9049 Acquired absence of other specified parts of digestive tract: Secondary | ICD-10-CM | POA: Diagnosis not present

## 2018-02-13 DIAGNOSIS — J939 Pneumothorax, unspecified: Secondary | ICD-10-CM | POA: Diagnosis not present

## 2018-02-13 DIAGNOSIS — E119 Type 2 diabetes mellitus without complications: Secondary | ICD-10-CM | POA: Diagnosis not present

## 2018-02-13 DIAGNOSIS — J9811 Atelectasis: Secondary | ICD-10-CM | POA: Diagnosis not present

## 2018-02-13 DIAGNOSIS — Z7984 Long term (current) use of oral hypoglycemic drugs: Secondary | ICD-10-CM | POA: Diagnosis not present

## 2018-02-13 DIAGNOSIS — F1411 Cocaine abuse, in remission: Secondary | ICD-10-CM | POA: Diagnosis not present

## 2018-02-13 DIAGNOSIS — Z9889 Other specified postprocedural states: Secondary | ICD-10-CM | POA: Diagnosis not present

## 2018-02-13 DIAGNOSIS — R768 Other specified abnormal immunological findings in serum: Secondary | ICD-10-CM | POA: Diagnosis not present

## 2018-02-13 DIAGNOSIS — F17210 Nicotine dependence, cigarettes, uncomplicated: Secondary | ICD-10-CM | POA: Diagnosis not present

## 2018-02-13 DIAGNOSIS — Z90710 Acquired absence of both cervix and uterus: Secondary | ICD-10-CM | POA: Diagnosis not present

## 2018-02-13 DIAGNOSIS — Z8673 Personal history of transient ischemic attack (TIA), and cerebral infarction without residual deficits: Secondary | ICD-10-CM | POA: Diagnosis not present

## 2018-02-13 DIAGNOSIS — I358 Other nonrheumatic aortic valve disorders: Secondary | ICD-10-CM | POA: Diagnosis not present

## 2018-02-13 DIAGNOSIS — E118 Type 2 diabetes mellitus with unspecified complications: Secondary | ICD-10-CM | POA: Diagnosis not present

## 2018-02-13 DIAGNOSIS — I517 Cardiomegaly: Secondary | ICD-10-CM | POA: Diagnosis not present

## 2018-02-13 DIAGNOSIS — Z48812 Encounter for surgical aftercare following surgery on the circulatory system: Secondary | ICD-10-CM | POA: Diagnosis not present

## 2018-02-13 DIAGNOSIS — F329 Major depressive disorder, single episode, unspecified: Secondary | ICD-10-CM | POA: Diagnosis not present

## 2018-02-13 DIAGNOSIS — N3281 Overactive bladder: Secondary | ICD-10-CM | POA: Diagnosis not present

## 2018-02-13 DIAGNOSIS — Z950 Presence of cardiac pacemaker: Secondary | ICD-10-CM | POA: Diagnosis not present

## 2018-02-13 DIAGNOSIS — Z7901 Long term (current) use of anticoagulants: Secondary | ICD-10-CM | POA: Diagnosis not present

## 2018-02-13 DIAGNOSIS — H905 Unspecified sensorineural hearing loss: Secondary | ICD-10-CM | POA: Diagnosis not present

## 2018-02-13 DIAGNOSIS — I442 Atrioventricular block, complete: Secondary | ICD-10-CM | POA: Diagnosis not present

## 2018-02-13 DIAGNOSIS — E785 Hyperlipidemia, unspecified: Secondary | ICD-10-CM | POA: Diagnosis not present

## 2018-02-13 DIAGNOSIS — J4540 Moderate persistent asthma, uncomplicated: Secondary | ICD-10-CM | POA: Diagnosis not present

## 2018-02-13 DIAGNOSIS — E876 Hypokalemia: Secondary | ICD-10-CM | POA: Diagnosis not present

## 2018-02-13 DIAGNOSIS — I97190 Other postprocedural cardiac functional disturbances following cardiac surgery: Secondary | ICD-10-CM | POA: Diagnosis not present

## 2018-02-13 DIAGNOSIS — Q231 Congenital insufficiency of aortic valve: Secondary | ICD-10-CM | POA: Diagnosis not present

## 2018-02-13 DIAGNOSIS — K5790 Diverticulosis of intestine, part unspecified, without perforation or abscess without bleeding: Secondary | ICD-10-CM | POA: Diagnosis not present

## 2018-02-14 DIAGNOSIS — Z952 Presence of prosthetic heart valve: Secondary | ICD-10-CM | POA: Insufficient documentation

## 2018-02-14 DIAGNOSIS — R768 Other specified abnormal immunological findings in serum: Secondary | ICD-10-CM | POA: Insufficient documentation

## 2018-02-16 DIAGNOSIS — R34 Anuria and oliguria: Secondary | ICD-10-CM | POA: Insufficient documentation

## 2018-02-16 DIAGNOSIS — Z7901 Long term (current) use of anticoagulants: Secondary | ICD-10-CM | POA: Insufficient documentation

## 2018-02-17 DIAGNOSIS — Z789 Other specified health status: Secondary | ICD-10-CM | POA: Insufficient documentation

## 2018-02-17 DIAGNOSIS — Z8669 Personal history of other diseases of the nervous system and sense organs: Secondary | ICD-10-CM | POA: Insufficient documentation

## 2018-02-17 DIAGNOSIS — R001 Bradycardia, unspecified: Secondary | ICD-10-CM | POA: Insufficient documentation

## 2018-02-24 DIAGNOSIS — E119 Type 2 diabetes mellitus without complications: Secondary | ICD-10-CM | POA: Diagnosis not present

## 2018-02-24 DIAGNOSIS — Z48812 Encounter for surgical aftercare following surgery on the circulatory system: Secondary | ICD-10-CM | POA: Diagnosis not present

## 2018-02-24 DIAGNOSIS — Z7901 Long term (current) use of anticoagulants: Secondary | ICD-10-CM | POA: Diagnosis not present

## 2018-02-24 DIAGNOSIS — I350 Nonrheumatic aortic (valve) stenosis: Secondary | ICD-10-CM | POA: Diagnosis not present

## 2018-02-24 DIAGNOSIS — Z952 Presence of prosthetic heart valve: Secondary | ICD-10-CM | POA: Diagnosis not present

## 2018-02-24 DIAGNOSIS — Z7984 Long term (current) use of oral hypoglycemic drugs: Secondary | ICD-10-CM | POA: Diagnosis not present

## 2018-02-24 DIAGNOSIS — I10 Essential (primary) hypertension: Secondary | ICD-10-CM | POA: Diagnosis not present

## 2018-02-24 DIAGNOSIS — J4540 Moderate persistent asthma, uncomplicated: Secondary | ICD-10-CM | POA: Diagnosis not present

## 2018-02-24 DIAGNOSIS — R791 Abnormal coagulation profile: Secondary | ICD-10-CM | POA: Diagnosis not present

## 2018-02-24 DIAGNOSIS — H9193 Unspecified hearing loss, bilateral: Secondary | ICD-10-CM | POA: Diagnosis not present

## 2018-02-24 DIAGNOSIS — F17210 Nicotine dependence, cigarettes, uncomplicated: Secondary | ICD-10-CM | POA: Diagnosis not present

## 2018-02-28 ENCOUNTER — Other Ambulatory Visit: Payer: Self-pay | Admitting: *Deleted

## 2018-02-28 NOTE — Patient Outreach (Signed)
[place] [facility]) Care Management  02/28/2018  Brianna Harris 12-30-1960 [id number]   Transition of Care Referral   Referral Date: 02/26/18 Referral Source: [facility] Urgent Outreach for [name] Date of Admission:  Diagnosis: s/p AVR gait difficulty decreased strength Date of Discharge:on 02/22/18. Facility: [name]: HTA  Outreach attempt # 1 No answer. [name] left HIPAA compliant voicemail message along with CM's contact info.   Plan: [facility] RN CM sent an unsuccessful outreach letter and scheduled this patient for another call attempt within 4 business days  Kimberly L. Lavina Hamman, RN, BSN, [name] Care Coordinator Direct Number [phone] Mobile number [phone]  Main [facility] number [phone] Fax number [phone]

## 2018-03-04 ENCOUNTER — Other Ambulatory Visit: Payer: Self-pay | Admitting: *Deleted

## 2018-03-04 NOTE — Patient Outreach (Signed)
[place] [facility]) Care Management  03/04/2018  Brianna Harris [birth year] [id number]   Transition of Care Referral  Referral Date: 02/26/18 Referral Source: [facility] Urgent Outreach for [facility] Date of Admission:  Diagnosis: s/p AVR gait difficulty decreased strength Date of Discharge:on 02/22/18. Facility: [place]: HTA  Outreach attempt # 2 Patient was reached via interpreter services  CM informed her who CM was and that CM was calling from [facility] to follow up with her after her recent d/c  The interpreter states she hung up the line  [facility] RN CM was NOT  able to leave a HIPAA compliant voicemail message along with CM's contact info.   Plan: [facility] RN CM scheduled this patient for a third call attempt within 4 business days  Brianna Almond L. Lavina Hamman, RN, BSN, [place] Management Care Coordinator Direct Number [phone] Mobile number [phone]  Main [facility] number [phone] Fax number [phone]

## 2018-03-05 DIAGNOSIS — Z09 Encounter for follow-up examination after completed treatment for conditions other than malignant neoplasm: Secondary | ICD-10-CM | POA: Diagnosis not present

## 2018-03-05 DIAGNOSIS — I350 Nonrheumatic aortic (valve) stenosis: Secondary | ICD-10-CM | POA: Diagnosis not present

## 2018-03-05 DIAGNOSIS — E119 Type 2 diabetes mellitus without complications: Secondary | ICD-10-CM | POA: Diagnosis not present

## 2018-03-05 DIAGNOSIS — Z952 Presence of prosthetic heart valve: Secondary | ICD-10-CM | POA: Diagnosis not present

## 2018-03-06 ENCOUNTER — Other Ambulatory Visit: Payer: Self-pay | Admitting: *Deleted

## 2018-03-06 NOTE — Patient Outreach (Signed)
[place] [facility]) Care Management  03/06/2018  Brianna Harris 1961/01/29 [id number]   Transition of Care Referral  Referral Date:02/26/18 Referral Source:HTA Urgent Outreach for [facility] Date of Admission: 02/13/18 Diagnosis:s/p AVR "open heart surgery", gait difficulty, decreased strength Date of Discharge:on 02/22/18. Brianna Harris  Outreach attempt # 3 Successful to home number and referred to her mother's number   CM reached a female at the home using Interpreter [id number]  Cm was informed that the pt stayed in the hospital 8 days and then went to stay with her mother  The mother's number (Brianna Harris) was provided as [phone]    [place] called [phone]  permission given to speak with her mother, Brianna Harris Pt is deaf Brianna Harris, Mother is able to verify HIPAA Reviewed and addressed EMMI red alert/referral to [facility] with patient  Brianna Harris that Brianna Harris is doing well and the only concern is a blister under her breast and encouragement for her to be more active with her care She reports she has had labs and was informed by the primary MD on 03/05/18 that "all looks good" She informed her primary MD she stopped smoking Brianna Harris called [name], [place] office about a small open blister above an old chest tube site today 03/06/18  Brianna Harris cleaned the site and applied gauze as ordered Denies s/s of infection- no bleeding, redness, minimal swelling only sore Brianna Harris also reports Brianna Harris has "gotten her days and nights confused" because she is having trouble finding a comfortable position to sleep because she is sore CM assessed for sob and chest pain but this is denied  There is noted bruising from coumadin and needle sticks Her CBG have been from 130-150s  Her PT/PTT/INR are being monitored by primary MD with assist of home health   Social: Brianna Harris is staying at her mother's home in [place] for her recovery  but generally is at her home with roommate, Brianna Harris (also hearing impaired) in [place]. Her mother reports she is needing assistance with all her care and transportation at this time. Her appetite varies She has overactive bladder and has had one episode of incontinence because she was not able to get to the bathroom in time. She is being seen by Home health RN, PT. She has a wound vac  She is afraid of all needle stick Recently the family has also experienced a loss of a family member, Brianna Harris' brother   Conditions: s/p AVR and pacemaker placement , congenital Deafness, smoker, alcohol abuse, aortic valve stenosis, asthma, chronic depression , DM type 2, hx of drug abuse 1980s, GERD, HTN, HDL, ischemic stroke frontal lobe, overactive bladder,   DME cane, glucose meter monitor  Medications: Her mother Brianna Harris voiced concern that Brianna Harris may not had been taking her medication correctly prior to this hospitalization   Brianna Harris is filling her pill containers. A home health staff spoke with her about pill packing and they have voiced interest in having this completed for a possible return to Brianna Harris also discussed this with her as a good resource to promote medication compliance  After the discharge from [place] Her mother got all new prescription for all of her medication (new and old) filled at [facility]: 03/05/18 seen by new primary MD Dr Harrel Lemon in [place], Thoracic surgeon to be seen on 03/11/18 to have her stitches removed to get  a xray, EKG and labs  Bartholome Bill CV  Advance Directives: Her mother is her POA   Consent: [name] reviewed [facility] services with patient's mother. They gave verbal consent for services for [place].    Plan: [facility] RN CM will refer [place] for assistance with pill packing RN CM provided patient with HTA coverage your 24 hour nurse line number is [phone].   Pt encouraged to return a  call to [facility] RN CM prn    Garet Hooton L. Lavina Hamman, RN, BSN, [place] Management Care Coordinator Direct Number [phone] Mobile number [phone]  Main THN number [phone] Fax number [phone]

## 2018-03-07 ENCOUNTER — Other Ambulatory Visit: Payer: Self-pay | Admitting: *Deleted

## 2018-03-07 DIAGNOSIS — Z9889 Other specified postprocedural states: Secondary | ICD-10-CM | POA: Diagnosis not present

## 2018-03-07 DIAGNOSIS — E785 Hyperlipidemia, unspecified: Secondary | ICD-10-CM | POA: Diagnosis not present

## 2018-03-07 DIAGNOSIS — F329 Major depressive disorder, single episode, unspecified: Secondary | ICD-10-CM | POA: Diagnosis not present

## 2018-03-07 DIAGNOSIS — Z7901 Long term (current) use of anticoagulants: Secondary | ICD-10-CM | POA: Diagnosis not present

## 2018-03-07 DIAGNOSIS — Z7982 Long term (current) use of aspirin: Secondary | ICD-10-CM | POA: Diagnosis not present

## 2018-03-07 DIAGNOSIS — R918 Other nonspecific abnormal finding of lung field: Secondary | ICD-10-CM | POA: Diagnosis not present

## 2018-03-07 DIAGNOSIS — J90 Pleural effusion, not elsewhere classified: Secondary | ICD-10-CM | POA: Diagnosis not present

## 2018-03-07 DIAGNOSIS — Z952 Presence of prosthetic heart valve: Secondary | ICD-10-CM | POA: Diagnosis not present

## 2018-03-07 DIAGNOSIS — E119 Type 2 diabetes mellitus without complications: Secondary | ICD-10-CM | POA: Diagnosis not present

## 2018-03-07 DIAGNOSIS — F17290 Nicotine dependence, other tobacco product, uncomplicated: Secondary | ICD-10-CM | POA: Diagnosis not present

## 2018-03-07 DIAGNOSIS — I350 Nonrheumatic aortic (valve) stenosis: Secondary | ICD-10-CM | POA: Diagnosis not present

## 2018-03-07 DIAGNOSIS — Z8673 Personal history of transient ischemic attack (TIA), and cerebral infarction without residual deficits: Secondary | ICD-10-CM | POA: Diagnosis not present

## 2018-03-07 DIAGNOSIS — K219 Gastro-esophageal reflux disease without esophagitis: Secondary | ICD-10-CM | POA: Diagnosis not present

## 2018-03-07 DIAGNOSIS — R5381 Other malaise: Secondary | ICD-10-CM | POA: Diagnosis not present

## 2018-03-07 DIAGNOSIS — I10 Essential (primary) hypertension: Secondary | ICD-10-CM | POA: Diagnosis not present

## 2018-03-07 DIAGNOSIS — R791 Abnormal coagulation profile: Secondary | ICD-10-CM | POA: Diagnosis not present

## 2018-03-07 DIAGNOSIS — J45909 Unspecified asthma, uncomplicated: Secondary | ICD-10-CM | POA: Diagnosis not present

## 2018-03-07 DIAGNOSIS — H905 Unspecified sensorineural hearing loss: Secondary | ICD-10-CM | POA: Diagnosis not present

## 2018-03-07 NOTE — Patient Outreach (Signed)
[place] [facility]) Care Management  03/07/2018  MACKENZE GRANDISON Feb 03, 1961 [id number]   Care coordination  Ms Minogue's mother called to state the patient's MD office called her [place] home with information about labs vs her mother's home where she is staying after her surgery.  They would like all calls to the mother's home [facility] RN CM called to the MD office to request they call the mother's home with the lab results vs her [place] home number  CM spoke with Olivia Mackie at the MD office to have the MD RN call the mother Provided the contact number    Plan [facility] RN CM will follow up with Ms Sakata and her mother within 7-10 business days  [place] L. Lavina Hamman, RN, BSN, [place] Coordinator Office number [phone] Mobile number [phone]  Main THN number [phone] Fax number [phone]

## 2018-03-10 ENCOUNTER — Other Ambulatory Visit: Payer: Self-pay | Admitting: Pharmacist

## 2018-03-10 MED ORDER — ATORVASTATIN CALCIUM 40 MG PO TABS
40.00 | ORAL_TABLET | ORAL | Status: DC
Start: 2018-03-11 — End: 2018-03-10

## 2018-03-10 MED ORDER — AMITRIPTYLINE HCL 25 MG PO TABS
25.00 | ORAL_TABLET | ORAL | Status: DC
Start: 2018-03-10 — End: 2018-03-10

## 2018-03-10 MED ORDER — METFORMIN HCL ER 500 MG PO TB24
500.00 | ORAL_TABLET | ORAL | Status: DC
Start: 2018-03-10 — End: 2018-03-10

## 2018-03-10 MED ORDER — MECLIZINE HCL 12.5 MG PO TABS
12.50 | ORAL_TABLET | ORAL | Status: DC
Start: ? — End: 2018-03-10

## 2018-03-10 MED ORDER — LAMOTRIGINE 25 MG PO TABS
50.00 | ORAL_TABLET | ORAL | Status: DC
Start: 2018-03-10 — End: 2018-03-10

## 2018-03-10 MED ORDER — GENERIC EXTERNAL MEDICATION
1.00 | Status: DC
Start: ? — End: 2018-03-10

## 2018-03-10 MED ORDER — METOPROLOL TARTRATE 25 MG PO TABS
25.00 | ORAL_TABLET | ORAL | Status: DC
Start: 2018-03-10 — End: 2018-03-10

## 2018-03-10 MED ORDER — MELATONIN 3 MG PO TABS
3.00 | ORAL_TABLET | ORAL | Status: DC
Start: 2018-03-10 — End: 2018-03-10

## 2018-03-10 MED ORDER — LIDOCAINE HCL 1 % IJ SOLN
0.50 | INTRAMUSCULAR | Status: DC
Start: ? — End: 2018-03-10

## 2018-03-10 MED ORDER — PANTOPRAZOLE SODIUM 40 MG PO TBEC
40.00 | DELAYED_RELEASE_TABLET | ORAL | Status: DC
Start: 2018-03-11 — End: 2018-03-10

## 2018-03-10 MED ORDER — ASPIRIN 81 MG PO CHEW
81.00 | CHEWABLE_TABLET | ORAL | Status: DC
Start: 2018-03-11 — End: 2018-03-10

## 2018-03-10 MED ORDER — ACETAMINOPHEN 325 MG PO TABS
650.00 | ORAL_TABLET | ORAL | Status: DC
Start: ? — End: 2018-03-10

## 2018-03-10 MED ORDER — GENERIC EXTERNAL MEDICATION
3.00 | Status: DC
Start: 2018-03-10 — End: 2018-03-10

## 2018-03-10 NOTE — Patient Outreach (Addendum)
[facility]) Care Management  [place]   03/10/2018  ELIM PEALE 03-Dec-1960 [id number]   Reason for referral: medication review, discuss medication compliance packs   Referral source: HTA Current insurance:HTA  PMHx: current tobacco abuse (1 pack/day, 40 pack year history), previous heavy EtOH and cocaine abuse, deafness, gait difficulty, asthma, depression, insomnia, T2DM, GERD, HTN, HLD, hx ischemic stroke, hx aortic valve stenosis, noted patient recently hospitalized for AVR and pacemaker placement at [facility] now on warfarin therapy.  Per review of EMR, patient currently hospitalized since 03/07/2018 for subtherapeutic INR.   Outreach:  Unsuccessful telephone call attempt #1 to patient's mother, Brianna Harris.   HIPAA compliant voicemail left requesting a return call  Plan:  I will mail patient an unsuccessful outreach letter, I will make another outreach attempt to patient within 3-4 business days   Ralene Bathe, PharmD, [place] [phone]    Addendum: Incoming call from patient's mother, Brianna Harris. HIPAA identifiers verified. Mother has several questions about setting up compliance packaging after patient returns to her home in [place].  Mother requests that we talk more later this week as she does not have to review medications right now as patient just got home from hospital.    Plan: Await callback from [place].  Will outreach again in 3-4 business days if I have not heard back yet.   Ralene Bathe, PharmD, [place] [phone]

## 2018-03-12 ENCOUNTER — Other Ambulatory Visit: Payer: Self-pay | Admitting: *Deleted

## 2018-03-12 NOTE — Patient Outreach (Signed)
[place] [facility]) Care Management  03/12/2018  CHEVY VIRGO Jul 08, 1960 [id number]   Transition of Care Referral   Referral Date: 03/12/18 Referral Source: HTP IP discharges Date of Admission: 03/07/18 Diagnosis: Subtherapeutic anticoagulation, admitted for Heparin bridge Date of Discharge: on 03/10/18 Facility: [place]:  HTA  Conditions:11/2 AVR (19 mm St Jude mechanical valve Outreach attempt # 1 No answer. [name] left HIPAA compliant voicemail message along with CM's contact info.   Plan: [facility] RN CM scheduled this THN engaged patient for another call attempt within 4 business days   Majid Mccravy L. Lavina Hamman, RN, BSN, [place] Management Care Coordinator Direct Number [phone] Mobile number [phone]  Main THN number [phone] Fax number [phone]

## 2018-03-13 ENCOUNTER — Ambulatory Visit: Payer: Self-pay | Admitting: Pharmacist

## 2018-03-13 ENCOUNTER — Other Ambulatory Visit: Payer: Self-pay | Admitting: Pharmacist

## 2018-03-13 ENCOUNTER — Other Ambulatory Visit: Payer: Self-pay | Admitting: *Deleted

## 2018-03-13 NOTE — Patient Outreach (Signed)
[facility] [facility]) Care Management  [place]  03/13/2018  Brianna Harris 1960-11-09 [id number]   Reason for referral: medication review, discuss medication compliance packs   Unsuccessful telephone call attempt #2 to patient's mother, [name].    HIPAA compliant voicemail left requesting a return call   Noted plans for cardiology to be in charge of INR monitoring, home health checking twice weekly for now and calling in results to Dr. Bethanne Ginger office, last INR 2.0 on 03/11/2018 (goal 2.0-3.0) for mechanical aortic valve replacement.    Plan:  I will make another outreach attempt to patient within 3-4 business days   Ralene Bathe, PharmD, [place] [phone]

## 2018-03-13 NOTE — Patient Outreach (Addendum)
[place] [facility]) Care Management  03/13/2018  LORAN AUGUSTE 1960-05-27 [id number]   Transition of Care Referral  Referral Date: 03/12/18 Referral Source: HTP IP discharges Date of Admission: 03/07/18 Diagnosis: Subtherapeutic anticoagulation, admitted for Heparin bridge Date of Discharge: on 03/10/18 Facility: [place]:  HTA  Conditions: Recent 11/15-18/2019 [facility] admission for low INR, 02/22/18  AVR (19 mm St Jude mechanical valve and pacemaker placement, HTN, HLD, DM seizures, bipolar,congenital Deafness, smoker, alcohol abuse, aortic valve stenosis, asthma, chronic depression , DM type 2, hx of drug abuse 1980's, GERD,  ischemic stroke frontal lobe, overactive bladder  Patient's mother, [name] returned a call to [facility] RN CM She left a voice message. [facility] RN CM returned a call to her but there was not an answer and her voice mail box has not been set up to allow CM to leave a voice message  [facility] RN CM called again and finally reach [name]  She was able to provide HIPAA CM reviewed the reason for the call to follow up on transition of care after 03/10/18 d/c from [place] RN visited on Tuesday 03/11/18. The [facility] RN is scheduled to visit twice a week to check INR to prevent re admission Next RN visit on Friday [facility] PT visited on 03/12/18   Monitoring of INR while on coumadin -There has been a switched to the Cardiologist, Dr Ubaldo Glassing, to monitor the INR Elida's coumadin is now at 3 mg [place] was scheduled to return to Dr Edwina Barth, primary MD for 03/26/18 labs but this was cancelled  [name] voiced interest in DME to check her INR at home. Her mother called HTA for assistance with this DME-HTA to review and call Mother back   CM discussed with [name] the possibility of speaking with Dr Ubaldo Glassing about anticoagulation medication in which frequent monitoring of INR is not needed. [name] reports she has discussed this and  Chancy has to be on coumadin related to the type of metal aorta valve she received  Finances [name] reports concern with [place] being able to pay her bills. [name] reports Aryana receives a disability, continues to pay 1/2 the rent ($450) for her [place] apartment which is due 03/23/18. Zailynn pays for transportation services when she is in [place]   [name], [place], receives Tyreisha's award letter.     Plan: [place] RN CM scheduled this patient for another call attempt within 7 business days [facility] RN CM refer patient to [facility] for assistance with financial resources related to hospital bills and rent and transportation resources for when she returns to [place] (Presently in [place]  with her mother) Please call the Mother's number for contact to patient as she is also hearing impaired Routed note to primary MD and cardilogy  Joelene Millin L. Lavina Hamman, RN, BSN, [place] Management Care Coordinator Direct Number [phone] Mobile number [phone]  Main THN number [phone] Fax number [phone]

## 2018-03-14 ENCOUNTER — Ambulatory Visit: Payer: Self-pay | Admitting: *Deleted

## 2018-03-14 ENCOUNTER — Other Ambulatory Visit: Payer: Self-pay | Admitting: *Deleted

## 2018-03-14 NOTE — Patient Outreach (Signed)
[place] [facility]) Care Management  03/14/2018  [name] 06-Apr-1961 [id number]   Patient referred to this social worker to assist with resources for financial resources related to hospital bills, transportation and rent. Patient is currently residing with her mother in [place] but will be returning back to [place].  This Education officer, museum spoke with patient's  mother as requested, who asked for a return call next week as patient's [facility] health nurse was completing a visit. Per patient's mother, patient's INR was up and they were waiting on a return call from her doctor.   Plan: This social worker will call patient back within 1 week to discuss financial needs and community resources.   Sheralyn Boatman [facility] Care Management [phone]

## 2018-03-17 ENCOUNTER — Other Ambulatory Visit: Payer: Self-pay | Admitting: *Deleted

## 2018-03-17 ENCOUNTER — Other Ambulatory Visit: Payer: Self-pay | Admitting: Pharmacist

## 2018-03-17 ENCOUNTER — Ambulatory Visit: Payer: Self-pay | Admitting: Pharmacist

## 2018-03-17 NOTE — Patient Outreach (Signed)
[place] [facility]) Care Management  03/17/2018  ARIANNE KLINGE 1960/09/16 [id number]   Care coordination  [facility] RN CM received a message left by the patient's mother to state she was unable to speak with the [facility] SW on last week because the [facility] RN came to visit.  [name] wanting assist to be connected back with the [facility] SW. She reports not being able to find the number in her mobile phone.   [name] returned a call to provide [facility] RN SW contact number and encouraged a call to [place] continues to have INR checked by [facility] RN and is scheduled to be seen today, 03/17/18, by [facility] RN for INR check  Mother stating pt is getting "bored" and wanting to return to [place]. Mother voices concern about present INR and wanting to allow pt only visit to [place] call from [facility] SW to review result of contact with mother. Refer [facility] SW notes. THN SW case closed  [facility] pharmacy staff sent message that [employer] services are closed  Plan [facility] RN CM will follow up with [name] and patient within 10 business days and provided a warm transfer to [place] when she returns to [place]. Lavina Hamman, RN, BSN, [place] Coordinator Office number [phone] Mobile number [phone]  Main THN number [phone] Fax number [phone]

## 2018-03-17 NOTE — Patient Outreach (Signed)
[facility]) Care Management [place]  03/17/2018  TZIPORA MCINROY November 06, 1960 [id number]  Reason for referral: medication review, discussmedication compliance packs  Successful call to Ms. Karstyn Whitaker's mother, [name].  [name] reports she is not going to pursue compliance packs at this time as patient is taking warfarin with several dose changes in the last week due to fluctuating INR.  I agreed with patient's mother that warfarin should not be included in a compliance pack even if the dose does stabilize however other scheduled medications could be still organized into packs.  Patient's mother states she is not interested at this time with compliance packs but will contact me if she decides to pursue it in the future.  She denies medication review at this time and states no other medication related questions or concerns.    [facility] pharmacy case is being closed due to the following reasons:  The patient has chosen to discontinue services.  Patient has been provided [facility] CM contact information if assistance needed in the future.    Thank you for allowing [facility] pharmacy to be involved in this patient's care.    Ralene Bathe, PharmD, [place] [phone]

## 2018-03-17 NOTE — Patient Outreach (Signed)
[place] [facility]) Care Management  03/17/2018  Brianna Harris 08/26/1960 [id number]   Patient referred to this social worker to assist with community resources for assistance with her medical bills, transportation and rent. Per patient's mother, she is patient's POA. She discussed patient having had multiple hospital stays and procedures that have caused her medical bills to accumulate. [name] social worker suggested that she call each hospital where patient has received services to discuss applying for hardship on her behalf as patient is on a fixed income. Patient resides with a roommate in [place]. She pays her roommate monthly rent ($450.00) in addition to providing transportation to medical appointments for a fee. Alternative placement discussed, such as group home placement or residing with her mother on a full time basis, however patient has declined this. Per patient's mother, patient has tried living in a group home before, however felt very isolated as she was the only one there that was hearing impaired. She had no one to communicate with. Patient does not want to live with her mother due to the fact that she has made a life for herself having friends and a church family that are also hering impaired.  Patient's mother states that patient will eventually move back to [facility] once her INR is stable but for the time being patient will reside with her.. Patient 's mother still plans to visit patient regularly when patient moves back to her own home specifically to assist with her medications.  Patient's mother verbalized no additional community resource needs at this time and agrees to contact all facilities that patient has received treatment from [facility] and [facility] to apply for hardship. Per patient's mother, she has alredy completed paper work for [facility] but has not heard back from them yet. This Education officer, museum to sign off at this time, however  patient's mother encouraged to call this Education officer, museum with any questions or additional community resource needs.  [facility] Care Management [phone]

## 2018-03-17 NOTE — Patient Outreach (Signed)
[place] [facility]) Care Management  03/17/2018  Brianna Harris March 03, 1961 [id number]  57 year old female outreached by [place] services for a 30 day post discharge medication review.  PMHx includes, but not limited to, hypertension, asthma, GERD, s/p aortic valve replacement, deafness, hyperlipidemia.   Unsuccessful telephone call attempt #3. Will route note to pharmacist Joetta Manners at this time.   Gwenlyn Found, [name] D PGY1 Pharmacy Resident  Phone [phone] 03/17/2018   11:09 AM

## 2018-03-18 ENCOUNTER — Ambulatory Visit: Payer: Self-pay | Admitting: *Deleted

## 2018-03-24 ENCOUNTER — Other Ambulatory Visit: Payer: Self-pay | Admitting: *Deleted

## 2018-03-24 NOTE — Patient Outreach (Signed)
[facility]) Care Management  03/24/2018  AULANI SHIPTON 06/01/1960 [id number]   Care coordination   [facility] RN CM received a return call from [place] to stated the other [place] will be visiting Ms Intrieri on Tuesday 03/25/18 between 10-12 am [facility] RN CM called to inform [name] of this on her mobile number  She and Crystal went to pick up medicine from the pharmacy  Plans assist [place] RN CM as needed   [employer] Lavina Hamman, RN, BSN, [place] Coordinator Office number [phone] Mobile number [phone]  Main THN number [phone] Fax number [phone]

## 2018-03-24 NOTE — Patient Outreach (Signed)
Brianna Harris  03/24/2018  Brianna Harris Jun 28, 1960 [id number]   Care coordination- INR check for today    [facility] RN CM received a voice message from Mrs Brianna Harris about home health visit for INR, not receiving any calls for an update  CM called and spoke with Brianna Harris, scheduler, and Brianna Harris, Publishing copy, at [place] who confirms the order they have is for an INR check only on Tuesdays. The mother has been informed by Brianna Harris home health staff of this today. CM discussed with Brianna Harris that the mother states she was informed that it was twice a week.  Cm confirmed that during the conversation with Brianna Bethanne Ginger RN Maudie Mercury, there were telephone issues prior to this discussion. Cm voiced appreciation for their assistance and discussed speaking with Brianna Harris office about frequency of INR check  [facility] RN CM called and spoke with Brianna Harris at Brianna Harris office Maudie Mercury RN on lunch).  Discussed call from Mrs Brianna Harris and the call to Brianna Harris at [facility] resulting in there only being an order for Tuesday INR checks not bi weekly order. Cm inquired if this could be discussed with Brianna Harris and for orders to be sent to [facility] home health if bi weekly INR checks are need,  Brianna Harris mentioned plan for INR checks when Brianna Harris return to [place] is to have Brianna Harris return to Brianna Harris office for venipuncture checks vs finger stick checks. When asked CM confirmed Cm was informed on today by Mrs Brianna Harris that her insurance carrier denied DME for a home INR monitor.  [name] confirmed that [facility] RN CM does not have any involvement with Health team advantage's decision on approval or denial for DME.   [name] discussed with Brianna Harris that Mrs Brianna Harris confirms Brianna Harris does not like venipunctures, is fearful of them related to her "bipolar" issues and did not behave well for venipunctures at Brianna Harris. This information will be shared with Brianna Harris and Maudie Mercury, RN per Brianna Harris  Cm received a call from Maudie Mercury, At Brianna Harris  office CM clarified that Brianna Harris is not home health, and not health team advantage. Discussed the THN role in Brianna Harris's care. Maudie Mercury continues to [employer] was faxed an order for bi weekly checks not weekly and she states [employer] RN clarifies this. Cm shared with Maudie Mercury, RN,  that [place] confirm only an order for once a week INR check on Tuesdays  This CM inquired if the order could be re faxed.  Discussed plan for INR check when return to [facility] is for the patient to come to the office.   Spoke with Mrs Brianna Harris and updated her that the INR will be checked on 03/25/18 as confirmed with [facility] home health clinical manager and Maudie Mercury, Therapist, sports. Cm discussed the entered order for [facility] community RN CM and the transfer from telephonic to community. She voiced appreciation that [place] returned a call to her She now wants to know the time of the visit on 03/25/18 CM explained she did not know this. Updated Epic with Mrs Brianna Harris mobile number and end date for temporary contact  [name] left a message for [employer] home health RN. [place] returned a call to CM to state she will not be seeing Brianna Douds on 03/25/18 another RN will be and she will try to find out the time and let CM know   Plan This patient was  referred to [place] CM in preparation for her needs when she returns to [facility]) from her mother's home in [place]. (the mother states the patient is ready to return this week if possible)   Joelene Millin L. Lavina Hamman, RN, BSN, [place] Coordinator Office number [phone] Mobile number [phone]  Main THN number [phone] Fax number [phone]

## 2018-03-24 NOTE — Patient Outreach (Signed)
Care coordination- coumadin INR, DME, return to [place] RN CM received a call from Brianna Harris, patient's mother with concern that the pt is needing her lab for  INR taken today 03/24/18 and is not able to get [name] to complete a lab draw today 03/24/18 (Home heath staff was a no show at 0930 03/24/18 per mother) There is also difficulty with reaching Dr Bethanne Ginger office Last INR was checked on Wednesday 03/19/2018 at 1.6 Patient's INR range should be between 2-3  There was some discrepancy with whether to administer the coumadin for Thursday 03/20/18 with the [facility] RN but the Cardiology office RN, Brianna Harris resolved this. Coumadin was given     [name] RN CM made 2 attempts to reach Dr Ubaldo Glassing office [phone]) while on the line with Brianna Harris with no success (busy signals)  [name] RN CM called to [name] [phone]) without success for the [name] staff with Brianna Harris on the line [facility] RN CM had to leave a message for Brianna Harris of the [facility] team requesting a return call  Brianna Harris to attempt to reach a friend in [name] to have the office return a call to Brianna Harris    Brianna Harris also confirms Brianna Harris, patient, was denied for the INR home monitor by the insurance carrier. A letter was received  Recovery Brianna Harris reports Brianna Harris is recovering well and wanting to return to [facility] but Brianna Harris is cautious about returning related to obtaining a dependable plan for INR checks and coumadin administration  [name] RN Cm was able to reach staff in Dr Ubaldo Glassing office, Brianna Harris to discussed the concern with lab draw for 03/24/18 plus CM ask that Dr Ubaldo Glassing staff let CM know of the plan for lab draws once pt is to return to [name].    Brianna Harris confirms a call was received prior to CM's from Brianna Harris. [name] also confirmed this when Brianna Harris left a voice message for [name] RN CM while CM was on the line with Brianna Harris.   [name] called back to Brianna  Brianna Harris who reports that Brianna Harris Dr Bethanne Ginger RN called her to state Brianna Harris will call [place] [facility] to remind them of the standing order to check patient twice a week for INR and that when Brianna Harris return to [name] she is to have INR checked at the MD office. CM discussed introducing [place] staff for preparation to return to [place] and Brianna Harris agreed    Brianna Harris from Dr Ubaldo Glassing office called to confirm she is attempting to assist with lab draws. Noted difficulty with phone concerns and eventually disconnected from the call. [name] attempted to reach Brianna Harris again at the office number but continued to get busy signals.    Plan Referred Brianna Harris to [place] RN CM for disease management, coumadin management (followed by Dr Bartholome Bill, CV), High ER/Hospitalization Utilization History/Readmission Prevention (admissions on 02/13/18 to 02/22/18 and 11/15-18/19 to [facility]), Hx of non adherent and Pt/Family has Health Education Needs        Routed note to MDs  Brianna Harris L. Lavina Hamman, RN, BSN, [place] Coordinator Office number [phone] Mobile number [phone]  Main [facility] number [phone] Fax number [phone]

## 2018-03-24 NOTE — Patient Outreach (Signed)
[place] [facility]) Care Management  03/24/2018  [name] 02-19-1961 [id number]   Additional referral received from [place] to assist patient with community resources, specifically [facility] and transportation for when patient returns home to [place] form her mother's home in [place].  This Education officer, museum had a conversation with patient's mother  previously regarding this, however will review again.  Unsuccessful call to patient's mother made. Voicemail message left for a return call.   Sheralyn Boatman [facility] Care Management [phone]

## 2018-03-25 ENCOUNTER — Other Ambulatory Visit: Payer: Self-pay | Admitting: *Deleted

## 2018-03-25 NOTE — Patient Outreach (Signed)
[facility]) Care Management  03/25/2018  Brianna Harris 1961/03/26 [id number]  Referral received from UM to review community resources. Per patient's mother, patient is ready to go back to her own home in [place], however they are waiting on the [facility] nurse to come to check  Patient's  INR today before she leaves. If her levels are good patient will go home. Per patient's mother " everything else seems to be okay". Patient's mother, is very concerned about patient's ability to manage her lab draws and possible dosage changes. Patient's mother would like HH to continue once patient moves back home but understands that patient will have to go to the lab for her lab draws moving forward. [facility] [place] services have not been extended.. Patient's mother plans to continue to follow patient closely when she moves back home to [facility] and will continue to fill her pill box weekly.  Patient's mother welcomes the suggestion to educate patient's roommate regarding patient's need for regular lab draws and potential dosage changes. Patient's roommate will continue to provide the transportation to medical appointments if patient's mother is not available. Patient has a dictation phone at home, however patient's mother suggested a sign language interpreter when speaking to patient face to face. Patient's mother agreeable to [facility] care management services following patient when she returns home.   Plan: This Education officer, museum will follow up with patient within 2 weeks to assess for community resource needs once she transitions home.   Brianna Harris [facility] Care Management [phone]

## 2018-03-25 NOTE — Patient Outreach (Signed)
[place] [facility]) Care Management  03/25/2018  Brianna Harris 1960-07-31 [id number]  Care coordination  [facility] RN CM collaborated with [place] on the pt's plan of care for INR monitoring and return to [place]. Lavina Hamman, RN, BSN, [place] Coordinator Office number [phone] Mobile number [phone]  Main [facility] number [phone] Fax number [phone]

## 2018-03-27 ENCOUNTER — Other Ambulatory Visit: Payer: Self-pay | Admitting: *Deleted

## 2018-03-27 ENCOUNTER — Encounter: Payer: Self-pay | Admitting: *Deleted

## 2018-03-27 NOTE — Patient Outreach (Signed)
[place] [facility]) Care Management [place] Telephone Outreach  03/27/2018  LAILANI TOOL [birth year] [id number]  Unsuccessful telephone outreach to Brianna Harris, mother/ caregiver of Geneal Huebert, 57 y/o female referred to [place] by [facility] telephonic RN CM on recent UM/ insurance referral.  Noted through review of EMR that patient had recent hospitalization at [facility] February 22, 2018 for AVR; apparently, patient was re-hospitalized at [facility] November 15-18, 2019 for concerns around low INR values.  [name], and referral requested that I contact patient's mother, as patient is deaf.  Patient has history including, but not limited to, congenital deafness; DM- type II; HTN/ HLD; seizures; bipolar disorder; tobacco/ ETOH use-abuse; AV stenosis; asthma; GERD; depression; and previous CVA.  HIPAA compliant voice mail message left for patient's mother, requesting return call back.  Plan:  Will re-attempt [place] telephone outreach again next week if I do not hear back from patient/ caregiver first.  Oneta Rack, RN, BSN, [place] Coordinator [facility] Care Management  [phone]

## 2018-03-28 ENCOUNTER — Encounter: Payer: Self-pay | Admitting: *Deleted

## 2018-03-28 ENCOUNTER — Other Ambulatory Visit: Payer: Self-pay | Admitting: *Deleted

## 2018-03-28 DIAGNOSIS — Z7901 Long term (current) use of anticoagulants: Secondary | ICD-10-CM | POA: Diagnosis not present

## 2018-03-28 NOTE — Patient Outreach (Signed)
Brianna Harris) Care Management [place] Telephone Outreach  03/28/2018  Brianna Harris Dec 17, 1960 [id number]  Successful telephone outreach to Brianna Harris, [name], 57 y/o female referred to [place] by [facility] telephonic RN CM on recent UM/ insurance referral.  Noted through review of EMR that patient had recent hospitalization at [facility] February 22, 2018 for AVR; apparently, [name], 2019 for concerns around low INR values.  [name], and referral requested that I contact patient's mother, as patient is deaf.  Patient has history including, but not limited to, congenital deafness; DM- type II; HTN/ HLD; seizures; bipolar disorder; tobacco/ ETOH use-abuse; AV stenosis; asthma; GERD; depression; and previous CVA.  Patient's mother returned my call from yesterday; HIPAA/ identity verified during call today.  [place] services were discussed with patient's caregiver/ mother who states that she is patient's "POA."  Caregiver reports that patient has now returned to her own home in [place], where she lives with a roommate, who is also deaf.  Discussed need to speak with patient directly to obtain verbal consent from patient to discuss health issues with mother; scheduled call with patient through her mother for next week, post-upcoming cardiology appointment; encouraged patient's caregiver/ mother to make patient aware that I will be contacting her at scheduled time next week; mother agrees to do so.  Caregiver denies that patient is in distress, states that she spoke with patient by phone yesterday.  Caregiver further reports:  Medications: -- Has all medicationsand takes as prescribed;denies questions about current medications, however, at time of our call, I noted that caregiver lists several medications that are  not currently on her medication list in EMR; I encouraged caregiver to take all of patient's medications with her to provider appointments for current review by care providers; mother agrees to do so.  -- Verbalizes good general understanding of the purpose, dosing, and scheduling of medications.   -- caregiver manages medications using pill planner box, patient takes independently; caregiver states today that her primary goal is for patient's coumadin dosing to be managed, as the dose changes around patient's INR-- positive reinforcement provided; for now, patient's caregiver is managing coumadin dosing, and communicates with patient's cardiology team around dosing guidelines as they change based on INR -- denies issues with swallowing medications -- patient/ caregiver were recently contacted by [facility] Pharmacist of 03/17/18, and declined option of compliance/ blister packaging; caregiver continues to decline this option today, and declines further needs from [place] team.   -- caregiver verbalizes that she would like assistance with having someone in [place] to adjust patient's coumadin dosing after each INR is obtained- explained that [place] does not complete regular visits for medication management/ pill box filling; discussed options of patient hiring private assistance or having support through family and friends to assist.  At this Harris, caregiver states that she will continue managing coumadin with dosing changes, as neither she nor patient are willing to "pay extra" for this service.    Home health [facility]) services: -- [facility] services post-hospital discharge "now complete," "signed off" as patient "is doing so well."  Provider appointments: -- All upcoming provider appointments were reviewed with caregiver today- patient has scheduled INR visit this morning and cardiology appointment scheduled for Tuesday April 01, 2018; caregiver states she will be transporting patient to cardiology  appointment next week, and states that patient's landlord  took her to INR testing appointment this morning  Social/ [employer] needs: -- currently denies community resource needs, stating supportive family members that assist with care needs as indicated; states that patient "lives in a deaf community and has a lot of friends and support through her church." -- family or friends currently provides transportation for patient to all provider appointments, errands, etc-- discussed with caregiver that [facility] CSW is also involved in patient's care and encouraged caregiver to maintain communication with [facility] CSW around care needs; discussed that I would be collaborating with [facility] CSW around possible options for medication management community resources   Patient's caregiver denies further issues, concerns, or problems today.  I provided/ confirmed that patient has my direct phone number, the main [facility] CM office phone number, and the [facility] CM 24-hour nurse advice phone number should issues arise prior to next scheduled [employer] outreach.  Encouraged caregiver or patient to contact me directly if needs, questions, issues, or concerns arise prior to next scheduled outreach next week to both patient and caregiver post-upcoming scheduled cardiology appointment; caregiver agreed to do so.  Plan:  Patient will take medications as prescribed and will attend all scheduled provider appointments  Patient/ caregiver will promptly schedule post-hospital discharge office visit with PCP  Patient will promptly notify care providers for any new concerns/ issues/ problems that arise  I will make patien't PCP aware of [facility] RN CM involvement in patient's care-- will send barriers letter  [place] outreach to continue with scheduled phone call to patient/ caregiver next week  [facility] CM Care Plan Problem One     Most Recent Value  Care Plan Problem One  Medication management of coumadin, as  evidenced by patient caregiver reporting  Role Documenting the Problem One  Care Management [place] for Problem One  Active  [facility] Long Term Goal   Over the next 45 days, patient/ caregiver will verbalize ongoing plan for medication management of coumadin, as evidenced by patient/ caregiver reporting during [place] outreach   [facility] Long Term Goal Start Date  03/28/18  Interventions for Problem One Long Term Goal  Discussed with patient's caregiver her concerns around patient's ongoing management of coumadin management,  discussed current plan in place,  [facility] CM program initiated  [facility] CM Short Term Goal #1   Over the next 30 days, patient will attend all provider appointments, as evidenced by patient/ caregiver reporting and collaboration with care providers as indicated during [place] outreach  [facility] CM Short Term Goal #1 Start Date  03/28/18  Interventions for Short Term Goal #1  Reviewed with patient's caregiver upcoming appointments to INR/ coumadin clinic and cardiology provider,  confirmed that patient's mother will transport patient to cardiology appointment and confirmed that patient has transportation to coumadin INR appointment this morning  [facility] CM Short Term Goal #2   Over the next 14 days, patient will schedule an appointment with her PCP for medication review and medication list update, as evidenced by patient/ caregiver reporting during [place] outreach  [facility] CM Short Term Goal #2 Start Date  03/28/18  Interventions for Short Term Goal #2  Discussed need for prompt PCP appointment for medication review and update post-recent hospitalization at [facility],  encouraged caregiver to make prompt appointment with patient's PCP     I appreciate the opportunity to participate in [place]'s care,  Oneta Rack, RN, BSN, [place]  Management  [phone]

## 2018-04-01 ENCOUNTER — Ambulatory Visit: Payer: PPO | Admitting: *Deleted

## 2018-04-01 DIAGNOSIS — Z7901 Long term (current) use of anticoagulants: Secondary | ICD-10-CM | POA: Diagnosis not present

## 2018-04-01 DIAGNOSIS — I10 Essential (primary) hypertension: Secondary | ICD-10-CM | POA: Diagnosis not present

## 2018-04-01 DIAGNOSIS — E119 Type 2 diabetes mellitus without complications: Secondary | ICD-10-CM | POA: Diagnosis not present

## 2018-04-01 DIAGNOSIS — R001 Bradycardia, unspecified: Secondary | ICD-10-CM | POA: Diagnosis not present

## 2018-04-01 DIAGNOSIS — I350 Nonrheumatic aortic (valve) stenosis: Secondary | ICD-10-CM | POA: Diagnosis not present

## 2018-04-01 DIAGNOSIS — K219 Gastro-esophageal reflux disease without esophagitis: Secondary | ICD-10-CM | POA: Diagnosis not present

## 2018-04-03 ENCOUNTER — Other Ambulatory Visit: Payer: Self-pay | Admitting: *Deleted

## 2018-04-03 ENCOUNTER — Encounter: Payer: Self-pay | Admitting: *Deleted

## 2018-04-03 NOTE — Patient Outreach (Signed)
Brianna Harris) Care Management [name] Telephone Outreach  04/03/2018  Brianna Harris November 15, 1960 [id number]  Successful telephone outreachto Brianna Harris, 57 y/o femalereferred to [name] by [facility] telephonic RN CM on recent UM/ insurance referral. Noted through review of EMR that patient had recent hospitalization at [facility] February 22, 2018 for AVR; apparently, patient was re-hospitalized at [name] November 15-18, 2019 for concerns around low INR values. Patient has history including, but not limited to, congenital deafness; DM- type II; HTN/ HLD; seizures; bipolar disorder; tobacco/ ETOH use-abuse; AV stenosis; asthma; GERD; depression; and previous CVA.  HIPAA/ identity verified with patient today and [name] services were discussed with patient; explained that I had been in touch with her mother, and was calling today to follow up.  Patient provides verbal permission to speak with her mother "any time," and she provides verbal consent for [place] involvement in her care.    Patient states she is "doing great" today, and she denies pain, new/ recent falls.  Reports that she has all of her medications and is taking as prescribed; denies concerns or questions around medications.  Patient reports "some soreness" at pacemaker incision, but states that she attended cardiology provider appointment this week and was told pacemaker site "looks fine."  Patient stated several times throughout our conversation that "everything is going fine."  Placed follow up call to patient's mother/ caregiver, Brianna Harris, who reports that she transported patient to and attended with patient recent cardiology visit on April 01, 2018; explained that I had spoken to patient, who reported that everything around her care was going well. Caregiver agrees with this and stated that she filled patient's medication box for 2 weeks after taking her to  the cardiology provider office visit this week.  Caregiver reports she talks to patient on phone every night and checks to make sure she has taken her medication.  We discussed option of patient being put on another medication for anticoagulation, which would not require frequent lab testing or changing doses; caregiver stated that they were told that patient "had to be on coumadin," and that patient "did not qualify for" other options for anticoagulation.  [facility] CSW remains involved in patient's care for ongoing evaluation of community resource needs- encouraged patient's caregiver to maintain communication with [name].  Caregiver further reports:  Medications: -- declines medication review today, stating time constraints; stated that neither she nor patient have ongoing questions around medications since recent cardiology provider office visit -- again stated that she was interested in having someone go to patient's home on a regular basis to make sure that she was taking her medications as prescribed; I again discussed that these services are limited and that caregiver may want to look into private duty options -- caregiver stated that she was not interested in obtaining private duty assistance for this at this time and stated that she would continue managing patient's medications for now -- discussed with caregiver importance of making sure that all medications are taken accurately/ as instructed, caregiver verbalizes understanding and complete agreement; discussed signs/ symptoms bleeding in setting of ACT and caregiver reports that she is "already aware of" these; encouraged her and patient to promptly report any signs/ symptoms bleeding or any traumatic falls or injuries  Provider appointments: -- upcoming provider appointments were reviewed with caregiver today- patient has scheduled pacemaker follow up appointment "sometime in January;" states she will provide transportation to this appointment  and  attend appointment with patient -- still has not scheduled PCP office visit; states "she is seeing her cardiologists- I don't know why we need to go see her PCP;" discussed value of overall medication review with patient's caregiver and encouraged her to promptly schedule an appointment with PCP, and she stated she "would think about it."  -- INR lab appointment scheduled in 2 weeks; caregiver reports that patient continues being transported to these appointments by friends/ roommates, land lord, etc  Patient's caregiver denies further issues, concerns, or problems today, and initially stated that if I could not go to patient's home regularly and ensure that she was taking her medications on a regular basis, that she did not believe I needed to contact her again; I explained that I would either remain involved or close [place] CM program, based on her preference/ request.  Caregiver then abruptly said that I "could call her again in a couple of weeks" to "see how things were going."   I re- provided/ confirmed that patient/ caregiver both havemy direct phone number, the main [facility] CM office phone number, and the [facility] CM 24-hour nurse advice phone number should issues arise prior to next scheduled [place] outreach by phone in 2 weeks.  Encouraged caregiver or patient to contact me directly if needs, questions, issues, or concerns arise prior to next scheduled outreach in 2 weeks to caregiver and she agreed to do so.  Plan:  Patient will take medications as prescribed and will attend all scheduled provider appointments  Patient/ caregiver will promptly schedule post-hospital discharge office visit with PCP  Patient will promptly notify care providers for any new concerns/ issues/ problems that arise  [place] outreach to continue with scheduled phone call to patient/ caregiver in 2 weeks  [facility] CM Care Plan Problem One     Most Recent Value  Care Plan Problem One   Medication management of coumadin, as evidenced by patient caregiver reporting  Role Documenting the Problem One  Care Management [place] for Problem One  Active  [facility] Long Term Goal   Over the next 45 days, patient/ caregiver will verbalize ongoing plan for medication management of coumadin, as evidenced by patient/ caregiver reporting during [place] outreach   [facility] Long Term Goal Start Date  03/28/18  Interventions for Problem One Long Term Goal  Confirmed with patient and caregiver that patient is taking medications as prescribed,  confirmed that both patient and caregiver deny current concerns around medications, and that both patient and caregiver do not have questions around medications  [facility] CM Short Term Goal #1   Over the next 30 days, patient will attend all provider appointments, as evidenced by patient/ caregiver reporting and collaboration with care providers as indicated during [facility] RN CCM outreach  [facility] CM Short Term Goal #1 Start Date  03/28/18  Interventions for Short Term Goal #1  Confirmed that patient attended recent cardiology follow up visit and has attended all coumadin clinic appointments,  confirmed that patient continues using friends/ family members to transport to appointments,  again discussed transportation options with caregiver, and encouraged caregiver to maintain contact with [facility] CSW  [facility] CM Short Term Goal #2   Over the next 14 days, patient will schedule an appointment with her PCP for medication review and medication list update, as evidenced by patient/ caregiver reporting during [facility] RN CCM outreach [Goal re-established today]  [facility] CM Short Term Goal #2 Start Date  04/03/18 [goal re-established today]  Interventions for Short Term Goal #2  Discussed with caregiver value of having prompt PCP follow up post- recent hospitalization,  discussed need for PCP medication review     Oneta Rack, RN, BSN, [employer] Coordinator [facility]  Care Management  [phone]

## 2018-04-04 ENCOUNTER — Ambulatory Visit: Payer: Self-pay | Admitting: *Deleted

## 2018-04-07 ENCOUNTER — Other Ambulatory Visit: Payer: Self-pay | Admitting: *Deleted

## 2018-04-07 ENCOUNTER — Ambulatory Visit: Payer: Self-pay | Admitting: *Deleted

## 2018-04-07 DIAGNOSIS — E119 Type 2 diabetes mellitus without complications: Secondary | ICD-10-CM | POA: Diagnosis not present

## 2018-04-07 DIAGNOSIS — F329 Major depressive disorder, single episode, unspecified: Secondary | ICD-10-CM | POA: Diagnosis not present

## 2018-04-07 DIAGNOSIS — H9193 Unspecified hearing loss, bilateral: Secondary | ICD-10-CM | POA: Diagnosis not present

## 2018-04-07 DIAGNOSIS — I10 Essential (primary) hypertension: Secondary | ICD-10-CM | POA: Diagnosis not present

## 2018-04-07 DIAGNOSIS — K219 Gastro-esophageal reflux disease without esophagitis: Secondary | ICD-10-CM | POA: Diagnosis not present

## 2018-04-07 DIAGNOSIS — E7801 Familial hypercholesterolemia: Secondary | ICD-10-CM | POA: Diagnosis not present

## 2018-04-07 DIAGNOSIS — Z720 Tobacco use: Secondary | ICD-10-CM | POA: Diagnosis not present

## 2018-04-07 DIAGNOSIS — J4520 Mild intermittent asthma, uncomplicated: Secondary | ICD-10-CM | POA: Diagnosis not present

## 2018-04-07 DIAGNOSIS — I350 Nonrheumatic aortic (valve) stenosis: Secondary | ICD-10-CM | POA: Diagnosis not present

## 2018-04-07 NOTE — Progress Notes (Signed)
[place]) Care Management  04/07/2018  Brianna Harris 1960/09/20 [id number]   Phone call to patient to assess for community resources needs since her return home from her mother's home. There was no answer. This Education officer, museum will try to reach patient again within 1 week.    Sheralyn Boatman [facility] Care Management [phone]

## 2018-04-08 DIAGNOSIS — Z7901 Long term (current) use of anticoagulants: Secondary | ICD-10-CM | POA: Diagnosis not present

## 2018-04-09 ENCOUNTER — Ambulatory Visit: Payer: PPO | Admitting: Urology

## 2018-04-09 DIAGNOSIS — Z952 Presence of prosthetic heart valve: Secondary | ICD-10-CM | POA: Diagnosis not present

## 2018-04-09 DIAGNOSIS — I10 Essential (primary) hypertension: Secondary | ICD-10-CM | POA: Diagnosis not present

## 2018-04-09 DIAGNOSIS — Z48812 Encounter for surgical aftercare following surgery on the circulatory system: Secondary | ICD-10-CM | POA: Diagnosis not present

## 2018-04-09 DIAGNOSIS — J4540 Moderate persistent asthma, uncomplicated: Secondary | ICD-10-CM | POA: Diagnosis not present

## 2018-04-09 DIAGNOSIS — E119 Type 2 diabetes mellitus without complications: Secondary | ICD-10-CM | POA: Diagnosis not present

## 2018-04-09 DIAGNOSIS — F17210 Nicotine dependence, cigarettes, uncomplicated: Secondary | ICD-10-CM | POA: Diagnosis not present

## 2018-04-09 DIAGNOSIS — H9193 Unspecified hearing loss, bilateral: Secondary | ICD-10-CM | POA: Diagnosis not present

## 2018-04-09 DIAGNOSIS — I350 Nonrheumatic aortic (valve) stenosis: Secondary | ICD-10-CM | POA: Diagnosis not present

## 2018-04-14 DIAGNOSIS — Z7901 Long term (current) use of anticoagulants: Secondary | ICD-10-CM | POA: Diagnosis not present

## 2018-04-15 ENCOUNTER — Other Ambulatory Visit: Payer: Self-pay | Admitting: *Deleted

## 2018-04-15 NOTE — Patient Outreach (Signed)
[facility]) Care Management  04/15/2018  Brianna Harris September 26, 1960 [id number]  Phone call to patient to assess for community resource needs post discharge from the hospital and transition from her mother's home in [place]. This Education officer, museum explained that I have been in contact with her mother Brianna Harris and was following up to assess for care needs now that she is home. Per patient, she is doing well. " I had my  blood work done yesterday and everything was fine". Patient verbalized having no community resource needs. Per patient her roommate and mother assist with transportation to medical appointments and managing her medications. Per patient, all of her care needs have been met. This Education officer, museum will sign off at this time. This Education officer, museum provided patient with my contact information for any future issues or concerns.    Sheralyn Boatman [facility] Care Management [phone]

## 2018-04-24 ENCOUNTER — Encounter: Payer: Self-pay | Admitting: *Deleted

## 2018-04-24 ENCOUNTER — Other Ambulatory Visit: Payer: Self-pay | Admitting: *Deleted

## 2018-04-24 NOTE — Patient Outreach (Signed)
[place] [facility]) Care Management [place] Telephone Outreach  04/24/2018  Brianna Harris 1960-08-30 [id number]  10:10: Successfultelephone outreachto Brianna Harris, [name], 58 y/o femalereferred to [name] by [facility] telephonic RN CM on recent UM/ insurance referral. Noted through review of EMR that patient had recent hospitalization at [facility] February 22, 2018 for AVR; apparently, patient was re-hospitalized at [facility] November 15-18, 2019 for concerns around low INR values. Patient has history including, but not limited to, congenital deafness; DM- type II; HTN/ HLD; seizures; bipolar disorder; tobacco/ ETOH use-abuse; AV stenosis; asthma; GERD; depression; and previous CVA.  When patient's mother/ caregiver answered phone today, she immediately stated that she was busy and needed to call me back later today; unable to verify HIPAA with caregiver, and did encouraged her to return my call at her earliest convenience, which she agreed to do.   Plan: Will re-attempt contact with patient's caregiver within 4 business days if I do not hear back from her prior to then  Oneta Rack, RN, BSN, [employer] Coordinator [facility] Care Management  [phone]

## 2018-04-28 DIAGNOSIS — Z7901 Long term (current) use of anticoagulants: Secondary | ICD-10-CM | POA: Diagnosis not present

## 2018-04-30 ENCOUNTER — Encounter: Payer: Self-pay | Admitting: *Deleted

## 2018-04-30 ENCOUNTER — Other Ambulatory Visit: Payer: Self-pay | Admitting: *Deleted

## 2018-04-30 NOTE — Patient Outreach (Signed)
[facility]) Care Management [place] Telephone Outreach Unsuccessful consecutive outreach attempt # 2  04/30/2018  Brianna Harris February 15, 1961 [id number]  Unsuccessfultelephone outreachto Brianna Harris, [name], 58 y/o femalereferred to [place] by [facility] telephonic RN CM on recent UM/ insurance referral. Noted through review of EMR that patient had recent hospitalization at [facility] February 22, 2018 for AVR; apparently, patient was re-hospitalized at [facility] November 15-18, 2019 for concerns around low INR values. Patient has history including, but not limited to, congenital deafness; DM- type II; HTN/ HLD; seizures; bipolar disorder; tobacco/ ETOH use-abuse; AV stenosis; asthma; GERD; depression; and previous CVA.  Patient provided previous verbal request/ consent to speak with her mother about her health care needs.  Attempted phone calls x 2 to both numbers listed in EMR for patient's caregiver; with first attempt, phone rang without physical or voice mail pick up; with second attempt to alternate number, received automated outgoing voice message stating that the "caller you are trying to reach has a voice mail box that has not been set up."  Unable to leave HIPAA compliant voice message requesting call back on either number.  10:20 am:  Patient's caregiver returned my calls; HIPAA/ identity verified with caregiver today.  Today, patient's caregiver reports that "everything is going well;" and she denies issues/ concerns/ problems around patient's condition or overall plan of care.  Caregiver stated that patient has been attending all provider appointments and that she (caregiver) has continued to manage patient's medications for her, and completes filling of patient's pill box for all her medications for 2 weeks at a time.  Reports has not recently visited with PCP, as patient is closely followed by her  specialists care providers; caregiver reports will consider making PCP appointment for patient "at some point" and confirms that she has contact information for PCP.  Caregiver denies further care coordination needs, and we discussed that previously established [place] CM goals have been met; caregiver verbalizes agreement with case closure.  Caregiver denies further issues, concerns, or problems today.  I confirmed that she has my direct phone number, the main [facility] CM office phone number, and the [facility] CM 24-hour nurse advice phone number should issues arise  Plan:  Will close [place] CM case, as patient/ caregiver have met their previously established goals and verbalize no further care coordination needs, and will make patient's PCP aware of same.  [facility] CM Care Plan Problem One     Most Recent Value  Care Plan Problem One  Medication management of coumadin, as evidenced by patient caregiver reporting  Role Documenting the Problem One  Care Management [place] for Problem One  Not Active  THN Long Term Goal   Over the next 45 days, patient/ caregiver will verbalize ongoing plan for medication management of coumadin, as evidenced by patient/ caregiver reporting during [place] outreach   [place] Term Goal Start Date  03/28/18  [facility] Long Term Goal Met Date  04/30/18 [facility] met]  Interventions for Problem One Long Term Goal  Discussed with patient's caregiver patient's current clinical condition,  confirmed that patient and caregiver have an established plan in place for ongoing medication management for patient,  confirmed with caregiver that patient has no further care coordination needs  [facility] CM Short Term Goal #1   Over the next 30 days, patient will attend all provider appointments, as evidenced by patient/ caregiver reporting and collaboration  with care providers as indicated during [place] outreach  [facility] CM Short Term Goal #1 Start Date  03/28/18  [facility] CM Short Term  Goal #1 Met Date  04/30/18 [Goal Met]  Interventions for Short Term Goal #1  Confirmed with caregiver that patient has attended all recent provider appointments,  confirmed that caregiver denies further transportation needs for patient  THN CM Short Term Goal #2   Over the next 14 days, patient will schedule an appointment with her PCP for medication review and medication list update, as evidenced by patient/ caregiver reporting during [facility] RN CCM outreach [Goal re-established today]  [facility] CM Short Term Goal #2 Start Date  04/03/18   [facility] CM Short Term Goal #2 Met Date  04/30/18 [Goal not met]  Interventions for Short Term Goal #2  Confirmed with caregiver that patient continues following up with care providers,  confirmed with caregiver that patient has contact information for care providers and is considering making follow up appointment with PCP     It has been a pleasure caring for this patient,  Oneta Rack, RN, BSN, [place] Care Management  [phone]

## 2018-05-02 ENCOUNTER — Other Ambulatory Visit: Payer: Self-pay | Admitting: Internal Medicine

## 2018-05-02 DIAGNOSIS — Z1231 Encounter for screening mammogram for malignant neoplasm of breast: Secondary | ICD-10-CM

## 2018-05-12 DIAGNOSIS — Z7901 Long term (current) use of anticoagulants: Secondary | ICD-10-CM | POA: Diagnosis not present

## 2018-05-14 ENCOUNTER — Ambulatory Visit: Payer: PPO | Admitting: Urology

## 2018-05-27 DIAGNOSIS — Z7901 Long term (current) use of anticoagulants: Secondary | ICD-10-CM | POA: Diagnosis not present

## 2018-05-27 DIAGNOSIS — R001 Bradycardia, unspecified: Secondary | ICD-10-CM | POA: Diagnosis not present

## 2018-05-27 DIAGNOSIS — I350 Nonrheumatic aortic (valve) stenosis: Secondary | ICD-10-CM | POA: Diagnosis not present

## 2018-05-27 DIAGNOSIS — I10 Essential (primary) hypertension: Secondary | ICD-10-CM | POA: Diagnosis not present

## 2018-05-27 DIAGNOSIS — Z952 Presence of prosthetic heart valve: Secondary | ICD-10-CM | POA: Diagnosis not present

## 2018-05-27 DIAGNOSIS — E876 Hypokalemia: Secondary | ICD-10-CM | POA: Diagnosis not present

## 2018-05-27 DIAGNOSIS — R42 Dizziness and giddiness: Secondary | ICD-10-CM | POA: Diagnosis not present

## 2018-05-28 ENCOUNTER — Other Ambulatory Visit: Payer: Self-pay | Admitting: Cardiology

## 2018-05-28 DIAGNOSIS — R42 Dizziness and giddiness: Secondary | ICD-10-CM

## 2018-05-28 DIAGNOSIS — S0990XA Unspecified injury of head, initial encounter: Secondary | ICD-10-CM

## 2018-06-02 ENCOUNTER — Ambulatory Visit
Admission: RE | Admit: 2018-06-02 | Discharge: 2018-06-02 | Disposition: A | Discharge status: Home / Self Care | Payer: PPO | Source: Ambulatory Visit | Attending: Cardiology | Admitting: Cardiology

## 2018-06-02 DIAGNOSIS — R42 Dizziness and giddiness: Secondary | ICD-10-CM | POA: Insufficient documentation

## 2018-06-02 DIAGNOSIS — S0990XA Unspecified injury of head, initial encounter: Secondary | ICD-10-CM | POA: Insufficient documentation

## 2018-06-02 DIAGNOSIS — M79651 Pain in right thigh: Secondary | ICD-10-CM | POA: Diagnosis not present

## 2018-06-02 DIAGNOSIS — M5431 Sciatica, right side: Secondary | ICD-10-CM | POA: Diagnosis not present

## 2018-06-02 DIAGNOSIS — M47816 Spondylosis without myelopathy or radiculopathy, lumbar region: Secondary | ICD-10-CM | POA: Diagnosis not present

## 2018-06-09 DIAGNOSIS — Z950 Presence of cardiac pacemaker: Secondary | ICD-10-CM | POA: Insufficient documentation

## 2018-06-09 DIAGNOSIS — I442 Atrioventricular block, complete: Secondary | ICD-10-CM | POA: Insufficient documentation

## 2018-06-10 DIAGNOSIS — Z720 Tobacco use: Secondary | ICD-10-CM | POA: Diagnosis not present

## 2018-06-10 DIAGNOSIS — Z950 Presence of cardiac pacemaker: Secondary | ICD-10-CM | POA: Diagnosis not present

## 2018-06-10 DIAGNOSIS — Z45018 Encounter for adjustment and management of other part of cardiac pacemaker: Secondary | ICD-10-CM | POA: Diagnosis not present

## 2018-06-10 DIAGNOSIS — Z952 Presence of prosthetic heart valve: Secondary | ICD-10-CM | POA: Diagnosis not present

## 2018-06-10 DIAGNOSIS — I442 Atrioventricular block, complete: Secondary | ICD-10-CM | POA: Diagnosis not present

## 2018-06-10 DIAGNOSIS — Z87891 Personal history of nicotine dependence: Secondary | ICD-10-CM | POA: Diagnosis not present

## 2018-06-10 DIAGNOSIS — I9789 Other postprocedural complications and disorders of the circulatory system, not elsewhere classified: Secondary | ICD-10-CM | POA: Diagnosis not present

## 2018-06-10 DIAGNOSIS — R001 Bradycardia, unspecified: Secondary | ICD-10-CM | POA: Diagnosis not present

## 2018-06-10 NOTE — Progress Notes (Signed)
06/11/2018 3:40 PM   Brianna Harris February 16, 1961 [id number]  Referring provider: [name], MD [place], [place] [address]  Chief Complaint  Patient presents with  . Urinary Frequency    HPI: Brianna Harris is a 58 y.o. female Caucasian who, accompanied by her companion, presents today for an annual follow up for mixed incontinence, frequency and vaginal atrophy with the aid of an interpreter.  Background history Patient is a 58 year old Caucasian female who is referred to [place] by, Dr. Lisette Grinder for urinary frequency with her mother, Brianna Harris.  Patient states that she has had urinary frequency for two years ago.  She has not been evaluated by urology in the past.  She has not been tried on medications or underwent PT.  Patient has incontinence with SUI and urge.   Her incontinence volume is small.   She is wearing not pads/depends daily.   She is having urinary frequency x 8, urgency x 8, incontinence x 8, dysuria, nocturia x 4-7 and engages in toilet mapping.   She is not having associated dysuria or gross hematuria.  She does not have a history of urinary tract infections, STI's or injury to the bladder.  Her uncle had bladder cancer.  She endorses an occasional suprapubic pain, but she has constant back pain x several years.  She has not had any recent fevers, chills, nausea or vomiting.   She does not have a history of nephrolithiasis, GU surgery or GU trauma.  She is not sexually active.   She is post menopausal.   She admits to diarrhea. She is not having pain with bladder filling.  She has not had any recent imaging studies.  She is drinking  of water daily.   She is drinking two caffeinated beverages on Sundays.  She is drinking wine nightly.   She is drinking un sweet tea and diet [facility].  Her risk factors for incontinence are age, smoking, caffeine, diabetes, depression, vaginal atrophy and pelvic surgery.  She is taking diuretics and ACE inhibitors.   Her UA was unremarkable.    At her visit in 08/2016 she was started on Myrbetriq and vaginal estrogen cream.    At her visit on 12/06/2016, she had been experiencing urgency x ?,  frequency x 4-7 (improved), not restricting fluids to avoid visits to the restroom, is engaging in toilet mapping, incontinence x 0-3 (improved) and nocturia x 0-3 (improved).   Her PVR was 60 mL.   She has not been using the vaginal estrogen cream three nights weekly.  She ran out of Myrbetriq samples and does not remember if they help with her urinary issues or not.  She was restarted on Myrbetriq at that visit.    At her visit on 01/03/2017, she was experiencing urgency x 4-7,  frequency x 4-7 (stable), not restricting fluids to avoid visits to the restroom, is engaging in toilet mapping, incontinence x 0-3 (stable) and nocturia x 0-3 (stable).   Her PVR was 84 mL.   She has been using the vaginal estrogen cream three nights weekly.  Myrbetriq samples were effective.      On her 04/09/2017 visit, she was experiencing urgency x 0-3 (improved), frequency x 0-3 (improved), not restricting fluids to avoid visits to the restroom, was engaging in toilet mapping, incontinence x 0-3 (stable) and nocturia x 0-3 (stable). Her PVR was 94 mL.  Her BP was 124/82.  She denied dysuria, gross hematuria or suprapubic pain.  She had not had any recent fevers, chills, nausea or vomiting.   She had also begun PT.   She was not using the vaginal estrogen cream.  Her mother stated that she has been drinking two to three glasses of wine at night and not eating.  Today (06/11/2018), the patient is  experiencing urgency x 4-7 (worsened), frequency x 4-7 (worsened), is restricting fluids to avoid visits to the restroom 4-7, is engaging in toilet mapping, incontinence x 4-7 (worstened) and nocturia x 'a lot' (worsened).   Her BP is 135/75.   Her PVR is 81 mL.  The patient says she has been going to the bathroom a lot.  She has not been taking  Myrbetriq, as there were concerns that Myrbetriq might conflict with the medications she was given in 01/2018 after she had a pacemaker implanted.  Her companion says that they called to check if there were interactions.  Patient and her companion were reassured that it should not interfere with the medications that they reported she was on.  Checked with the patient what medications that she was on, and checked for interactions.  She will be having the blood tests to check her warfarin dosage on Monday.  Patient reports that she has stopped smoking, but still has cravings.  She has been eating a lot of sweets, according to her mother.  Patient's companion says she has been having trouble remembering her medications, and patient is no longer using the estrogen cream.  Patient says she drinks sweet tea but not water.  She says she has not tried drinking only water for 30 days, but may have been persuaded to try it by her companion.  Patient drinks 2-3 coffees a day.  Patient denies juice, energy drinks, alcohol.  She has been drinking Coke daily.  Past Medical History:  Diagnosis Date  . Asthma   . Deafness   . Depression   . Diabetes mellitus without complication ([place])   . GERD (gastroesophageal reflux disease)   . Heart abnormality    per mother pt has bad heart murmor- sees Dr Ubaldo Glassing   . Hyperlipidemia   . Hypertension     Surgical History: Past Surgical History:  Procedure Laterality Date  . ABDOMINAL HYSTERECTOMY  2000   per mother [facility] thought pt cancer and did hyster ,cervix and ovaries  . CHOLECYSTECTOMY    . COLONOSCOPY WITH PROPOFOL N/A 08/17/2016   Procedure: COLONOSCOPY WITH PROPOFOL;  Surgeon: Manya Silvas, MD;  Location[facility] END[facility]COPY;  Service: Endoscopy;  Laterality: N/A;  . ESOPHAGOGASTRODUODENOSCOPY (EGD) WITH PROPOFOL N/A 08/17/2016   Procedure: ESOPHAGOGASTRODUODENOSCOPY (EGD) WITH PROPOFOL;  Surgeon: Manya Silvas, MD;  Location: [facility] ENDOSCOPY;  Service: Endoscopy;   Laterality: N/A;  . PACEMAKER IMPLANT  01/2018    Home Medications:  Allergies as of 06/11/2018   No Known Allergies     Medication List       Accurate as of June 11, 2018  3:40 PM. Always use your most recent med list.        albuterol 108 (90 Base) MCG/ACT inhaler Commonly known as:  PROVENTIL HFA;VENTOLIN HFA Inhale into the lungs.   amLODipine 5 MG tablet Commonly known as:  NORVASC TAKE 1 TABLET BY MOUTH ONCE DAILY   ASPIRIN 81 PO 1 tab by mouth as needed   atorvastatin 40 MG tablet Commonly known as:  LIPITOR TAKE ONE TABLET BY MOUTH ONCE DAILY   BREO ELLIPTA 100-25 MCG/INH Aepb Generic drug:  fluticasone furoate-vilanterol  INHALE ONE PUFF BY MOUTH ONCE DAILY   chlorpheniramine-HYDROcodone 10-8 MG/5ML Suer Commonly known as:  TUSSIONEX Take by mouth.   conjugated estrogens vaginal cream Commonly known as:  PREMARIN Place 1 Applicatorful vaginally daily. Apply 0.5mg  (pea-sized amount)  just inside the vaginal introitus with a finger-tip every night for two weeks and then Monday, Wednesday and Friday nights.   hydrochlorothiazide 25 MG tablet Commonly known as:  HYDRODIURIL Take 25 mg by mouth daily.   lisinopril 20 MG tablet Commonly known as:  PRINIVIL,ZESTRIL Take 20 mg by mouth daily.   metFORMIN 500 MG 24 hr tablet Commonly known as:  GLUCOPHAGE-XR Take 500 mg by mouth 3 (three) times daily. Take 4 tabs (2,000 mg total) by mouth daily with dinner   mirabegron ER 25 MG Tb24 tablet Commonly known as:  MYRBETRIQ Take 1 tablet (25 mg total) by mouth daily.   omeprazole 20 MG capsule Commonly known as:  PRILOSEC Take 20 mg by mouth daily.   ONE TOUCH ULTRA TEST test strip Generic drug:  glucose blood       Allergies: No Known Allergies  Family History: Family History  Problem Relation Age of Onset  . Diabetes Maternal Uncle   . Heart attack Father   . Kidney cancer Paternal Uncle   . Breast cancer Neg Hx   . Bladder Cancer Neg Hx       Social History:  reports that she has been smoking cigarettes. She has a 22.50 pack-year smoking history. She has never used smokeless tobacco. She reports current alcohol use of about 5.0 standard drinks of alcohol per week. She reports that she does not use drugs.  ROS: UROLOGY Frequent Urination?: No Hard to postpone urination?: No Burning/pain with urination?: No Get up at night to urinate?: Yes Leakage of urine?: No Urine stream starts and stops?: No Trouble starting stream?: No Do you have to strain to urinate?: No Blood in urine?: No Urinary tract infection?: No Sexually transmitted disease?: No Injury to kidneys or bladder?: No Painful intercourse?: No Weak stream?: No Currently pregnant?: No Vaginal bleeding?: No Last menstrual period?: n  Gastrointestinal Nausea?: No Vomiting?: No Indigestion/heartburn?: No Diarrhea?: No Constipation?: No  Constitutional Fever: No Night sweats?: No Weight loss?: No Fatigue?: No  Skin Skin rash/lesions?: No Itching?: No  Eyes Blurred vision?: No Double vision?: No  Ears/Nose/Throat Sore throat?: No Sinus problems?: No  Hematologic/Lymphatic Swollen glands?: No Easy bruising?: No  Cardiovascular Leg swelling?: No Chest pain?: No  Respiratory Cough?: No Shortness of breath?: No  Endocrine Excessive thirst?: No  Musculoskeletal Back pain?: No Joint pain?: No  Neurological Headaches?: No Dizziness?: Yes  Psychologic Depression?: No Anxiety?: No  Physical Exam: BP 135/75   Pulse 88   Ht 5\' 3"  (1.6 m)   Wt 137 lb (62.1 kg)   BMI 24.27 kg/m   Constitutional: Well nourished. Alert and oriented, No acute distress. Cardiovascular: No clubbing, cyanosis, or edema. Respiratory: Normal respiratory effort, no increased work of breathing. Skin: No rashes, bruises or suspicious lesions. Neurologic: Grossly intact, no focal deficits, moving all 4 extremities. Psychiatric: Normal mood and affect.    Laboratory Data: Lab Results  Component Value Date   WBC 12.0 (H) 11/29/2017   HGB 12.4 11/29/2017   HCT 36.8 11/29/2017   MCV 86.8 11/29/2017   PLT 398 11/29/2017   I have reviewed the labs.  Pertinent Imaging:  Results for orders placed or performed in visit on 06/11/18  Bladder Scan (Post Void Residual) in  office  Result Value Ref Range   SCA Interp 81    I have independently reviewed the films.  Assessment & Plan:    1. Frequency - She had found Myrbetriq effective in the past, but will need to confirm with cardiologist before she can resume due to possible interactions-cardiologist approved the medication  - Patient has been given Myrbetriq samples today - RTC in three weeks for OAB questionnaire and PVR  2. Vaginal atrophy - She does not want to use the vaginal estrogen cream  3. Stress urinary incontince - See above  4. Heavy alcohol abuse - Patient is not currently drinking alcohol  Return in about 3 weeks (around 07/02/2018) for PVR and OAB questionnaire.  Zara Council, PA-C  [facility] [address] [place], [place] [address] [phone]  I, Adele Schilder, am acting as a Education administrator for [employer], PA-C.   I have reviewed the above documentation for accuracy and completeness, and I agree with the above.    Zara Council, PA-C

## 2018-06-11 ENCOUNTER — Ambulatory Visit: Payer: PPO | Admitting: Urology

## 2018-06-11 ENCOUNTER — Encounter: Payer: Self-pay | Admitting: Urology

## 2018-06-11 VITALS — BP 135/75 | HR 88 | Ht 63.0 in | Wt 137.0 lb

## 2018-06-11 DIAGNOSIS — R350 Frequency of micturition: Secondary | ICD-10-CM | POA: Diagnosis not present

## 2018-06-11 LAB — BLADDER SCAN AMB NON-IMAGING: SCA Interp: 81

## 2018-06-11 MED ORDER — MIRABEGRON ER 25 MG PO TB24: 25.0000 mg | ORAL_TABLET | Freq: Every day | ORAL | 0 refills | Status: DC

## 2018-06-13 ENCOUNTER — Ambulatory Visit
Admission: RE | Admit: 2018-06-13 | Discharge: 2018-06-13 | Disposition: A | Discharge status: Home / Self Care | Payer: PPO | Source: Ambulatory Visit | Attending: Internal Medicine | Admitting: Internal Medicine

## 2018-06-13 DIAGNOSIS — Z1231 Encounter for screening mammogram for malignant neoplasm of breast: Secondary | ICD-10-CM | POA: Insufficient documentation

## 2018-06-16 DIAGNOSIS — R42 Dizziness and giddiness: Secondary | ICD-10-CM | POA: Diagnosis not present

## 2018-06-16 DIAGNOSIS — I6523 Occlusion and stenosis of bilateral carotid arteries: Secondary | ICD-10-CM | POA: Diagnosis not present

## 2018-06-16 DIAGNOSIS — Z7901 Long term (current) use of anticoagulants: Secondary | ICD-10-CM | POA: Diagnosis not present

## 2018-06-17 ENCOUNTER — Telehealth: Payer: Self-pay | Admitting: Urology

## 2018-06-17 NOTE — Telephone Encounter (Signed)
Pt mother called back asking for Rx for samples given to her daughter, the samples/medication is helping. Mother doesn't know what the samples were as she isn't with her daughter whom is deaf, she is making the call/request for her daughter, states medication is documented in her daughter's visit notes. Please send Rx to [facility] in [place].

## 2018-06-17 NOTE — Telephone Encounter (Signed)
Pt's Mom LMOM and requests a rx called in for samples that was given at last appt. She did not leave medication name on VM and I tried to call her back but no answer. She requests it be sent to [facility] in [place]

## 2018-06-18 MED ORDER — MIRABEGRON ER 25 MG PO TB24: 25.0000 mg | ORAL_TABLET | Freq: Every day | ORAL | 11 refills | Status: DC

## 2018-06-18 NOTE — Telephone Encounter (Signed)
Returned call to [facility]. Rx sent to [name] per DPR  advised.

## 2018-06-18 NOTE — Telephone Encounter (Signed)
Called patient to verify that she has spoken with cardiologist about Myrbetriq as directed by Larene [place] at last visit before Rx is sent to pharmacy. Left message on mother, Mildreds, voicemail and called and spoke to friend, [name], who will relay message to patient. [name] stated it would be best to speak with patients mother, [name] as she is the one organizing and managing patients medications. Will try mother again at a later time.

## 2018-06-18 NOTE — Telephone Encounter (Signed)
Patient's caregiver called back wanting to know if we have called int he medication yet? She stated that it was our responsibility to contact Dr. Ubaldo Glassing to confirm if she can take the medication or not. She wants a call back to discuss. I told her it may not be today due to our heavy patient volume in clinic but someone would address this and call back.   Sharyn Lull

## 2018-06-19 ENCOUNTER — Telehealth: Payer: Self-pay | Admitting: Urology

## 2018-06-19 MED ORDER — MIRABEGRON ER 25 MG PO TB24: 25.0000 mg | ORAL_TABLET | Freq: Every day | ORAL | 11 refills | Status: DC

## 2018-06-19 NOTE — Telephone Encounter (Signed)
Returned call to [facility]. Rx sent to [facility] per request.

## 2018-06-19 NOTE — Telephone Encounter (Signed)
[name] called and left a voicemail stating that she called Dr. Bethanne Ginger office and was told that it was ok for Brianna Harris to take the medication that you wanted to give her.  Don't we need that in writing? She wants a call back   [place]

## 2018-06-23 DIAGNOSIS — E119 Type 2 diabetes mellitus without complications: Secondary | ICD-10-CM | POA: Diagnosis not present

## 2018-06-27 DIAGNOSIS — I350 Nonrheumatic aortic (valve) stenosis: Secondary | ICD-10-CM | POA: Diagnosis not present

## 2018-06-27 DIAGNOSIS — I10 Essential (primary) hypertension: Secondary | ICD-10-CM | POA: Diagnosis not present

## 2018-07-04 DIAGNOSIS — E119 Type 2 diabetes mellitus without complications: Secondary | ICD-10-CM | POA: Diagnosis not present

## 2018-07-04 DIAGNOSIS — Z7901 Long term (current) use of anticoagulants: Secondary | ICD-10-CM | POA: Diagnosis not present

## 2018-07-07 DIAGNOSIS — Z7901 Long term (current) use of anticoagulants: Secondary | ICD-10-CM | POA: Diagnosis not present

## 2018-07-08 NOTE — Progress Notes (Incomplete)
06/11/2018 9:23 AM   Brianna Harris December 14, 1960 [id number]  Referring provider: [name], MD [place], [place] [address]  No chief complaint on file.   HPI: Brianna Harris is a 58 y.o. female Caucasian who, accompanied by her companion, presents today for a 3 week follow up for mixed incontinence, frequency and vaginal atrophy with the aid of an interpreter.  Background history Patient is a 58 year old Caucasian female who is referred to [place] by, Dr. Lisette Grinder for urinary frequency with her mother, [name].  Patient states that she has had urinary frequency for two years ago.  She has not been evaluated by urology in the past.  She has not been tried on medications or underwent PT.  Patient has incontinence with SUI and urge.   Her incontinence volume is small.   She is wearing not pads/depends daily.   She is having urinary frequency x 8, urgency x 8, incontinence x 8, dysuria, nocturia x 4-7 and engages in toilet mapping.   She is not having associated dysuria or gross hematuria.  She does not have a history of urinary tract infections, STI's or injury to the bladder.  Her uncle had bladder cancer.  She endorses an occasional suprapubic pain, but she has constant back pain x several years.  She has not had any recent fevers, chills, nausea or vomiting.   She does not have a history of nephrolithiasis, GU surgery or GU trauma.  She is not sexually active.   She is post menopausal.   She admits to diarrhea. She is not having pain with bladder filling.  She has not had any recent imaging studies.  She is drinking  of water daily.   She is drinking two caffeinated beverages on Sundays.  She is drinking wine nightly.   She is drinking un sweet tea and diet [facility].  Her risk factors for incontinence are age, smoking, caffeine, diabetes, depression, vaginal atrophy and pelvic surgery.  She is taking diuretics and ACE inhibitors.  Her UA was unremarkable.    At her  visit in 08/2016 she was started on Myrbetriq and vaginal estrogen cream.    At her visit on 12/06/2016, she had been experiencing urgency x ?,  frequency x 4-7 (improved), not restricting fluids to avoid visits to the restroom, is engaging in toilet mapping, incontinence x 0-3 (improved) and nocturia x 0-3 (improved).   Her PVR was 60 mL.   She has not been using the vaginal estrogen cream three nights weekly.  She ran out of Myrbetriq samples and does not remember if they help with her urinary issues or not.  She was restarted on Myrbetriq at that visit.    At her visit on 01/03/2017, she was experiencing urgency x 4-7,  frequency x 4-7 (stable), not restricting fluids to avoid visits to the restroom, is engaging in toilet mapping, incontinence x 0-3 (stable) and nocturia x 0-3 (stable).   Her PVR was 84 mL.   She has been using the vaginal estrogen cream three nights weekly.  Myrbetriq samples were effective.      On her 04/09/2017 visit, she was experiencing urgency x 0-3 (improved), frequency x 0-3 (improved), not restricting fluids to avoid visits to the restroom, was engaging in toilet mapping, incontinence x 0-3 (stable) and nocturia x 0-3 (stable). Her PVR was 94 mL.  Her BP was 124/82.  She denied dysuria, gross hematuria or suprapubic pain.  She had not had  any recent fevers, chills, nausea or vomiting.   She had also begun PT.   She was not using the vaginal estrogen cream.  Her mother stated that she has been drinking two to three glasses of wine at night and not eating.  On her 06/11/2018 visit, the patient was experiencing urgency x 4-7 (worsened), frequency x 4-7 (worsened), was restricting fluids to avoid visits to the restroom 4-7, was engaging in toilet mapping, incontinence x 4-7 (worstened) and nocturia x 'a lot' (worsened).  Her BP was 135/75.  Her PVR was 81 mL.  The patient said she had been going to the bathroom a lot.  She had not been taking Myrbetriq, as there were concerns that  Myrbetriq might conflict with the medications she was given in 01/2018 after she had a pacemaker implanted.  Her companion said that they called to check if there were interactions.  Patient and her companion were reassured that it should not interfere with the medications that they reported she was on.  Checked with the patient what medications that she was on, and checked for interactions.  She was scheduled to have the blood tests to check her warfarin dosage on 06/16/2018.  Patient reported that she had stopped smoking, but still has cravings.  She has been eating a lot of sweets, according to her mother.  Patient's companion said she has been having trouble remembering her medications, and patient is no longer using the estrogen cream.  Patient said she drinks sweet tea but not water.  She said she has not tried drinking only water for 30 days, but may have been persuaded to try it by her companion.  Patient drinks 2-3 coffees a day.  Patient denied juice, energy drinks, alcohol.  She had been drinking Coke daily.  Today (07/10/2018), the patient is experiencing urgency x *** (***), frequency x *** (***), {not}{is} restricting fluids to avoid visits to the restroom ***, {not}{is} engaging in toilet mapping, incontinence x *** (***) and nocturia x *** (***).  Her BP is ***.  Her PVR is ***.    ***  Past Medical History:  Diagnosis Date   Asthma    Deafness    Depression    Diabetes mellitus without complication (HCC)    GERD (gastroesophageal reflux disease)    Heart abnormality    per mother pt has bad heart murmor- sees Dr Ubaldo Glassing    Hyperlipidemia    Hypertension     Surgical History: Past Surgical History:  Procedure Laterality Date   ABDOMINAL HYSTERECTOMY  2000   per mother [facility] thought pt cancer and did hyster ,cervix and ovaries   CHOLECYSTECTOMY     COLONOSCOPY WITH PROPOFOL N/A 08/17/2016   Procedure: COLONOSCOPY WITH PROPOFOL;  Surgeon: Manya Silvas, MD;  Location:  [facility];  Service: Endoscopy;  Laterality: N/A;   ESOPHAGOGASTRODUODENOSCOPY (EGD) WITH PROPOFOL N/A 08/17/2016   Procedure: ESOPHAGOGASTRODUODENOSCOPY (EGD) WITH PROPOFOL;  Surgeon: Manya Silvas, MD;  Location: [facility] ENDOSCOPY;  Service: Endoscopy;  Laterality: N/A;   PACEMAKER IMPLANT  01/2018    Home Medications:  Allergies as of 07/10/2018   No Known Allergies     Medication List       Accurate as of July 08, 2018  9:23 AM. Always use your most recent med list.        albuterol 108 (90 Base) MCG/ACT inhaler Commonly known as:  PROVENTIL HFA;VENTOLIN HFA Inhale into the lungs.   amLODipine 5 MG tablet Commonly known as:  NORVASC TAKE 1 TABLET BY MOUTH ONCE DAILY   ASPIRIN 81 PO 1 tab by mouth as needed   atorvastatin 40 MG tablet Commonly known as:  LIPITOR TAKE ONE TABLET BY MOUTH ONCE DAILY   Breo Ellipta 100-25 MCG/INH Aepb Generic drug:  fluticasone furoate-vilanterol INHALE ONE PUFF BY MOUTH ONCE DAILY   chlorpheniramine-HYDROcodone 10-8 MG/5ML Suer Commonly known as:  TUSSIONEX Take by mouth.   conjugated estrogens vaginal cream Commonly known as:  Premarin Place 1 Applicatorful vaginally daily. Apply 0.5mg  (pea-sized amount)  just inside the vaginal introitus with a finger-tip every night for two weeks and then Monday, Wednesday and Friday nights.   hydrochlorothiazide 25 MG tablet Commonly known as:  HYDRODIURIL Take 25 mg by mouth daily.   lisinopril 20 MG tablet Commonly known as:  PRINIVIL,ZESTRIL Take 20 mg by mouth daily.   metFORMIN 500 MG 24 hr tablet Commonly known as:  GLUCOPHAGE-XR Take 500 mg by mouth 3 (three) times daily. Take 4 tabs (2,000 mg total) by mouth daily with dinner   mirabegron ER 25 MG Tb24 tablet Commonly known as:  MYRBETRIQ Take 1 tablet (25 mg total) by mouth daily.   omeprazole 20 MG capsule Commonly known as:  PRILOSEC Take 20 mg by mouth daily.   ONE TOUCH ULTRA TEST test strip Generic drug:   glucose blood       Allergies: No Known Allergies  Family History: Family History  Problem Relation Age of Onset   Diabetes Maternal Uncle    Heart attack Father    Kidney cancer Paternal Uncle    Breast cancer Neg Hx    Bladder Cancer Neg Hx     Social History:  reports that she has been smoking cigarettes. She has a 22.50 pack-year smoking history. She has never used smokeless tobacco. She reports current alcohol use of about 5.0 standard drinks of alcohol per week. She reports that she does not use drugs.  ROS:                                        Physical Exam: There were no vitals taken for this visit.  Constitutional: Well nourished. Alert and oriented, No acute distress. {HEENT: [place] AT, moist mucus membranes.  Trachea midline, no masses.} Cardiovascular: No clubbing, cyanosis, or edema. Respiratory: Normal respiratory effort, no increased work of breathing. {GI: Abdomen is soft, non tender, non distended, no abdominal masses. Liver and spleen not palpable.  No hernias appreciated.  Stool sample for occult testing is not indicated.} {GU: No CVA tenderness.  No bladder fullness or masses.  *** external genitalia, *** pubic hair distribution, no lesions.  Normal urethral meatus, no lesions, no prolapse, no discharge.   No urethral masses, tenderness and/or tenderness. No bladder fullness, tenderness or masses. *** vagina mucosa, *** estrogen effect, no discharge, no lesions, *** pelvic support, *** cystocele and *** rectocele noted.  No cervical motion tenderness.  Uterus is freely mobile and non-fixed.  No adnexal/parametria masses or tenderness noted.  Anus and perineum are without rashes or lesions.   *** } Skin: No rashes, bruises or suspicious lesions. {Lymph: No cervical or inguinal adenopathy.} Neurologic: Grossly intact, no focal deficits, moving all 4 extremities. Psychiatric: Normal mood and affect.   Laboratory Data: Lab Results    Component Value Date   WBC 12.0 (H) 11/29/2017   HGB 12.4 11/29/2017   HCT 36.8 11/29/2017  MCV 86.8 11/29/2017   PLT 398 11/29/2017   I have reviewed the labs.  Pertinent Imaging:  Results for orders placed or performed in visit on 06/11/18  Bladder Scan (Post Void Residual) in office  Result Value Ref Range   SCA Interp 81    I have independently reviewed the films.  Assessment & Plan:    1. Frequency *** - She had found Myrbetriq effective in the past, but will need to confirm with cardiologist before she can resume due to possible interactions-cardiologist approved the medication  *** - Patient has been given Myrbetriq samples today *** - RTC in three weeks for OAB questionnaire and PVR  2. Vaginal atrophy *** - She does not want to use the vaginal estrogen cream  3. Stress urinary incontince *** - See above  4. Heavy alcohol abuse *** - Patient is not currently drinking alcohol  No follow-ups on file.  Zara Council, PA-C  [facility] [address], [place], [place] [address] [phone]  I, Adele Schilder, am acting as a Education administrator for [employer], PA-C.   {Add Scribe Attestation Statement}

## 2018-07-09 DIAGNOSIS — I10 Essential (primary) hypertension: Secondary | ICD-10-CM | POA: Diagnosis not present

## 2018-07-09 DIAGNOSIS — J4520 Mild intermittent asthma, uncomplicated: Secondary | ICD-10-CM | POA: Diagnosis not present

## 2018-07-09 DIAGNOSIS — Z8669 Personal history of other diseases of the nervous system and sense organs: Secondary | ICD-10-CM | POA: Diagnosis not present

## 2018-07-09 DIAGNOSIS — E119 Type 2 diabetes mellitus without complications: Secondary | ICD-10-CM | POA: Diagnosis not present

## 2018-07-09 DIAGNOSIS — E7801 Familial hypercholesterolemia: Secondary | ICD-10-CM | POA: Diagnosis not present

## 2018-07-09 DIAGNOSIS — Z0000 Encounter for general adult medical examination without abnormal findings: Secondary | ICD-10-CM | POA: Diagnosis not present

## 2018-07-09 DIAGNOSIS — Z952 Presence of prosthetic heart valve: Secondary | ICD-10-CM | POA: Diagnosis not present

## 2018-07-09 DIAGNOSIS — Z720 Tobacco use: Secondary | ICD-10-CM | POA: Diagnosis not present

## 2018-07-09 DIAGNOSIS — F329 Major depressive disorder, single episode, unspecified: Secondary | ICD-10-CM | POA: Diagnosis not present

## 2018-07-10 ENCOUNTER — Ambulatory Visit: Payer: PPO | Admitting: Urology

## 2018-08-07 DIAGNOSIS — Z952 Presence of prosthetic heart valve: Secondary | ICD-10-CM | POA: Diagnosis not present

## 2018-08-15 ENCOUNTER — Ambulatory Visit: Payer: PPO | Admitting: Urology

## 2018-08-17 NOTE — Progress Notes (Deleted)
06/11/2018 9:31 PM   Brianna Harris 21-Jan-1961 [id number]  Referring provider: [name], MD [place], [place] [address]  No chief complaint on file.   HPI: Brianna Harris is a 58 y.o. Caucasian female with mixed incontinence, frequency and vaginal atrophy who presents today for a follow up after a trial of Myrbetriq.    Mixed incontinence Risk factors:  Infrequent water intake, large amounts of diet Mountain Dew, Coke, coffee, unsweet tea and history of cocaine and alcohol abuse.    The patient is  experiencing urgency x *** (***), frequency x *** (***), not/is restricting fluids to avoid visits to the restroom ***, not/is engaging in toilet mapping, incontinence x *** (***) and nocturia x *** (***).   Her BP is ***.   Her PVR is ***.     Frequency See above  Vaginal atrophy She has not been applying the cream.    Today (06/11/2018), the patient is  experiencing urgency x 4-7 (worsened), frequency x 4-7 (worsened), is restricting fluids to avoid visits to the restroom 4-7, is engaging in toilet mapping, incontinence x 4-7 (worstened) and nocturia x 'a lot' (worsened).   Her BP is 135/75.   Her PVR is 81 mL.    Past Medical History:  Diagnosis Date  . Asthma   . Deafness   . Depression   . Diabetes mellitus without complication ([place])   . GERD (gastroesophageal reflux disease)   . Heart abnormality    per mother pt has bad heart murmor- sees Dr Ubaldo Glassing   . Hyperlipidemia   . Hypertension     Surgical History: Past Surgical History:  Procedure Laterality Date  . ABDOMINAL HYSTERECTOMY  2000   per mother [facility] thought pt cancer and did hyster ,cervix and ovaries  . CHOLECYSTECTOMY    . COLONOSCOPY WITH PROPOFOL N/A 08/17/2016   Procedure: COLONOSCOPY WITH PROPOFOL;  Surgeon: Manya Silvas, MD;  Location: [facility] ENDOSCOPY;  Service: Endoscopy;  Laterality: N/A;  . ESOPHAGOGASTRODUODENOSCOPY (EGD) WITH PROPOFOL N/A 08/17/2016   Procedure:  ESOPHAGOGASTRODUODENOSCOPY (EGD) WITH PROPOFOL;  Surgeon: Manya Silvas, MD;  Location[facility] ENDOSCOPY;  Service: Endoscopy;  Laterality: N/A;  . PACEMAKER IMPLANT  01/2018    Home Medications:  Allergies as of 08/18/2018   No Known Allergies     Medication List       Accurate as of August 17, 2018  9:31 PM. Always use your most recent med list.        albuterol 108 (90 Base) MCG/ACT inhaler Commonly known as:  VENTOLIN HFA Inhale into the lungs.   amLODipine 5 MG tablet Commonly known as:  NORVASC TAKE 1 TABLET BY MOUTH ONCE DAILY   ASPIRIN 81 PO 1 tab by mouth as needed   atorvastatin 40 MG tablet Commonly known as:  LIPITOR TAKE ONE TABLET BY MOUTH ONCE DAILY   Breo Ellipta 100-25 MCG/INH Aepb Generic drug:  fluticasone furoate-vilanterol INHALE ONE PUFF BY MOUTH ONCE DAILY   chlorpheniramine-HYDROcodone 10-8 MG/5ML Suer Commonly known as:  TUSSIONEX Take by mouth.   conjugated estrogens vaginal cream Commonly known as:  Premarin Place 1 Applicatorful vaginally daily. Apply 0.5mg  (pea-sized amount)  just inside the vaginal introitus with a finger-tip every night for two weeks and then Monday, Wednesday and Friday nights.   hydrochlorothiazide 25 MG tablet Commonly known as:  HYDRODIURIL Take 25 mg by mouth daily.   lisinopril 20 MG tablet Commonly known as:  ZESTRIL Take 20 mg  by mouth daily.   metFORMIN 500 MG 24 hr tablet Commonly known as:  GLUCOPHAGE-XR Take 500 mg by mouth 3 (three) times daily. Take 4 tabs (2,000 mg total) by mouth daily with dinner   mirabegron ER 25 MG Tb24 tablet Commonly known as:  MYRBETRIQ Take 1 tablet (25 mg total) by mouth daily.   omeprazole 20 MG capsule Commonly known as:  PRILOSEC Take 20 mg by mouth daily.   ONE TOUCH ULTRA TEST test strip Generic drug:  glucose blood       Allergies: No Known Allergies  Family History: Family History  Problem Relation Age of Onset  . Diabetes Maternal Uncle   . Heart  attack Father   . Kidney cancer Paternal Uncle   . Breast cancer Neg Hx   . Bladder Cancer Neg Hx     Social History:  reports that she has been smoking cigarettes. She has a 22.50 pack-year smoking history. She has never used smokeless tobacco. She reports current alcohol use of about 5.0 standard drinks of alcohol per week. She reports that she does not use drugs.  ROS:                                        Physical Exam: There were no vitals taken for this visit.  Constitutional: Well nourished. Alert and oriented, No acute distress. Cardiovascular: No clubbing, cyanosis, or edema. Respiratory: Normal respiratory effort, no increased work of breathing. Skin: No rashes, bruises or suspicious lesions. Neurologic: Grossly intact, no focal deficits, moving all 4 extremities. Psychiatric: Normal mood and affect.   Laboratory Data: Lab Results  Component Value Date   WBC 12.0 (H) 11/29/2017   HGB 12.4 11/29/2017   HCT 36.8 11/29/2017   MCV 86.8 11/29/2017   PLT 398 11/29/2017   I have reviewed the labs.  Pertinent Imaging:  Results for orders placed or performed in visit on 06/11/18  Bladder Scan (Post Void Residual) in office  Result Value Ref Range   SCA Interp 81    I have independently reviewed the films.  Assessment & Plan:    1. Frequency - She had found Myrbetriq effective in the past, but will need to confirm with cardiologist before she can resume due to possible interactions-cardiologist approved the medication  - Patient has been given Myrbetriq samples today - RTC in three weeks for OAB questionnaire and PVR  2. Vaginal atrophy - She does not want to use the vaginal estrogen cream  3. Stress urinary incontince - See above  4. Heavy alcohol abuse - Patient is not currently drinking alcohol  No follow-ups on file.  Zara Council, PA-C  [facility] [address], [place], [place]  [address] [phone]

## 2018-08-18 ENCOUNTER — Ambulatory Visit: Payer: PPO | Admitting: Urology

## 2018-08-18 DIAGNOSIS — Z7901 Long term (current) use of anticoagulants: Secondary | ICD-10-CM | POA: Diagnosis not present

## 2018-08-25 DIAGNOSIS — Z45018 Encounter for adjustment and management of other part of cardiac pacemaker: Secondary | ICD-10-CM | POA: Diagnosis not present

## 2018-08-25 DIAGNOSIS — Z950 Presence of cardiac pacemaker: Secondary | ICD-10-CM | POA: Diagnosis not present

## 2018-08-25 DIAGNOSIS — Z7901 Long term (current) use of anticoagulants: Secondary | ICD-10-CM | POA: Diagnosis not present

## 2018-09-24 DIAGNOSIS — E119 Type 2 diabetes mellitus without complications: Secondary | ICD-10-CM | POA: Diagnosis not present

## 2018-09-26 DIAGNOSIS — I350 Nonrheumatic aortic (valve) stenosis: Secondary | ICD-10-CM | POA: Diagnosis not present

## 2018-09-26 DIAGNOSIS — I10 Essential (primary) hypertension: Secondary | ICD-10-CM | POA: Diagnosis not present

## 2018-10-02 DIAGNOSIS — Z952 Presence of prosthetic heart valve: Secondary | ICD-10-CM | POA: Diagnosis not present

## 2018-10-02 DIAGNOSIS — E119 Type 2 diabetes mellitus without complications: Secondary | ICD-10-CM | POA: Diagnosis not present

## 2018-10-02 DIAGNOSIS — Z79899 Other long term (current) drug therapy: Secondary | ICD-10-CM | POA: Diagnosis not present

## 2018-10-09 DIAGNOSIS — Z952 Presence of prosthetic heart valve: Secondary | ICD-10-CM | POA: Diagnosis not present

## 2018-10-09 DIAGNOSIS — E119 Type 2 diabetes mellitus without complications: Secondary | ICD-10-CM | POA: Diagnosis not present

## 2018-10-09 DIAGNOSIS — G2581 Restless legs syndrome: Secondary | ICD-10-CM | POA: Diagnosis not present

## 2018-10-09 DIAGNOSIS — I10 Essential (primary) hypertension: Secondary | ICD-10-CM | POA: Diagnosis not present

## 2018-10-09 DIAGNOSIS — J4520 Mild intermittent asthma, uncomplicated: Secondary | ICD-10-CM | POA: Diagnosis not present

## 2018-10-09 DIAGNOSIS — Z720 Tobacco use: Secondary | ICD-10-CM | POA: Diagnosis not present

## 2018-10-09 DIAGNOSIS — H9193 Unspecified hearing loss, bilateral: Secondary | ICD-10-CM | POA: Diagnosis not present

## 2018-10-09 DIAGNOSIS — F329 Major depressive disorder, single episode, unspecified: Secondary | ICD-10-CM | POA: Diagnosis not present

## 2018-10-16 DIAGNOSIS — E119 Type 2 diabetes mellitus without complications: Secondary | ICD-10-CM | POA: Diagnosis not present

## 2018-10-29 DIAGNOSIS — Z952 Presence of prosthetic heart valve: Secondary | ICD-10-CM | POA: Diagnosis not present

## 2018-11-27 DIAGNOSIS — Z03818 Encounter for observation for suspected exposure to other biological agents ruled out: Secondary | ICD-10-CM | POA: Diagnosis not present

## 2018-12-03 DIAGNOSIS — Z952 Presence of prosthetic heart valve: Secondary | ICD-10-CM | POA: Diagnosis not present

## 2018-12-05 DIAGNOSIS — Z45018 Encounter for adjustment and management of other part of cardiac pacemaker: Secondary | ICD-10-CM | POA: Diagnosis not present

## 2018-12-05 DIAGNOSIS — Z950 Presence of cardiac pacemaker: Secondary | ICD-10-CM | POA: Diagnosis not present

## 2018-12-17 DIAGNOSIS — E119 Type 2 diabetes mellitus without complications: Secondary | ICD-10-CM | POA: Diagnosis not present

## 2018-12-17 DIAGNOSIS — Z79899 Other long term (current) drug therapy: Secondary | ICD-10-CM | POA: Diagnosis not present

## 2018-12-23 DIAGNOSIS — R2689 Other abnormalities of gait and mobility: Secondary | ICD-10-CM | POA: Diagnosis not present

## 2018-12-23 DIAGNOSIS — Z8673 Personal history of transient ischemic attack (TIA), and cerebral infarction without residual deficits: Secondary | ICD-10-CM | POA: Diagnosis not present

## 2018-12-23 DIAGNOSIS — R42 Dizziness and giddiness: Secondary | ICD-10-CM | POA: Diagnosis not present

## 2018-12-23 DIAGNOSIS — Z79899 Other long term (current) drug therapy: Secondary | ICD-10-CM | POA: Diagnosis not present

## 2018-12-24 ENCOUNTER — Other Ambulatory Visit: Payer: Self-pay | Admitting: Neurology

## 2018-12-24 DIAGNOSIS — R2689 Other abnormalities of gait and mobility: Secondary | ICD-10-CM

## 2019-01-01 ENCOUNTER — Ambulatory Visit: Payer: PPO

## 2019-01-01 DIAGNOSIS — J4540 Moderate persistent asthma, uncomplicated: Secondary | ICD-10-CM | POA: Diagnosis not present

## 2019-01-01 DIAGNOSIS — Z720 Tobacco use: Secondary | ICD-10-CM | POA: Diagnosis not present

## 2019-01-01 DIAGNOSIS — I350 Nonrheumatic aortic (valve) stenosis: Secondary | ICD-10-CM | POA: Diagnosis not present

## 2019-01-01 DIAGNOSIS — I442 Atrioventricular block, complete: Secondary | ICD-10-CM | POA: Diagnosis not present

## 2019-01-01 DIAGNOSIS — Z952 Presence of prosthetic heart valve: Secondary | ICD-10-CM | POA: Diagnosis not present

## 2019-01-01 DIAGNOSIS — Z45018 Encounter for adjustment and management of other part of cardiac pacemaker: Secondary | ICD-10-CM | POA: Diagnosis not present

## 2019-01-01 DIAGNOSIS — E119 Type 2 diabetes mellitus without complications: Secondary | ICD-10-CM | POA: Diagnosis not present

## 2019-01-01 DIAGNOSIS — I10 Essential (primary) hypertension: Secondary | ICD-10-CM | POA: Diagnosis not present

## 2019-01-01 DIAGNOSIS — E7801 Familial hypercholesterolemia: Secondary | ICD-10-CM | POA: Diagnosis not present

## 2019-01-01 DIAGNOSIS — K219 Gastro-esophageal reflux disease without esophagitis: Secondary | ICD-10-CM | POA: Diagnosis not present

## 2019-01-01 DIAGNOSIS — H9193 Unspecified hearing loss, bilateral: Secondary | ICD-10-CM | POA: Diagnosis not present

## 2019-01-01 DIAGNOSIS — I9789 Other postprocedural complications and disorders of the circulatory system, not elsewhere classified: Secondary | ICD-10-CM | POA: Diagnosis not present

## 2019-01-06 ENCOUNTER — Other Ambulatory Visit: Payer: Self-pay

## 2019-01-06 ENCOUNTER — Ambulatory Visit
Admission: RE | Admit: 2019-01-06 | Discharge: 2019-01-06 | Disposition: A | Discharge status: Home / Self Care | Payer: PPO | Source: Ambulatory Visit | Attending: Neurology | Admitting: Neurology

## 2019-01-06 DIAGNOSIS — R2689 Other abnormalities of gait and mobility: Secondary | ICD-10-CM | POA: Insufficient documentation

## 2019-01-06 DIAGNOSIS — R42 Dizziness and giddiness: Secondary | ICD-10-CM | POA: Diagnosis not present

## 2019-01-08 DIAGNOSIS — Z952 Presence of prosthetic heart valve: Secondary | ICD-10-CM | POA: Diagnosis not present

## 2019-01-19 DIAGNOSIS — E119 Type 2 diabetes mellitus without complications: Secondary | ICD-10-CM | POA: Diagnosis not present

## 2019-02-10 DIAGNOSIS — J40 Bronchitis, not specified as acute or chronic: Secondary | ICD-10-CM | POA: Diagnosis not present

## 2019-02-10 DIAGNOSIS — R05 Cough: Secondary | ICD-10-CM | POA: Diagnosis not present

## 2019-02-12 ENCOUNTER — Ambulatory Visit: Payer: PPO

## 2019-02-13 DIAGNOSIS — Z952 Presence of prosthetic heart valve: Secondary | ICD-10-CM | POA: Diagnosis not present

## 2019-03-18 ENCOUNTER — Telehealth: Payer: Self-pay | Admitting: *Deleted

## 2019-03-18 NOTE — Telephone Encounter (Signed)
Received referral for low dose lung cancer screening CT scan. Message left at phone number listed in EMR for patient to call me back to facilitate scheduling scan.  

## 2019-03-23 ENCOUNTER — Telehealth: Payer: Self-pay | Admitting: *Deleted

## 2019-03-23 ENCOUNTER — Encounter: Payer: Self-pay | Admitting: *Deleted

## 2019-03-23 DIAGNOSIS — Z87891 Personal history of nicotine dependence: Secondary | ICD-10-CM

## 2019-03-23 DIAGNOSIS — Z122 Encounter for screening for malignant neoplasm of respiratory organs: Secondary | ICD-10-CM

## 2019-03-23 DIAGNOSIS — Z952 Presence of prosthetic heart valve: Secondary | ICD-10-CM | POA: Diagnosis not present

## 2019-03-23 NOTE — Telephone Encounter (Signed)
Received referral for initial lung cancer screening scan. Contacted patient and obtained smoking history,(former, quit 02/13/18, 58.5 pack year) as well as answering questions related to screening process. Patient denies signs of lung cancer such as weight loss or hemoptysis. Patient denies comorbidity that would prevent curative treatment if lung cancer were found. Patient is scheduled for shared decision making visit and CT scan on 03/27/19.

## 2019-03-24 DIAGNOSIS — I700 Atherosclerosis of aorta: Secondary | ICD-10-CM | POA: Insufficient documentation

## 2019-03-24 DIAGNOSIS — M25511 Pain in right shoulder: Secondary | ICD-10-CM | POA: Diagnosis not present

## 2019-03-24 DIAGNOSIS — Z8673 Personal history of transient ischemic attack (TIA), and cerebral infarction without residual deficits: Secondary | ICD-10-CM | POA: Diagnosis not present

## 2019-03-24 DIAGNOSIS — R42 Dizziness and giddiness: Secondary | ICD-10-CM | POA: Diagnosis not present

## 2019-03-27 ENCOUNTER — Ambulatory Visit
Admission: RE | Admit: 2019-03-27 | Discharge: 2019-03-27 | Disposition: A | Discharge status: Home / Self Care | Payer: PPO | Source: Ambulatory Visit | Attending: Oncology | Admitting: Oncology

## 2019-03-27 ENCOUNTER — Ambulatory Visit: Payer: PPO | Admitting: Oncology

## 2019-03-27 ENCOUNTER — Other Ambulatory Visit: Payer: Self-pay

## 2019-03-27 ENCOUNTER — Inpatient Hospital Stay: Payer: PPO | Admitting: Oncology

## 2019-03-27 ENCOUNTER — Inpatient Hospital Stay: Payer: PPO | Attending: Oncology | Admitting: Oncology

## 2019-03-27 DIAGNOSIS — Z87891 Personal history of nicotine dependence: Secondary | ICD-10-CM | POA: Insufficient documentation

## 2019-03-27 DIAGNOSIS — Z122 Encounter for screening for malignant neoplasm of respiratory organs: Secondary | ICD-10-CM

## 2019-03-27 NOTE — Progress Notes (Signed)
Virtual Visit via Video Note  I connected with Brianna Harris on 03/27/19 at  9:15 AM EST by a video enabled telemedicine application and verified that I am speaking with the correct person using two identifiers.  Location: Patient: Home Provider: Office   I discussed the limitations of evaluation and management by telemedicine and the availability of in person appointments. The patient expressed understanding and agreed to proceed.  I discussed the assessment and treatment plan with the patient. The patient was provided an opportunity to ask questions and all were answered. The patient agreed with the plan and demonstrated an understanding of the instructions.   The patient was advised to call back or seek an in-person evaluation if the symptoms worsen or if the condition fails to improve as anticipated.   In accordance with CMS guidelines, patient has met eligibility criteria including age, absence of signs or symptoms of lung cancer.  Social History   Tobacco Use  . Smoking status: Former Smoker    Packs/day: 1.50    Years: 39.00    Pack years: 58.50    Types: Cigarettes    Quit date: 02/13/2018    Years since quitting: 1.1  . Smokeless tobacco: Never Used  Substance Use Topics  . Alcohol use: Yes    Alcohol/week: 5.0 standard drinks    Types: 5 Glasses of wine per week  . Drug use: No    Types: Marijuana    Comment: smoked 3 yr ago,for 2 y. Hx of cocaine use       A shared decision-making session was conducted prior to the performance of CT scan. This includes one or more decision aids, includes benefits and harms of screening, follow-up diagnostic testing, over-diagnosis, false positive rate, and total radiation exposure.   Counseling on the importance of adherence to annual lung cancer LDCT screening, impact of co-morbidities, and ability or willingness to undergo diagnosis and treatment is imperative for compliance of the program.   Counseling on the importance of continued  smoking cessation for former smokers; the importance of smoking cessation for current smokers, and information about tobacco cessation interventions have been given to patient including [name] [facility]uit [facility] and 1800 quit [name] programs.   Written order for lung cancer screening with LDCT has been given to the patient and any and all questions have been answered to the best of my abilities.    Yearly follow up will be coordinated by Shawn Perkins, Thoracic Navigator.  I provided 15 minutes of face-to-face video visit time during this encounter, and > 50% was spent counseling as documented under my assessment & plan.   Jennifer E Burns, NP   

## 2019-03-31 ENCOUNTER — Encounter: Payer: Self-pay | Admitting: *Deleted

## 2019-04-01 ENCOUNTER — Ambulatory Visit: Payer: PPO | Admitting: Physical Therapy

## 2019-04-06 DIAGNOSIS — I350 Nonrheumatic aortic (valve) stenosis: Secondary | ICD-10-CM | POA: Diagnosis not present

## 2019-04-06 DIAGNOSIS — R001 Bradycardia, unspecified: Secondary | ICD-10-CM | POA: Diagnosis not present

## 2019-04-06 DIAGNOSIS — E119 Type 2 diabetes mellitus without complications: Secondary | ICD-10-CM | POA: Diagnosis not present

## 2019-04-06 DIAGNOSIS — I10 Essential (primary) hypertension: Secondary | ICD-10-CM | POA: Diagnosis not present

## 2019-04-06 DIAGNOSIS — H9193 Unspecified hearing loss, bilateral: Secondary | ICD-10-CM | POA: Diagnosis not present

## 2019-04-06 DIAGNOSIS — I442 Atrioventricular block, complete: Secondary | ICD-10-CM | POA: Diagnosis not present

## 2019-04-06 DIAGNOSIS — J4520 Mild intermittent asthma, uncomplicated: Secondary | ICD-10-CM | POA: Diagnosis not present

## 2019-04-06 DIAGNOSIS — Z952 Presence of prosthetic heart valve: Secondary | ICD-10-CM | POA: Diagnosis not present

## 2019-04-06 DIAGNOSIS — E7801 Familial hypercholesterolemia: Secondary | ICD-10-CM | POA: Diagnosis not present

## 2019-04-06 DIAGNOSIS — I9789 Other postprocedural complications and disorders of the circulatory system, not elsewhere classified: Secondary | ICD-10-CM | POA: Diagnosis not present

## 2019-04-13 ENCOUNTER — Ambulatory Visit: Payer: PPO | Admitting: Physical Therapy

## 2019-04-15 ENCOUNTER — Ambulatory Visit: Payer: PPO | Admitting: Physical Therapy

## 2019-04-21 ENCOUNTER — Ambulatory Visit: Payer: PPO | Admitting: Physical Therapy

## 2019-04-22 DIAGNOSIS — Z952 Presence of prosthetic heart valve: Secondary | ICD-10-CM | POA: Diagnosis not present

## 2019-04-23 ENCOUNTER — Ambulatory Visit: Payer: PPO | Admitting: Physical Therapy

## 2019-04-27 ENCOUNTER — Ambulatory Visit: Payer: PPO | Admitting: Physical Therapy

## 2019-04-29 ENCOUNTER — Ambulatory Visit: Payer: PPO | Admitting: Physical Therapy

## 2019-05-04 DIAGNOSIS — E119 Type 2 diabetes mellitus without complications: Secondary | ICD-10-CM | POA: Diagnosis not present

## 2019-05-04 DIAGNOSIS — Z8669 Personal history of other diseases of the nervous system and sense organs: Secondary | ICD-10-CM | POA: Diagnosis not present

## 2019-05-04 DIAGNOSIS — Z952 Presence of prosthetic heart valve: Secondary | ICD-10-CM | POA: Diagnosis not present

## 2019-05-04 DIAGNOSIS — E7801 Familial hypercholesterolemia: Secondary | ICD-10-CM | POA: Diagnosis not present

## 2019-05-04 DIAGNOSIS — I10 Essential (primary) hypertension: Secondary | ICD-10-CM | POA: Diagnosis not present

## 2019-05-04 DIAGNOSIS — H9193 Unspecified hearing loss, bilateral: Secondary | ICD-10-CM | POA: Diagnosis not present

## 2019-05-04 DIAGNOSIS — J4520 Mild intermittent asthma, uncomplicated: Secondary | ICD-10-CM | POA: Diagnosis not present

## 2019-05-04 DIAGNOSIS — Z0000 Encounter for general adult medical examination without abnormal findings: Secondary | ICD-10-CM | POA: Diagnosis not present

## 2019-05-05 ENCOUNTER — Ambulatory Visit: Payer: PPO | Admitting: Physical Therapy

## 2019-05-07 ENCOUNTER — Ambulatory Visit: Payer: PPO | Admitting: Physical Therapy

## 2019-05-09 DIAGNOSIS — Z03818 Encounter for observation for suspected exposure to other biological agents ruled out: Secondary | ICD-10-CM | POA: Diagnosis not present

## 2019-05-25 DIAGNOSIS — Z952 Presence of prosthetic heart valve: Secondary | ICD-10-CM | POA: Diagnosis not present

## 2019-06-01 DIAGNOSIS — Z952 Presence of prosthetic heart valve: Secondary | ICD-10-CM | POA: Diagnosis not present

## 2019-06-04 ENCOUNTER — Other Ambulatory Visit: Payer: Self-pay | Admitting: Internal Medicine

## 2019-06-04 DIAGNOSIS — Z1231 Encounter for screening mammogram for malignant neoplasm of breast: Secondary | ICD-10-CM

## 2019-06-13 DIAGNOSIS — Z20822 Contact with and (suspected) exposure to covid-19: Secondary | ICD-10-CM | POA: Diagnosis not present

## 2019-06-13 DIAGNOSIS — Z20828 Contact with and (suspected) exposure to other viral communicable diseases: Secondary | ICD-10-CM | POA: Diagnosis not present

## 2019-06-15 DIAGNOSIS — E119 Type 2 diabetes mellitus without complications: Secondary | ICD-10-CM | POA: Diagnosis not present

## 2019-06-24 DIAGNOSIS — S99912A Unspecified injury of left ankle, initial encounter: Secondary | ICD-10-CM | POA: Diagnosis not present

## 2019-06-24 DIAGNOSIS — W06XXXA Fall from bed, initial encounter: Secondary | ICD-10-CM | POA: Diagnosis not present

## 2019-06-24 DIAGNOSIS — S8262XA Displaced fracture of lateral malleolus of left fibula, initial encounter for closed fracture: Secondary | ICD-10-CM | POA: Diagnosis not present

## 2019-06-29 DIAGNOSIS — E119 Type 2 diabetes mellitus without complications: Secondary | ICD-10-CM | POA: Diagnosis not present

## 2019-06-29 DIAGNOSIS — Z952 Presence of prosthetic heart valve: Secondary | ICD-10-CM | POA: Diagnosis not present

## 2019-06-30 DIAGNOSIS — I350 Nonrheumatic aortic (valve) stenosis: Secondary | ICD-10-CM | POA: Diagnosis not present

## 2019-06-30 DIAGNOSIS — I10 Essential (primary) hypertension: Secondary | ICD-10-CM | POA: Diagnosis not present

## 2019-06-30 DIAGNOSIS — E119 Type 2 diabetes mellitus without complications: Secondary | ICD-10-CM | POA: Diagnosis not present

## 2019-06-30 DIAGNOSIS — I442 Atrioventricular block, complete: Secondary | ICD-10-CM | POA: Diagnosis not present

## 2019-06-30 DIAGNOSIS — Z952 Presence of prosthetic heart valve: Secondary | ICD-10-CM | POA: Diagnosis not present

## 2019-06-30 DIAGNOSIS — R001 Bradycardia, unspecified: Secondary | ICD-10-CM | POA: Diagnosis not present

## 2019-06-30 DIAGNOSIS — I9789 Other postprocedural complications and disorders of the circulatory system, not elsewhere classified: Secondary | ICD-10-CM | POA: Diagnosis not present

## 2019-07-09 ENCOUNTER — Ambulatory Visit
Admission: RE | Admit: 2019-07-09 | Discharge: 2019-07-09 | Disposition: A | Discharge status: Home / Self Care | Payer: PPO | Source: Ambulatory Visit | Attending: Internal Medicine | Admitting: Internal Medicine

## 2019-07-09 DIAGNOSIS — Z1231 Encounter for screening mammogram for malignant neoplasm of breast: Secondary | ICD-10-CM | POA: Insufficient documentation

## 2019-07-13 DIAGNOSIS — R197 Diarrhea, unspecified: Secondary | ICD-10-CM | POA: Diagnosis not present

## 2019-07-22 DIAGNOSIS — R001 Bradycardia, unspecified: Secondary | ICD-10-CM | POA: Diagnosis not present

## 2019-07-27 DIAGNOSIS — Z952 Presence of prosthetic heart valve: Secondary | ICD-10-CM | POA: Diagnosis not present

## 2019-08-03 DIAGNOSIS — Z952 Presence of prosthetic heart valve: Secondary | ICD-10-CM | POA: Diagnosis not present

## 2019-08-10 DIAGNOSIS — Z952 Presence of prosthetic heart valve: Secondary | ICD-10-CM | POA: Diagnosis not present

## 2019-08-10 DIAGNOSIS — I10 Essential (primary) hypertension: Secondary | ICD-10-CM | POA: Diagnosis not present

## 2019-08-10 DIAGNOSIS — H9193 Unspecified hearing loss, bilateral: Secondary | ICD-10-CM | POA: Diagnosis not present

## 2019-08-10 DIAGNOSIS — Z8669 Personal history of other diseases of the nervous system and sense organs: Secondary | ICD-10-CM | POA: Diagnosis not present

## 2019-08-10 DIAGNOSIS — E119 Type 2 diabetes mellitus without complications: Secondary | ICD-10-CM | POA: Diagnosis not present

## 2019-08-10 DIAGNOSIS — J4520 Mild intermittent asthma, uncomplicated: Secondary | ICD-10-CM | POA: Diagnosis not present

## 2019-08-10 DIAGNOSIS — F329 Major depressive disorder, single episode, unspecified: Secondary | ICD-10-CM | POA: Diagnosis not present

## 2019-08-10 DIAGNOSIS — E7801 Familial hypercholesterolemia: Secondary | ICD-10-CM | POA: Diagnosis not present

## 2019-08-19 DIAGNOSIS — Z7901 Long term (current) use of anticoagulants: Secondary | ICD-10-CM | POA: Diagnosis not present

## 2019-08-25 DIAGNOSIS — Z7901 Long term (current) use of anticoagulants: Secondary | ICD-10-CM | POA: Diagnosis not present

## 2019-08-31 DIAGNOSIS — Z7901 Long term (current) use of anticoagulants: Secondary | ICD-10-CM | POA: Diagnosis not present

## 2019-09-07 DIAGNOSIS — Z7901 Long term (current) use of anticoagulants: Secondary | ICD-10-CM | POA: Diagnosis not present

## 2019-10-02 DIAGNOSIS — E119 Type 2 diabetes mellitus without complications: Secondary | ICD-10-CM | POA: Diagnosis not present

## 2019-10-07 DIAGNOSIS — I350 Nonrheumatic aortic (valve) stenosis: Secondary | ICD-10-CM | POA: Diagnosis not present

## 2019-10-07 DIAGNOSIS — R001 Bradycardia, unspecified: Secondary | ICD-10-CM | POA: Diagnosis not present

## 2019-10-07 DIAGNOSIS — Z7901 Long term (current) use of anticoagulants: Secondary | ICD-10-CM | POA: Diagnosis not present

## 2019-10-07 DIAGNOSIS — I10 Essential (primary) hypertension: Secondary | ICD-10-CM | POA: Diagnosis not present

## 2019-10-29 DIAGNOSIS — H2512 Age-related nuclear cataract, left eye: Secondary | ICD-10-CM | POA: Diagnosis not present

## 2019-11-03 DIAGNOSIS — E119 Type 2 diabetes mellitus without complications: Secondary | ICD-10-CM | POA: Diagnosis not present

## 2019-11-03 DIAGNOSIS — Z7901 Long term (current) use of anticoagulants: Secondary | ICD-10-CM | POA: Diagnosis not present

## 2019-11-10 DIAGNOSIS — Z0001 Encounter for general adult medical examination with abnormal findings: Secondary | ICD-10-CM | POA: Diagnosis not present

## 2019-11-10 DIAGNOSIS — Z7901 Long term (current) use of anticoagulants: Secondary | ICD-10-CM | POA: Diagnosis not present

## 2019-11-10 DIAGNOSIS — Z952 Presence of prosthetic heart valve: Secondary | ICD-10-CM | POA: Diagnosis not present

## 2019-11-10 DIAGNOSIS — I10 Essential (primary) hypertension: Secondary | ICD-10-CM | POA: Diagnosis not present

## 2019-11-10 DIAGNOSIS — H9193 Unspecified hearing loss, bilateral: Secondary | ICD-10-CM | POA: Diagnosis not present

## 2019-11-10 DIAGNOSIS — Z950 Presence of cardiac pacemaker: Secondary | ICD-10-CM | POA: Diagnosis not present

## 2019-11-10 DIAGNOSIS — E119 Type 2 diabetes mellitus without complications: Secondary | ICD-10-CM | POA: Diagnosis not present

## 2019-11-10 DIAGNOSIS — J4540 Moderate persistent asthma, uncomplicated: Secondary | ICD-10-CM | POA: Diagnosis not present

## 2019-11-10 DIAGNOSIS — F329 Major depressive disorder, single episode, unspecified: Secondary | ICD-10-CM | POA: Diagnosis not present

## 2019-11-23 DIAGNOSIS — Z7901 Long term (current) use of anticoagulants: Secondary | ICD-10-CM | POA: Diagnosis not present

## 2019-12-10 DIAGNOSIS — D72829 Elevated white blood cell count, unspecified: Secondary | ICD-10-CM | POA: Diagnosis not present

## 2019-12-10 DIAGNOSIS — Z03818 Encounter for observation for suspected exposure to other biological agents ruled out: Secondary | ICD-10-CM | POA: Diagnosis not present

## 2019-12-10 DIAGNOSIS — R112 Nausea with vomiting, unspecified: Secondary | ICD-10-CM | POA: Diagnosis not present

## 2019-12-11 DIAGNOSIS — K760 Fatty (change of) liver, not elsewhere classified: Secondary | ICD-10-CM | POA: Insufficient documentation

## 2019-12-14 ENCOUNTER — Telehealth: Payer: Self-pay | Admitting: Oncology

## 2019-12-14 NOTE — Telephone Encounter (Signed)
Referral was received from [facility]; however, patient was already scheduled for 12-17-19. MD stated that this was ok and did not need to be moved sooner. Writer phoned [facility] on this date and informed of appt date.

## 2019-12-15 DIAGNOSIS — R112 Nausea with vomiting, unspecified: Secondary | ICD-10-CM | POA: Diagnosis not present

## 2019-12-15 DIAGNOSIS — K760 Fatty (change of) liver, not elsewhere classified: Secondary | ICD-10-CM | POA: Diagnosis not present

## 2019-12-15 DIAGNOSIS — E119 Type 2 diabetes mellitus without complications: Secondary | ICD-10-CM | POA: Diagnosis not present

## 2019-12-15 DIAGNOSIS — I350 Nonrheumatic aortic (valve) stenosis: Secondary | ICD-10-CM | POA: Diagnosis not present

## 2019-12-15 DIAGNOSIS — D72829 Elevated white blood cell count, unspecified: Secondary | ICD-10-CM | POA: Diagnosis not present

## 2019-12-17 ENCOUNTER — Inpatient Hospital Stay: Payer: PPO

## 2019-12-17 ENCOUNTER — Other Ambulatory Visit: Payer: Self-pay

## 2019-12-17 ENCOUNTER — Encounter: Payer: Self-pay | Admitting: Oncology

## 2019-12-17 ENCOUNTER — Inpatient Hospital Stay: Payer: PPO | Attending: Oncology | Admitting: Oncology

## 2019-12-17 VITALS — BP 106/66 | HR 84 | Resp 16 | Wt 142.7 lb

## 2019-12-17 DIAGNOSIS — R110 Nausea: Secondary | ICD-10-CM | POA: Insufficient documentation

## 2019-12-17 DIAGNOSIS — E119 Type 2 diabetes mellitus without complications: Secondary | ICD-10-CM | POA: Insufficient documentation

## 2019-12-17 DIAGNOSIS — D72829 Elevated white blood cell count, unspecified: Secondary | ICD-10-CM | POA: Diagnosis not present

## 2019-12-17 DIAGNOSIS — D509 Iron deficiency anemia, unspecified: Secondary | ICD-10-CM | POA: Diagnosis not present

## 2019-12-17 DIAGNOSIS — Z8051 Family history of malignant neoplasm of kidney: Secondary | ICD-10-CM | POA: Diagnosis not present

## 2019-12-17 DIAGNOSIS — Z9049 Acquired absence of other specified parts of digestive tract: Secondary | ICD-10-CM | POA: Diagnosis not present

## 2019-12-17 DIAGNOSIS — Z7901 Long term (current) use of anticoagulants: Secondary | ICD-10-CM | POA: Insufficient documentation

## 2019-12-17 DIAGNOSIS — Z833 Family history of diabetes mellitus: Secondary | ICD-10-CM | POA: Diagnosis not present

## 2019-12-17 DIAGNOSIS — Z79899 Other long term (current) drug therapy: Secondary | ICD-10-CM | POA: Diagnosis not present

## 2019-12-17 DIAGNOSIS — Z87891 Personal history of nicotine dependence: Secondary | ICD-10-CM | POA: Diagnosis not present

## 2019-12-17 DIAGNOSIS — Z8249 Family history of ischemic heart disease and other diseases of the circulatory system: Secondary | ICD-10-CM | POA: Diagnosis not present

## 2019-12-17 DIAGNOSIS — R5383 Other fatigue: Secondary | ICD-10-CM | POA: Diagnosis not present

## 2019-12-17 LAB — CBC WITH DIFFERENTIAL/PLATELET
Abs Immature Granulocytes: 0.1 10*3/uL — ABNORMAL HIGH (ref 0.00–0.07)
Basophils Absolute: 0.1 10*3/uL (ref 0.0–0.1)
Basophils Relative: 1 %
Eosinophils Absolute: 1.3 10*3/uL — ABNORMAL HIGH (ref 0.0–0.5)
Eosinophils Relative: 9 %
HCT: 34.9 % — ABNORMAL LOW (ref 36.0–46.0)
Hemoglobin: 10.1 g/dL — ABNORMAL LOW (ref 12.0–15.0)
Immature Granulocytes: 1 %
Lymphocytes Relative: 29 %
Lymphs Abs: 4.2 10*3/uL — ABNORMAL HIGH (ref 0.7–4.0)
MCH: 20.1 pg — ABNORMAL LOW (ref 26.0–34.0)
MCHC: 28.9 g/dL — ABNORMAL LOW (ref 30.0–36.0)
MCV: 69.4 fL — ABNORMAL LOW (ref 80.0–100.0)
Monocytes Absolute: 0.8 10*3/uL (ref 0.1–1.0)
Monocytes Relative: 5 %
Neutro Abs: 8 10*3/uL — ABNORMAL HIGH (ref 1.7–7.7)
Neutrophils Relative %: 55 %
Platelets: 506 10*3/uL — ABNORMAL HIGH (ref 150–400)
RBC: 5.03 MIL/uL (ref 3.87–5.11)
RDW: 18.1 % — ABNORMAL HIGH (ref 11.5–15.5)
WBC: 14.4 10*3/uL — ABNORMAL HIGH (ref 4.0–10.5)
nRBC: 0 % (ref 0.0–0.2)

## 2019-12-17 LAB — VITAMIN B12: Vitamin B-12: 552 pg/mL (ref 180–914)

## 2019-12-17 LAB — RETICULOCYTES
Immature Retic Fract: 19.7 % — ABNORMAL HIGH (ref 2.3–15.9)
RBC.: 4.92 MIL/uL (ref 3.87–5.11)
Retic Count, Absolute: 83.6 10*3/uL (ref 19.0–186.0)
Retic Ct Pct: 1.7 % (ref 0.4–3.1)

## 2019-12-17 LAB — COMPREHENSIVE METABOLIC PANEL
ALT: 35 U/L (ref 0–44)
AST: 42 U/L — ABNORMAL HIGH (ref 15–41)
Albumin: 4.1 g/dL (ref 3.5–5.0)
Alkaline Phosphatase: 118 U/L (ref 38–126)
Anion gap: 10 (ref 5–15)
BUN: 15 mg/dL (ref 6–20)
CO2: 27 mmol/L (ref 22–32)
Calcium: 9.3 mg/dL (ref 8.9–10.3)
Chloride: 101 mmol/L (ref 98–111)
Creatinine, Ser: 1.01 mg/dL — ABNORMAL HIGH (ref 0.44–1.00)
GFR calc Af Amer: >60 mL/min (ref >60)
GFR calc non Af Amer: >60 mL/min (ref >60)
Glucose, Bld: 112 mg/dL — ABNORMAL HIGH (ref 70–99)
Potassium: 4.4 mmol/L (ref 3.5–5.1)
Sodium: 138 mmol/L (ref 135–145)
Total Bilirubin: 0.5 mg/dL (ref 0.3–1.2)
Total Protein: 7.7 g/dL (ref 6.5–8.1)

## 2019-12-17 LAB — IRON AND TIBC
Iron: 16 ug/dL — ABNORMAL LOW (ref 28–170)
Saturation Ratios: 3 % — ABNORMAL LOW (ref 10.4–31.8)
TIBC: 496 ug/dL — ABNORMAL HIGH (ref 250–450)
UIBC: 480 ug/dL

## 2019-12-17 LAB — FOLATE: Folate: 15.2 ng/mL (ref >5.9)

## 2019-12-17 LAB — TSH: TSH: 3.237 u[IU]/mL (ref 0.350–4.500)

## 2019-12-17 LAB — FERRITIN: Ferritin: 6 ng/mL — ABNORMAL LOW (ref 11–307)

## 2019-12-17 NOTE — Progress Notes (Signed)
Patient here for oncology follow-up appointment, expresses concerns of N/V/D at this time.  Video interpreter used ID [id number], mother also assisted with conversation.

## 2019-12-17 NOTE — Progress Notes (Signed)
Hematology/Oncology Consult note [facility]  [phone] Fax:([phone]  Patient Care Team: [name], MD as PCP - General (Internal Medicine) Ubaldo Glassing Javier Docker, MD as Consulting Physician (Cardiology)   Name of the patient: Brianna Harris  [id number]  Dec 26, 1960   Date of visit: 12/17/19  Diagnosis-history of chronic leukocytosis/neutrophilia likely reactive  Chief complaint/ Reason for visit-patient last seen in 2019 for leukocytosis now referred for same issue  Heme/Onc history: Patient is a 59 year old female last seen by me in August 2019 for leukocytosis.Blood work from 07/19/16 showed cbc with wbc of 14.5 with neutrophilia and lymphocytosis. Bcr abl testing was negative for cml.Peripheral flow cytometry showed no significant immunophenotypic abnormality. Absolute increases in both the anti-lymphocyte subsets are detected. No monoclonal B-cell population detected. Results support a reactive from the neoplastic process.  At that time patient was asked to follow-up with her PCP.  She has now been rereferred for the same problem.  Most recent CBC From 12/15/2019 showed white count of 12.9, H&H of 9.6/34.9 with an MCV of 71.8.  Of note patient was not anemic when I had seen her back in 2019 but since then she has been anemic with a hemoglobin that has remained around 9 with persistent microcytosis.  Of note patient also underwent heart valve surgery and is currently on Coumadin which is being managed by cardiology.  Patient needs a sign language interpreter   Interval history-patient is here with her mother today and history obtained with the help of a sign language interpreter.  Per patient and her mother she has not been doing well over the last 2 to 3 weeks.She was started on a new medication for her diabetes and because of that she was experiencing some nausea and vomiting when the dose was increased.  Dr. Edwina Barth cut back on the dose of  the medicine again.  She also reports poor appetite and early satiety.  Reports occasional nausea and abdominal bloating.  She has gained weight over the last 2 years and is up to 142 pounds from 129 pounds.  Patient is concerned that she has some liver issue although her recent CMP was normal.  ECOG PS- 1 Pain scale- 0   Review of systems- Review of Systems  Constitutional: Positive for malaise/fatigue. Negative for chills, fever and weight loss.  HENT: Negative for congestion, ear discharge and nosebleeds.   Eyes: Negative for blurred vision.  Respiratory: Negative for cough, hemoptysis, sputum production, shortness of breath and wheezing.   Cardiovascular: Negative for chest pain, palpitations, orthopnea and claudication.  Gastrointestinal: Positive for nausea. Negative for abdominal pain, blood in stool, constipation, diarrhea, heartburn, melena and vomiting.       Abdominal bloating and early satiety  Genitourinary: Negative for dysuria, flank pain, frequency, hematuria and urgency.  Musculoskeletal: Negative for back pain, joint pain and myalgias.  Skin: Negative for rash.  Neurological: Negative for dizziness, tingling, focal weakness, seizures, weakness and headaches.  Endo/Heme/Allergies: Does not bruise/bleed easily.  Psychiatric/Behavioral: Negative for depression and suicidal ideas. The patient does not have insomnia.        No Known Allergies   Past Medical History:  Diagnosis Date  . Asthma   . Deafness   . Depression   . Diabetes mellitus without complication ([place])   . GERD (gastroesophageal reflux disease)   . Heart abnormality    per mother pt has bad heart murmor- sees Dr Ubaldo Glassing   . Hyperlipidemia   . Hypertension  Past Surgical History:  Procedure Laterality Date  . ABDOMINAL HYSTERECTOMY  2000   per mother [facility] thought pt cancer and did hyster ,cervix and ovaries  . CHOLECYSTECTOMY    . COLONOSCOPY WITH PROPOFOL N/A 08/17/2016   Procedure:  COLONOSCOPY WITH PROPOFOL;  Surgeon: Manya Silvas, MD;  Location: [facility] ENDOSCOPY;  Service: Endoscopy;  Laterality: N/A;  . ESOPHAGOGASTRODUODENOSCOPY (EGD) WITH PROPOFOL N/A 08/17/2016   Procedure: ESOPHAGOGASTRODUODENOSCOPY (EGD) WITH PROPOFOL;  Surgeon: Manya Silvas, MD;  Location: [facility] ENDOSCOPY;  Service: Endoscopy;  Laterality: N/A;  . PACEMAKER IMPLANT  01/2018    Social History   Socioeconomic History  . Marital status: Single    Spouse name: Not on file  . Number of children: Not on file  . Years of education: Not on file  . Highest education level: Not on file  Occupational History  . Not on file  Tobacco Use  . Smoking status: Former Smoker    Packs/day: 1.50    Years: 39.00    Pack years: 58.50    Types: Cigarettes    Quit date: 02/13/2018    Years since quitting: 1.8  . Smokeless tobacco: Never Used  Substance and Sexual Activity  . Alcohol use: Yes    Alcohol/week: 5.0 standard drinks    Types: 5 Glasses of wine per week  . Drug use: No    Types: Marijuana    Comment: smoked 3 yr ago,for 2 y. Hx of cocaine use   . Sexual activity: Never  Other Topics Concern  . Not on file  Social History Narrative  . Not on file   Social Determinants of Health   Financial Resource Strain:   . Difficulty of Paying Living Expenses: Not on file  Food Insecurity:   . Worried About Charity fundraiser in the Last Year: Not on file  . Ran Out of Food in the Last Year: Not on file  Transportation Needs:   . Lack of Transportation (Medical): Not on file  . Lack of Transportation (Non-Medical): Not on file  Physical Activity:   . Days of Exercise per Week: Not on file  . Minutes of Exercise per Session: Not on file  Stress:   . Feeling of Stress : Not on file  Social Connections:   . Frequency of Communication with Friends and Family: Not on file  . Frequency of Social Gatherings with Friends and Family: Not on file  . Attends Religious Services: Not on file  .  Active Member of Clubs or Organizations: Not on file  . Attends Archivist Meetings: Not on file  . Marital Status: Not on file  Intimate Partner Violence:   . Fear of Current or Ex-Partner: Not on file  . Emotionally Abused: Not on file  . Physically Abused: Not on file  . Sexually Abused: Not on file    Family History  Problem Relation Age of Onset  . Diabetes Maternal Uncle   . Heart attack Father   . Kidney cancer Paternal Uncle   . Breast cancer Neg Hx   . Bladder Cancer Neg Hx      Current Outpatient Medications:  .  amitriptyline (ELAVIL) 25 MG tablet, Take by mouth., Disp: , Rfl:  .  amLODipine (NORVASC) 5 MG tablet, TAKE 1 TABLET BY MOUTH ONCE DAILY, Disp: , Rfl:  .  atorvastatin (LIPITOR) 40 MG tablet, TAKE ONE TABLET BY MOUTH ONCE DAILY, Disp: , Rfl:  .  atorvastatin (LIPITOR) 40 MG tablet,  Take by mouth., Disp: , Rfl:  .  Blood Glucose Monitoring Suppl (ONE TOUCH ULTRA 2) w/Device KIT, See admin instructions., Disp: , Rfl:  .  Cysteamine Bitartrate (PROCYSBI) 300 MG PACK, Use 1 each once daily Use as instructed., Disp: , Rfl:  .  FLUoxetine (PROZAC) 20 MG capsule, Take by mouth., Disp: , Rfl:  .  lamoTRIgine (LAMICTAL) 100 MG tablet, Take by mouth., Disp: , Rfl:  .  Lancets (ONETOUCH DELICA PLUS QIONGE95M) MISC, , Disp: , Rfl:  .  metFORMIN (GLUCOPHAGE-XR) 500 MG 24 hr tablet, Take 500 mg by mouth 3 (three) times daily. Take 4 tabs (2,000 mg total) by mouth daily with dinner , Disp: , Rfl:  .  metFORMIN (GLUCOPHAGE-XR) 500 MG 24 hr tablet, Take by mouth., Disp: , Rfl:  .  metoprolol tartrate (LOPRESSOR) 25 MG tablet, Take by mouth., Disp: , Rfl:  .  nystatin (MYCOSTATIN/NYSTOP) powder, Apply topically., Disp: , Rfl:  .  omeprazole (PRILOSEC) 20 MG capsule, Take 20 mg by mouth daily. , Disp: , Rfl:  .  ondansetron (ZOFRAN-ODT) 4 MG disintegrating tablet, Take by mouth., Disp: , Rfl:  .  ONE TOUCH ULTRA TEST test strip, , Disp: , Rfl:  .  OneTouch Delica  Lancets 84X MISC, Use once daily to check blood sugars dx e11.9, Disp: , Rfl:  .  warfarin (COUMADIN) 1 MG tablet, Take by mouth., Disp: , Rfl:  .  albuterol (PROVENTIL HFA;VENTOLIN HFA) 108 (90 Base) MCG/ACT inhaler, Inhale into the lungs. (Patient not taking: Reported on 12/17/2019), Disp: , Rfl:  .  ASPIRIN 81 PO, 1 tab by mouth as needed (Patient not taking: Reported on 12/17/2019), Disp: , Rfl:  .  chlorpheniramine-HYDROcodone (TUSSIONEX) 10-8 MG/5ML SUER, Take by mouth. (Patient not taking: Reported on 12/17/2019), Disp: , Rfl:  .  conjugated estrogens (PREMARIN) vaginal cream, Place 1 Applicatorful vaginally daily. Apply 0.44m (pea-sized amount)  just inside the vaginal introitus with a finger-tip every night for two weeks and then Monday, Wednesday and Friday nights. (Patient not taking: Reported on 12/17/2019), Disp: 30 g, Rfl: 12 .  fluticasone furoate-vilanterol (BREO ELLIPTA) 100-25 MCG/INH AEPB, INHALE ONE PUFF BY MOUTH ONCE DAILY (Patient not taking: Reported on 12/17/2019), Disp: , Rfl:  .  hydrochlorothiazide (HYDRODIURIL) 25 MG tablet, Take 25 mg by mouth daily.  (Patient not taking: Reported on 12/17/2019), Disp: , Rfl:  .  lisinopril (PRINIVIL,ZESTRIL) 20 MG tablet, Take 20 mg by mouth daily. , Disp: , Rfl:  .  mirabegron ER (MYRBETRIQ) 25 MG TB24 tablet, Take 1 tablet (25 mg total) by mouth daily. (Patient not taking: Reported on 12/17/2019), Disp: 30 tablet, Rfl: 11  Physical exam:  Vitals:   12/17/19 1047  BP: 106/66  Pulse: 84  Resp: 16  SpO2: 100%  Weight: 142 lb 11.2 oz (64.7 kg)   Physical Exam Constitutional:      General: She is not in acute distress. Cardiovascular:     Rate and Rhythm: Normal rate and regular rhythm.     Heart sounds: Normal heart sounds.  Pulmonary:     Effort: Pulmonary effort is normal.     Breath sounds: Normal breath sounds.  Abdominal:     General: Bowel sounds are normal.     Palpations: Abdomen is soft.  Skin:    General: Skin is warm  and dry.  Neurological:     Mental Status: She is alert and oriented to person, place, and time.      CMP Latest Ref  Rng & Units 12/17/2019  Glucose 70 - 99 mg/dL 112(H)  BUN 6 - 20 mg/dL 15  Creatinine 0.44 - 1.00 mg/dL 1.01(H)  Sodium 135 - 145 mmol/L 138  Potassium 3.5 - 5.1 mmol/L 4.4  Chloride 98 - 111 mmol/L 101  CO2 22 - 32 mmol/L 27  Calcium 8.9 - 10.3 mg/dL 9.3  Total Protein 6.5 - 8.1 g/dL 7.7  Total Bilirubin 0.3 - 1.2 mg/dL 0.5  Alkaline Phos 38 - 126 U/L 118  AST 15 - 41 U/L 42(H)  ALT 0 - 44 U/L 35   CBC Latest Ref Rng & Units 12/17/2019  WBC 4.0 - 10.5 K/uL 14.4(H)  Hemoglobin 12.0 - 15.0 g/dL 10.1(L)  Hematocrit 36 - 46 % 34.9(L)  Platelets 150 - 400 K/uL 506(H)    Assessment and plan- Patient is a 59 y.o. female referred for leukocytosis  Patient has had chronic leukocytosis dating back to 2018 at least and her white count fluctuates between 12-14.  Differential has mainly shown neutrophilia and at times lymphocytosis.  She has had work-up in the past including flow cytometry and BCR ABL testing which was negative.  Overall her leukocytosis is stable and can be monitored without the need for any further work-up or a bone marrow biopsy.  Since I last saw the patient 2 years ago patient has become more anemic and her hemoglobin has been fluctuating between 8-10 with persistent microcytosis.  I suspect patient has an element of iron deficiency and I will do a complete anemia work-up today including CBC with differential, CMP, ferritin and iron studies, B12 and folate, myeloma panel, haptoglobin, TSH and reticulocyte count.  If she has evidence of iron deficiency she would benefit from oral or IV iron.  Patient is also on Coumadin which can predispose her to bleeding but she has a mechanical aortic valve and needs anticoagulation.  Coumadin will therefore not be stopped.  Patient denies any dark melanotic stools, bright red blood in stool or consistent use of  NSAIDs.  Given her symptoms of bloating, nausea as well as abdominal pain and ongoing iron deficiency anemia I would like to refer her to GI.  Patient's mother would like the patient to be seen at [facility] and we will make referral for the same.  I will see the patient back in 1 week's time to discuss the results of blood work and further management   Total face to face encounter time for this patient visit was 30 min.       Visit Diagnosis 1. Microcytic anemia      Dr. Randa Evens, MD, MPH [facility] at [facility] [phone] 12/17/2019 3:07 PM

## 2019-12-18 LAB — HAPTOGLOBIN: Haptoglobin: 140 mg/dL (ref 33–346)

## 2019-12-21 LAB — MULTIPLE MYELOMA PANEL, SERUM
Albumin SerPl Elph-Mcnc: 3.7 g/dL (ref 2.9–4.4)
Albumin/Glob SerPl: 1.2 (ref 0.7–1.7)
Alpha 1: 0.3 g/dL (ref 0.0–0.4)
Alpha2 Glob SerPl Elph-Mcnc: 0.8 g/dL (ref 0.4–1.0)
B-Globulin SerPl Elph-Mcnc: 1.2 g/dL (ref 0.7–1.3)
Gamma Glob SerPl Elph-Mcnc: 1 g/dL (ref 0.4–1.8)
Globulin, Total: 3.3 g/dL (ref 2.2–3.9)
IgA: 322 mg/dL (ref 87–352)
IgG (Immunoglobin G), Serum: 953 mg/dL (ref 586–1602)
IgM (Immunoglobulin M), Srm: 177 mg/dL (ref 26–217)
Total Protein ELP: 7 g/dL (ref 6.0–8.5)

## 2019-12-23 DIAGNOSIS — R112 Nausea with vomiting, unspecified: Secondary | ICD-10-CM | POA: Diagnosis not present

## 2019-12-23 DIAGNOSIS — K760 Fatty (change of) liver, not elsewhere classified: Secondary | ICD-10-CM | POA: Diagnosis not present

## 2019-12-23 DIAGNOSIS — D72829 Elevated white blood cell count, unspecified: Secondary | ICD-10-CM | POA: Diagnosis not present

## 2019-12-23 DIAGNOSIS — Z7901 Long term (current) use of anticoagulants: Secondary | ICD-10-CM | POA: Diagnosis not present

## 2019-12-24 ENCOUNTER — Inpatient Hospital Stay: Payer: PPO | Attending: Oncology | Admitting: Oncology

## 2019-12-24 ENCOUNTER — Inpatient Hospital Stay: Payer: PPO

## 2019-12-24 ENCOUNTER — Encounter: Payer: Self-pay | Admitting: Oncology

## 2019-12-24 ENCOUNTER — Other Ambulatory Visit: Payer: Self-pay

## 2019-12-24 VITALS — BP 150/73 | HR 77 | Resp 19

## 2019-12-24 DIAGNOSIS — D508 Other iron deficiency anemias: Secondary | ICD-10-CM

## 2019-12-24 DIAGNOSIS — D72829 Elevated white blood cell count, unspecified: Secondary | ICD-10-CM | POA: Diagnosis not present

## 2019-12-24 DIAGNOSIS — D509 Iron deficiency anemia, unspecified: Secondary | ICD-10-CM | POA: Insufficient documentation

## 2019-12-24 DIAGNOSIS — Z79899 Other long term (current) drug therapy: Secondary | ICD-10-CM | POA: Diagnosis not present

## 2019-12-24 MED ORDER — SODIUM CHLORIDE 0.9 % IV SOLN
510.0000 mg | Freq: Once | INTRAVENOUS | Status: AC
  Administered 2019-12-24: 510 mg via INTRAVENOUS
  Filled 2019-12-24: qty 17

## 2019-12-24 MED ORDER — SODIUM CHLORIDE 0.9 % IV SOLN
Freq: Once | INTRAVENOUS | Status: AC
  Filled 2019-12-24: qty 250

## 2019-12-24 NOTE — Progress Notes (Signed)
Hematology/Oncology Consult note [facility]  [phone] [phone]  Patient Care Team: [name], MD as PCP - General (Internal Medicine) Ubaldo Glassing Javier Docker, MD as Consulting Physician (Cardiology)   Name of the patient: Brianna Harris  [id number]  26-Nov-1960   Date of visit: 12/24/19  Diagnosis-leukocytosis likely reactive Iron deficiency anemia  Chief complaint/ Reason for visit-discuss results of blood work  Heme/Onc history: Patient is a 59 year old female last seen by me in August 2019 for leukocytosis.Blood work from 07/19/16 showed cbc with wbc of 14.5 with neutrophilia and lymphocytosis. Bcr abl testing was negative for cml.Peripheral flow cytometry showed no significant immunophenotypic abnormality. Absolute increases in both the anti-lymphocyte subsets are detected. No monoclonal B-cell population detected. Results support a reactive from the neoplastic process.  At that time patient was asked to follow-up with her PCP.  She has now been rereferred for the same problem.  Most recent CBC From 12/15/2019 showed white count of 12.9, H&H of 9.6/34.9 with an MCV of 71.8.  Of note patient was not anemic when I had seen her back in 2019 but since then she has been anemic with a hemoglobin that has remained around 9 with persistent microcytosis.  Of note patient also underwent heart valve surgery and is currently on Coumadin which is being managed by cardiology.  Patient needs a sign language interpreter  Interval history- reports ongoing fatigue. No new complaints.   ECOG PS- 1 Pain scale- 0   Review of systems- Review of Systems  Constitutional: Positive for malaise/fatigue. Negative for chills, fever and weight loss.  HENT: Negative for congestion, ear discharge and nosebleeds.   Eyes: Negative for blurred vision.  Respiratory: Negative for cough, hemoptysis, sputum production, shortness of breath and wheezing.     Cardiovascular: Negative for chest pain, palpitations, orthopnea and claudication.  Gastrointestinal: Negative for abdominal pain, blood in stool, constipation, diarrhea, heartburn, melena, nausea and vomiting.  Genitourinary: Negative for dysuria, flank pain, frequency, hematuria and urgency.  Musculoskeletal: Negative for back pain, joint pain and myalgias.  Skin: Negative for rash.  Neurological: Negative for dizziness, tingling, focal weakness, seizures, weakness and headaches.  Endo/Heme/Allergies: Does not bruise/bleed easily.  Psychiatric/Behavioral: Negative for depression and suicidal ideas. The patient does not have insomnia.       No Known Allergies   Past Medical History:  Diagnosis Date  . Asthma   . Deafness   . Depression   . Diabetes mellitus without complication ([place])   . GERD (gastroesophageal reflux disease)   . Heart abnormality    per mother pt has bad heart murmor- sees Dr Ubaldo Glassing   . Hyperlipidemia   . Hypertension      Past Surgical History:  Procedure Laterality Date  . ABDOMINAL HYSTERECTOMY  2000   per mother [facility] thought pt cancer and did hyster ,cervix and ovaries  . CHOLECYSTECTOMY    . COLONOSCOPY WITH PROPOFOL N/A 08/17/2016   Procedure: COLONOSCOPY WITH PROPOFOL;  Surgeon: Manya Silvas, MD;  Location: [facility] ENDOSCOPY;  Service: Endoscopy;  Laterality: N/A;  . ESOPHAGOGASTRODUODENOSCOPY (EGD) WITH PROPOFOL N/A 08/17/2016   Procedure: ESOPHAGOGASTRODUODENOSCOPY (EGD) WITH PROPOFOL;  Surgeon: Manya Silvas, MD;  Location: [facility] ENDOSCOPY;  Service: Endoscopy;  Laterality: N/A;  . PACEMAKER IMPLANT  01/2018    Social History   Socioeconomic History  . Marital status: Single    Spouse name: Not on file  . Number of children: Not on file  . Years of education: Not  on file  . Highest education level: Not on file  Occupational History  . Not on file  Tobacco Use  . Smoking status: Former Smoker    Packs/day: 1.50    Years: 39.00    Pack  years: 58.50    Types: Cigarettes    Quit date: 02/13/2018    Years since quitting: 1.8  . Smokeless tobacco: Never Used  Substance and Sexual Activity  . Alcohol use: Yes    Alcohol/week: 5.0 standard drinks    Types: 5 Glasses of wine per week  . Drug use: No    Types: Marijuana    Comment: smoked 3 yr ago,for 2 y. Hx of cocaine use   . Sexual activity: Never  Other Topics Concern  . Not on file  Social History Narrative  . Not on file   Social Determinants of Health   Financial Resource Strain:   . Difficulty of Paying Living Expenses: Not on file  Food Insecurity:   . Worried About Charity fundraiser in the Last Year: Not on file  . Ran Out of Food in the Last Year: Not on file  Transportation Needs:   . Lack of Transportation (Medical): Not on file  . Lack of Transportation (Non-Medical): Not on file  Physical Activity:   . Days of Exercise per Week: Not on file  . Minutes of Exercise per Session: Not on file  Stress:   . Feeling of Stress : Not on file  Social Connections:   . Frequency of Communication with Friends and Family: Not on file  . Frequency of Social Gatherings with Friends and Family: Not on file  . Attends Religious Services: Not on file  . Active Member of Clubs or Organizations: Not on file  . Attends Archivist Meetings: Not on file  . Marital Status: Not on file  Intimate Partner Violence:   . Fear of Current or Ex-Partner: Not on file  . Emotionally Abused: Not on file  . Physically Abused: Not on file  . Sexually Abused: Not on file    Family History  Problem Relation Age of Onset  . Diabetes Maternal Uncle   . Heart attack Father   . Kidney cancer Paternal Uncle   . Breast cancer Neg Hx   . Bladder Cancer Neg Hx      Current Outpatient Medications:  .  amitriptyline (ELAVIL) 25 MG tablet, Take by mouth., Disp: , Rfl:  .  atorvastatin (LIPITOR) 40 MG tablet, Take by mouth., Disp: , Rfl:  .  Blood Glucose Monitoring Suppl  (ONE TOUCH ULTRA 2) w/Device KIT, See admin instructions., Disp: , Rfl:  .  FLUoxetine (PROZAC) 20 MG capsule, Take by mouth., Disp: , Rfl:  .  lamoTRIgine (LAMICTAL) 100 MG tablet, Take by mouth., Disp: , Rfl:  .  Lancets (ONETOUCH DELICA PLUS [id number]) MISC, , Disp: , Rfl:  .  metFORMIN (GLUCOPHAGE-XR) 500 MG 24 hr tablet, Take by mouth., Disp: , Rfl:  .  metoprolol tartrate (LOPRESSOR) 25 MG tablet, Take by mouth., Disp: , Rfl:  .  omeprazole (PRILOSEC) 20 MG capsule, Take 20 mg by mouth daily. , Disp: , Rfl:  .  ONE TOUCH ULTRA TEST test strip, , Disp: , Rfl:  .  OneTouch Delica Lancets 23T MISC, Use once daily to check blood sugars dx e11.9, Disp: , Rfl:  .  albuterol (PROVENTIL HFA;VENTOLIN HFA) 108 (90 Base) MCG/ACT inhaler, Inhale into the lungs. (Patient not taking: Reported on 12/17/2019),  Disp: , Rfl:  .  amLODipine (NORVASC) 5 MG tablet, TAKE 1 TABLET BY MOUTH ONCE DAILY (Patient not taking: Reported on 12/24/2019), Disp: , Rfl:  .  ASPIRIN 81 PO, 1 tab by mouth as needed (Patient not taking: Reported on 12/17/2019), Disp: , Rfl:  .  atorvastatin (LIPITOR) 40 MG tablet, TAKE ONE TABLET BY MOUTH ONCE DAILY (Patient not taking: Reported on 12/24/2019), Disp: , Rfl:  .  chlorpheniramine-HYDROcodone (TUSSIONEX) 10-8 MG/5ML SUER, Take by mouth. (Patient not taking: Reported on 12/17/2019), Disp: , Rfl:  .  conjugated estrogens (PREMARIN) vaginal cream, Place 1 Applicatorful vaginally daily. Apply 0.54m (pea-sized amount)  just inside the vaginal introitus with a finger-tip every night for two weeks and then Monday, Wednesday and Friday nights. (Patient not taking: Reported on 12/17/2019), Disp: 30 g, Rfl: 12 .  Cysteamine Bitartrate (PROCYSBI) 300 MG PACK, Use 1 each once daily Use as instructed. (Patient not taking: Reported on 12/24/2019), Disp: , Rfl:  .  fluticasone furoate-vilanterol (BREO ELLIPTA) 100-25 MCG/INH AEPB, INHALE ONE PUFF BY MOUTH ONCE DAILY (Patient not taking: Reported on  12/17/2019), Disp: , Rfl:  .  hydrochlorothiazide (HYDRODIURIL) 25 MG tablet, Take 25 mg by mouth daily.  (Patient not taking: Reported on 12/17/2019), Disp: , Rfl:  .  lisinopril (PRINIVIL,ZESTRIL) 20 MG tablet, Take 20 mg by mouth daily. , Disp: , Rfl:  .  metFORMIN (GLUCOPHAGE-XR) 500 MG 24 hr tablet, Take 500 mg by mouth 3 (three) times daily. Take 4 tabs (2,000 mg total) by mouth daily with dinner , Disp: , Rfl:  .  mirabegron ER (MYRBETRIQ) 25 MG TB24 tablet, Take 1 tablet (25 mg total) by mouth daily. (Patient not taking: Reported on 12/17/2019), Disp: 30 tablet, Rfl: 11 .  nystatin (MYCOSTATIN/NYSTOP) powder, Apply topically., Disp: , Rfl:  .  warfarin (COUMADIN) 1 MG tablet, Take by mouth., Disp: , Rfl:   Physical exam: There were no vitals filed for this visit. Physical Exam Constitutional:      General: She is not in acute distress. Pulmonary:     Effort: Pulmonary effort is normal.  Skin:    General: Skin is warm and dry.  Neurological:     Mental Status: She is alert and oriented to person, place, and time.      CMP Latest Ref Rng & Units 12/17/2019  Glucose 70 - 99 mg/dL 112(H)  BUN 6 - 20 mg/dL 15  Creatinine 0.44 - 1.00 mg/dL 1.01(H)  Sodium 135 - 145 mmol/L 138  Potassium 3.5 - 5.1 mmol/L 4.4  Chloride 98 - 111 mmol/L 101  CO2 22 - 32 mmol/L 27  Calcium 8.9 - 10.3 mg/dL 9.3  Total Protein 6.5 - 8.1 g/dL 7.7  Total Bilirubin 0.3 - 1.2 mg/dL 0.5  Alkaline Phos 38 - 126 U/L 118  AST 15 - 41 U/L 42(H)  ALT 0 - 44 U/L 35   CBC Latest Ref Rng & Units 12/17/2019  WBC 4.0 - 10.5 K/uL 14.4(H)  Hemoglobin 12.0 - 15.0 g/dL 10.1(L)  Hematocrit 36 - 46 % 34.9(L)  Platelets 150 - 400 K/uL 506(H)      Assessment and plan- Patient is a 59y.o. female referred for leucocytosis. She is here to discuss results of bloodwork  1. Leucocytosis: this is chronic atleast since 2018. Overall stable. Mainly neutrophilia. Prior peripheral blood work up was unremarkable. No need for BM  biopsy. Continue to monitor  2. Microcytic anemia: labs consistent with iron deficiency anemia. No other cause of  anemia. Recommend 2 doses of feraheme 510 mg IV weekly. Discussed risks and benefits of IV iron including all but limited to nausea, headache and possible risks of infusion reaction. Patient understands and agrees to proceed as planned  With regards to etiology of iron deficiency- referral to [facility] made as per patients mothers request  I will see her back in 2 months with repeat cbc ferritin and iron studies   Visit Diagnosis 1. Other iron deficiency anemia      Dr. Randa Evens, MD, MPH [facility] at [facility] [phone] 12/24/2019 10:23 AM

## 2019-12-24 NOTE — Progress Notes (Signed)
Pt tolerated Feraheme infusion well with no signs of complications. VSS prior and post infusion. Pt was observed for 30 minutes post infusion per protocol. Pt and her mother was educated on the importance of notifying the clinic if any complications occur at home, appointments handed to pt.'s mother. Pt verbalized understanding and all questions answered at this time. Pt stable for discharge and has no complaints at this time.   Caitlen Worth [employer]

## 2019-12-31 DIAGNOSIS — Z7901 Long term (current) use of anticoagulants: Secondary | ICD-10-CM | POA: Diagnosis not present

## 2020-01-01 ENCOUNTER — Other Ambulatory Visit: Payer: Self-pay

## 2020-01-01 ENCOUNTER — Inpatient Hospital Stay: Payer: PPO

## 2020-01-01 VITALS — BP 140/74 | HR 76 | Resp 18

## 2020-01-01 DIAGNOSIS — D72829 Elevated white blood cell count, unspecified: Secondary | ICD-10-CM | POA: Diagnosis not present

## 2020-01-01 DIAGNOSIS — D508 Other iron deficiency anemias: Secondary | ICD-10-CM

## 2020-01-01 MED ORDER — SODIUM CHLORIDE 0.9 % IV SOLN
Freq: Once | INTRAVENOUS | Status: AC
  Filled 2020-01-01: qty 250

## 2020-01-01 MED ORDER — SODIUM CHLORIDE 0.9 % IV SOLN
510.0000 mg | Freq: Once | INTRAVENOUS | Status: AC
  Administered 2020-01-01: 510 mg via INTRAVENOUS
  Filled 2020-01-01: qty 510

## 2020-01-04 ENCOUNTER — Other Ambulatory Visit: Payer: Self-pay

## 2020-01-04 DIAGNOSIS — R109 Unspecified abdominal pain: Secondary | ICD-10-CM

## 2020-01-12 ENCOUNTER — Encounter: Payer: Self-pay | Admitting: *Deleted

## 2020-01-13 ENCOUNTER — Telehealth: Payer: Self-pay | Admitting: *Deleted

## 2020-01-13 DIAGNOSIS — E119 Type 2 diabetes mellitus without complications: Secondary | ICD-10-CM | POA: Diagnosis not present

## 2020-01-13 DIAGNOSIS — I10 Essential (primary) hypertension: Secondary | ICD-10-CM | POA: Diagnosis not present

## 2020-01-13 DIAGNOSIS — H9193 Unspecified hearing loss, bilateral: Secondary | ICD-10-CM | POA: Diagnosis not present

## 2020-01-13 DIAGNOSIS — I442 Atrioventricular block, complete: Secondary | ICD-10-CM | POA: Diagnosis not present

## 2020-01-13 DIAGNOSIS — I350 Nonrheumatic aortic (valve) stenosis: Secondary | ICD-10-CM | POA: Diagnosis not present

## 2020-01-13 DIAGNOSIS — I9789 Other postprocedural complications and disorders of the circulatory system, not elsewhere classified: Secondary | ICD-10-CM | POA: Diagnosis not present

## 2020-01-13 DIAGNOSIS — R001 Bradycardia, unspecified: Secondary | ICD-10-CM | POA: Diagnosis not present

## 2020-01-13 NOTE — Telephone Encounter (Signed)
Mother called today to let us know that Dr. Janese Banks had referred patient to GI and the mother called over to the GI office this morning stating that she sees in the chart that next appointment GI is December 21. Dr. Janese Banks wanted her to be seen sooner than that so I have called over to GI and left a message to see if the appointment could be moved up and if they can send me a message and either email secure chat or phone call and change the appointment then I would be glad to call the patient and her family member to let them know.

## 2020-01-14 ENCOUNTER — Other Ambulatory Visit: Payer: Self-pay

## 2020-01-15 ENCOUNTER — Telehealth: Payer: Self-pay

## 2020-01-15 ENCOUNTER — Encounter: Payer: Self-pay | Admitting: Gastroenterology

## 2020-01-15 ENCOUNTER — Ambulatory Visit: Payer: PPO | Admitting: Gastroenterology

## 2020-01-15 ENCOUNTER — Other Ambulatory Visit: Payer: Self-pay

## 2020-01-15 VITALS — BP 123/79 | HR 69 | Temp 97.9°F | Wt 144.1 lb

## 2020-01-15 DIAGNOSIS — K760 Fatty (change of) liver, not elsewhere classified: Secondary | ICD-10-CM

## 2020-01-15 DIAGNOSIS — D509 Iron deficiency anemia, unspecified: Secondary | ICD-10-CM | POA: Diagnosis not present

## 2020-01-15 MED ORDER — NA SULFATE-K SULFATE-MG SULF 17.5-3.13-1.6 GM/177ML PO SOLN: 354.0000 mL | Freq: Once | ORAL | 0 refills | Status: AC

## 2020-01-15 NOTE — Telephone Encounter (Signed)
   [facility] Pre-operative Risk Assessment    Request for surgical clearance:  1. What type of surgery is being performed? Colonoscopy And EGD   2. When is this surgery scheduled?  02/02/2020  3. Are there any medications that need to be held prior to surgery and how long? Warfarin. Patient needs Cardiac clearance also.   4. Practice name and name of physician performing surgery? [facility] and Dr. Marius Ditch  5. What is your office phone and fax number?  [phone]  6. Anesthesia type (None, local, MAC, general) ? General    Shelby Mattocks 01/15/2020, 11:21 AM  _________________________________________________________________   (provider comments below)

## 2020-01-15 NOTE — Telephone Encounter (Signed)
Patient is at [facility] clinic please disregard

## 2020-01-15 NOTE — Progress Notes (Signed)
Brianna Darby, MD [address]  [place]  [place], [place] [address]  Main: [phone]  Fax: [phone]    Gastroenterology Consultation  Referring Provider:     [name], MD Primary Care Physician:  [name], MD Primary Gastroenterologist:  Dr. Cephas Harris Reason for Consultation:     Iron deficiency anemia        HPI:   Brianna Harris is a 59 y.o. female referred by Dr. Edwina Barth, Chrystie Nose, MD  for consultation & management of iron deficiency anemia.  Patient is chronic iron deficiency anemia of unclear etiology.  Patient has history of aortic valve stenosis s/p AVR on 02/14/2018 at [facility], has been maintained on Coumadin since then, s/p pacemaker due to high-grade heart block perioperatively.  Review of records revealed that she has anemia for last 2 years.  Lowest hemoglobin was 9.5, does have severe iron deficiency, serum ferritin 6 in 11/2019.  Patient has seen Dr. Janese Banks for this, received 2 iron infusions.  Most recent hemoglobin was 10.1, MCV 69.4 in 11/2019.  Patient reports that her energy levels are improving.  She continues to have craving for ice.  She quit smoking since her valve replacement.  Patient is accompanied by an interpreter as well as her mom.  Patient denies any GI symptoms today.  She reports having 1-2 soft bowel movements which is normal for her.  She denies any rectal bleeding  NSAIDs: None  Antiplts/Anticoagulants/Anti thrombotics: Coumadin for history of AVR  GI Procedures:  EGD and colonoscopy 08/17/2016 - Normal esophagus. - Small hiatal hernia. - Gastritis. Biopsied. - Normal examined duodenum.  - One diminutive polyp in the rectum, removed with a jumbo cold forceps. Resected and retrieved. - One 10 mm polyp in the sigmoid colon, removed with a hot snare. Resected and retrieved. - Internal hemorrhoids. - The examination was otherwise normal.  DIAGNOSIS:  A. STOMACH, ANTRUM AND BODY; COLD BIOPSY:  - OXYNTIC  MUCOSA WITH MODERATE CHRONIC GASTRITIS AND FOCAL INTESTINAL  METAPLASIA.  - IMMUNOHISTOCHEMICAL STAIN FOR H. PYLORI WILL BE REPORTED IN AN  ADDENDUM.  - NEGATIVE FOR DYSPLASIA AND MALIGNANCY.   B. RECTUM POLYP; COLD BIOPSY:  - HYPERPLASTIC POLYP.  - NEGATIVE FOR DYSPLASIA AND MALIGNANCY.   C. COLON POLYP, SIGMOID; HOT SNARE:  - HYPERPLASTIC POLYP.  - NEGATIVE FOR DYSPLASIA AND MALIGNANCY.   ADDENDUM:  An immunohistochemical stain for H. pylori was performed and is  negative. Stain controls worked appropriately.   Past Medical History:  Diagnosis Date  . Asthma   . Deafness   . Depression   . Diabetes mellitus without complication ([place])   . GERD (gastroesophageal reflux disease)   . Heart abnormality    per mother pt has bad heart murmor- sees Dr Ubaldo Glassing   . Hyperlipidemia   . Hypertension     Past Surgical History:  Procedure Laterality Date  . ABDOMINAL HYSTERECTOMY  2000   per mother [facility] thought pt cancer and did hyster ,cervix and ovaries  . CHOLECYSTECTOMY    . COLONOSCOPY WITH PROPOFOL N/A 08/17/2016   Procedure: COLONOSCOPY WITH PROPOFOL;  Surgeon: Manya Silvas, MD;  Location: [facility] ENDOSCOPY;  Service: Endoscopy;  Laterality: N/A;  . ESOPHAGOGASTRODUODENOSCOPY (EGD) WITH PROPOFOL N/A 08/17/2016   Procedure: ESOPHAGOGASTRODUODENOSCOPY (EGD) WITH PROPOFOL;  Surgeon: Manya Silvas, MD;  Location: [facility] ENDOSCOPY;  Service: Endoscopy;  Laterality: N/A;  . PACEMAKER IMPLANT  01/2018    Current Outpatient Medications:  .  amitriptyline (  ELAVIL) 25 MG tablet, Take by mouth., Disp: , Rfl:  .  amLODipine (NORVASC) 5 MG tablet, TAKE 1 TABLET BY MOUTH ONCE DAILY, Disp: , Rfl:  .  atorvastatin (LIPITOR) 40 MG tablet, Take by mouth., Disp: , Rfl:  .  Blood Glucose Monitoring Suppl (ONE TOUCH ULTRA 2) w/Device KIT, See admin instructions., Disp: , Rfl:  .  FLUoxetine (PROZAC) 20 MG capsule, Take by mouth., Disp: , Rfl:  .  lamoTRIgine (LAMICTAL) 100 MG tablet, Take by  mouth., Disp: , Rfl:  .  Lancets (ONETOUCH DELICA PLUS [id number]) MISC, , Disp: , Rfl:  .  metFORMIN (GLUCOPHAGE-XR) 500 MG 24 hr tablet, Take by mouth., Disp: , Rfl:  .  metoprolol tartrate (LOPRESSOR) 25 MG tablet, Take by mouth., Disp: , Rfl:  .  nystatin (MYCOSTATIN/NYSTOP) powder, Apply topically., Disp: , Rfl:  .  omeprazole (PRILOSEC) 20 MG capsule, Take 20 mg by mouth daily. , Disp: , Rfl:  .  ONE TOUCH ULTRA TEST test strip, , Disp: , Rfl:  .  OneTouch Delica Lancets 40J MISC, Use once daily to check blood sugars dx e11.9, Disp: , Rfl:  .  warfarin (COUMADIN) 1 MG tablet, Take by mouth., Disp: , Rfl:  .  albuterol (PROVENTIL HFA;VENTOLIN HFA) 108 (90 Base) MCG/ACT inhaler, Inhale into the lungs. (Patient not taking: Reported on 12/17/2019), Disp: , Rfl:  .  ASPIRIN 81 PO, 1 tab by mouth as needed (Patient not taking: Reported on 12/17/2019), Disp: , Rfl:  .  atorvastatin (LIPITOR) 40 MG tablet, TAKE ONE TABLET BY MOUTH ONCE DAILY (Patient not taking: Reported on 12/24/2019), Disp: , Rfl:  .  chlorpheniramine-HYDROcodone (TUSSIONEX) 10-8 MG/5ML SUER, Take by mouth. (Patient not taking: Reported on 12/17/2019), Disp: , Rfl:  .  conjugated estrogens (PREMARIN) vaginal cream, Place 1 Applicatorful vaginally daily. Apply 0.28m (pea-sized amount)  just inside the vaginal introitus with a finger-tip every night for two weeks and then Monday, Wednesday and Friday nights. (Patient not taking: Reported on 12/17/2019), Disp: 30 g, Rfl: 12 .  Cysteamine Bitartrate (PROCYSBI) 300 MG PACK, Use 1 each once daily Use as instructed. (Patient not taking: Reported on 12/24/2019), Disp: , Rfl:  .  fluticasone furoate-vilanterol (BREO ELLIPTA) 100-25 MCG/INH AEPB, INHALE ONE PUFF BY MOUTH ONCE DAILY (Patient not taking: Reported on 12/17/2019), Disp: , Rfl:  .  hydrochlorothiazide (HYDRODIURIL) 25 MG tablet, Take 25 mg by mouth daily.  (Patient not taking: Reported on 12/17/2019), Disp: , Rfl:  .  lisinopril  (PRINIVIL,ZESTRIL) 20 MG tablet, Take 20 mg by mouth daily.  (Patient not taking: Reported on 01/15/2020), Disp: , Rfl:  .  mirabegron ER (MYRBETRIQ) 25 MG TB24 tablet, Take 1 tablet (25 mg total) by mouth daily. (Patient not taking: Reported on 12/17/2019), Disp: 30 tablet, Rfl: 11 .  Na Sulfate-K Sulfate-Mg Sulf 17.5-3.13-1.6 GM/177ML SOLN, Take 354 mLs by mouth once for 1 dose., Disp: 354 mL, Rfl: 0   Family History  Problem Relation Age of Onset  . Diabetes Maternal Uncle   . Heart attack Father   . Kidney cancer Paternal Uncle   . Breast cancer Neg Hx   . Bladder Cancer Neg Hx      Social History   Tobacco Use  . Smoking status: Former Smoker    Packs/day: 1.50    Years: 39.00    Pack years: 58.50    Types: Cigarettes    Quit date: 02/13/2018    Years since quitting: 1.9  . Smokeless tobacco: Never  Used  Substance Use Topics  . Alcohol use: Yes    Alcohol/week: 5.0 standard drinks    Types: 5 Glasses of wine per week  . Drug use: No    Types: Marijuana    Comment: smoked 3 yr ago,for 2 y. Hx of cocaine use     Allergies as of 01/15/2020  . (No Known Allergies)    Review of Systems:    All systems reviewed and negative except where noted in HPI.   Physical Exam:  BP 123/79 (BP Location: Left Arm, Patient Position: Sitting, Cuff Size: Normal)   Pulse 69   Temp 97.9 F (36.6 C) (Oral)   Wt 144 lb 2 oz (65.4 kg)   BMI 26.36 kg/m  No LMP recorded. Patient has had a hysterectomy.  General:   Alert,  Well-developed, well-nourished, pleasant and cooperative in NAD Head:  Normocephalic and atraumatic. Eyes:  Sclera clear, no icterus.   Conjunctiva pink. Ears:  Normal auditory acuity. Nose:  No deformity, discharge, or lesions. Mouth:  No deformity or lesions,oropharynx pink & moist. Neck:  Supple; no masses or thyromegaly. Lungs:  Respirations even and unlabored.  Clear throughout to auscultation.   No wheezes, crackles, or rhonchi. No acute distress. Heart:   Regular rate and rhythm; no murmurs, clicks, rubs, or gallops. Abdomen:  Normal bowel sounds. Soft, non-tender and non-distended without masses, hepatosplenomegaly or hernias noted.  No guarding or rebound tenderness.   Rectal: Not performed Msk:  Symmetrical without gross deformities. Good, equal movement & strength bilaterally. Pulses:  Normal pulses noted. Extremities:  No clubbing or edema.  No cyanosis. Neurologic:  Alert and oriented x3;  grossly normal neurologically. Skin:  Intact without significant lesions or rashes. No jaundice. Lymph Nodes:  No significant cervical adenopathy. Psych:  Alert and cooperative. Normal mood and affect.  Imaging Studies: Fatty liver based on CT in 11/2013  Assessment and Plan:   ARMIYAH CAPRON is a 59 y.o. female with metabolic syndrome, bicuspid aortic valve s/p valve replacement in October 2019, s/p pacemaker on Coumadin is seen in consultation for chronic iron deficiency anemia  Chronic iron deficiency anemia of unclear etiology Recommend upper endoscopy as well as colonoscopy with gastric, duodenal biopsies, TI evaluation Will obtain preop clearance from Dr. Ubaldo Glassing as well as regarding interruption of anticoagulation before procedures  I have discussed alternative options, risks & benefits,  which include, but are not limited to, bleeding, infection, perforation,respiratory complication & drug reaction.  The patient agrees with this plan & written consent will be obtained.   Follow-up with Dr. Janese Banks for iron infusions  Fatty liver Mildly elevated AST Discussed about lifestyle modification Will discuss about ultrasound liver during next visit    Follow up in 2 months   Brianna Darby, MD

## 2020-01-19 DIAGNOSIS — F17200 Nicotine dependence, unspecified, uncomplicated: Secondary | ICD-10-CM | POA: Diagnosis not present

## 2020-01-19 DIAGNOSIS — E119 Type 2 diabetes mellitus without complications: Secondary | ICD-10-CM | POA: Diagnosis not present

## 2020-01-20 ENCOUNTER — Telehealth: Payer: Self-pay

## 2020-01-20 NOTE — Telephone Encounter (Signed)
Dr. Ubaldo Glassing office states the stop the Warfarin 5 days before procedure and then restart 1 day after.

## 2020-01-20 NOTE — Telephone Encounter (Signed)
Called and patient mother verbalized understanding. She states she will check with cardiology her self because she feels like that is to long to be off blood thinner. Informed her that this is coming from cardiology and If she is not off the medication they will not do the procedure. She verbalized understanding

## 2020-01-20 NOTE — Telephone Encounter (Signed)
Called 01/19/2020 and 01/20/2020 to check on the blood thinner request to Dr. Ubaldo Glassing. They sent two messages to the nurses

## 2020-01-28 DIAGNOSIS — Z7901 Long term (current) use of anticoagulants: Secondary | ICD-10-CM | POA: Diagnosis not present

## 2020-01-29 ENCOUNTER — Other Ambulatory Visit: Payer: Self-pay

## 2020-01-29 ENCOUNTER — Other Ambulatory Visit
Admission: RE | Admit: 2020-01-29 | Discharge: 2020-01-29 | Disposition: A | Discharge status: Home / Self Care | Payer: PPO | Source: Ambulatory Visit | Attending: Gastroenterology | Admitting: Gastroenterology

## 2020-01-29 DIAGNOSIS — Z01818 Encounter for other preprocedural examination: Secondary | ICD-10-CM | POA: Insufficient documentation

## 2020-01-29 DIAGNOSIS — Z20822 Contact with and (suspected) exposure to covid-19: Secondary | ICD-10-CM | POA: Insufficient documentation

## 2020-01-29 LAB — SARS CORONAVIRUS 2 (TAT 6-24 HRS): SARS Coronavirus 2: NEGATIVE

## 2020-02-02 ENCOUNTER — Encounter: Payer: Self-pay | Admitting: Gastroenterology

## 2020-02-02 ENCOUNTER — Encounter
Admission: RE | Disposition: A | Discharge status: Home / Self Care | Payer: Self-pay | Source: Home / Self Care | Attending: Gastroenterology

## 2020-02-02 ENCOUNTER — Ambulatory Visit
Admission: RE | Admit: 2020-02-02 | Discharge: 2020-02-02 | Disposition: A | Discharge status: Home / Self Care | Payer: PPO | Attending: Gastroenterology | Admitting: Gastroenterology

## 2020-02-02 ENCOUNTER — Ambulatory Visit: Payer: PPO | Admitting: Certified Registered"

## 2020-02-02 DIAGNOSIS — K5520 Angiodysplasia of colon without hemorrhage: Secondary | ICD-10-CM | POA: Diagnosis not present

## 2020-02-02 DIAGNOSIS — K5730 Diverticulosis of large intestine without perforation or abscess without bleeding: Secondary | ICD-10-CM | POA: Diagnosis not present

## 2020-02-02 DIAGNOSIS — Z79899 Other long term (current) drug therapy: Secondary | ICD-10-CM | POA: Insufficient documentation

## 2020-02-02 DIAGNOSIS — I10 Essential (primary) hypertension: Secondary | ICD-10-CM | POA: Insufficient documentation

## 2020-02-02 DIAGNOSIS — Z8249 Family history of ischemic heart disease and other diseases of the circulatory system: Secondary | ICD-10-CM | POA: Insufficient documentation

## 2020-02-02 DIAGNOSIS — Z7901 Long term (current) use of anticoagulants: Secondary | ICD-10-CM | POA: Diagnosis not present

## 2020-02-02 DIAGNOSIS — Z9049 Acquired absence of other specified parts of digestive tract: Secondary | ICD-10-CM | POA: Insufficient documentation

## 2020-02-02 DIAGNOSIS — E119 Type 2 diabetes mellitus without complications: Secondary | ICD-10-CM | POA: Insufficient documentation

## 2020-02-02 DIAGNOSIS — Z7984 Long term (current) use of oral hypoglycemic drugs: Secondary | ICD-10-CM | POA: Diagnosis not present

## 2020-02-02 DIAGNOSIS — K31A11 Gastric intestinal metaplasia without dysplasia, involving the antrum: Secondary | ICD-10-CM | POA: Diagnosis not present

## 2020-02-02 DIAGNOSIS — Z833 Family history of diabetes mellitus: Secondary | ICD-10-CM | POA: Insufficient documentation

## 2020-02-02 DIAGNOSIS — K2970 Gastritis, unspecified, without bleeding: Secondary | ICD-10-CM | POA: Diagnosis not present

## 2020-02-02 DIAGNOSIS — Z7982 Long term (current) use of aspirin: Secondary | ICD-10-CM | POA: Diagnosis not present

## 2020-02-02 DIAGNOSIS — Z87891 Personal history of nicotine dependence: Secondary | ICD-10-CM | POA: Diagnosis not present

## 2020-02-02 DIAGNOSIS — Z7951 Long term (current) use of inhaled steroids: Secondary | ICD-10-CM | POA: Diagnosis not present

## 2020-02-02 DIAGNOSIS — K2950 Unspecified chronic gastritis without bleeding: Secondary | ICD-10-CM | POA: Diagnosis not present

## 2020-02-02 DIAGNOSIS — E785 Hyperlipidemia, unspecified: Secondary | ICD-10-CM | POA: Diagnosis not present

## 2020-02-02 DIAGNOSIS — J45909 Unspecified asthma, uncomplicated: Secondary | ICD-10-CM | POA: Insufficient documentation

## 2020-02-02 DIAGNOSIS — D509 Iron deficiency anemia, unspecified: Secondary | ICD-10-CM | POA: Diagnosis not present

## 2020-02-02 DIAGNOSIS — Z950 Presence of cardiac pacemaker: Secondary | ICD-10-CM | POA: Diagnosis not present

## 2020-02-02 DIAGNOSIS — K3189 Other diseases of stomach and duodenum: Secondary | ICD-10-CM | POA: Diagnosis not present

## 2020-02-02 DIAGNOSIS — F32A Depression, unspecified: Secondary | ICD-10-CM | POA: Insufficient documentation

## 2020-02-02 DIAGNOSIS — Z90710 Acquired absence of both cervix and uterus: Secondary | ICD-10-CM | POA: Diagnosis not present

## 2020-02-02 DIAGNOSIS — K219 Gastro-esophageal reflux disease without esophagitis: Secondary | ICD-10-CM | POA: Insufficient documentation

## 2020-02-02 DIAGNOSIS — Z8051 Family history of malignant neoplasm of kidney: Secondary | ICD-10-CM | POA: Diagnosis not present

## 2020-02-02 HISTORY — PX: COLONOSCOPY WITH PROPOFOL: SHX5780

## 2020-02-02 HISTORY — PX: ESOPHAGOGASTRODUODENOSCOPY (EGD) WITH PROPOFOL: SHX5813

## 2020-02-02 LAB — GLUCOSE, CAPILLARY: Glucose-Capillary: 223 mg/dL — ABNORMAL HIGH (ref 70–99)

## 2020-02-02 SURGERY — COLONOSCOPY WITH PROPOFOL
Anesthesia: General

## 2020-02-02 MED ORDER — GLYCOPYRROLATE 0.2 MG/ML IJ SOLN
INTRAMUSCULAR | Status: DC | PRN
  Administered 2020-02-02: .2 mg via INTRAVENOUS

## 2020-02-02 MED ORDER — PROPOFOL 10 MG/ML IV BOLUS
INTRAVENOUS | Status: DC | PRN
  Administered 2020-02-02: 20 mg via INTRAVENOUS
  Administered 2020-02-02 (×2): 40 mg via INTRAVENOUS
  Administered 2020-02-02: 60 mg via INTRAVENOUS

## 2020-02-02 MED ORDER — ESMOLOL HCL 100 MG/10ML IV SOLN
INTRAVENOUS | Status: DC | PRN
  Administered 2020-02-02: 10 mg via INTRAVENOUS

## 2020-02-02 MED ORDER — LIDOCAINE HCL (CARDIAC) PF 100 MG/5ML IV SOSY
PREFILLED_SYRINGE | INTRAVENOUS | Status: DC | PRN
  Administered 2020-02-02: 100 mg via INTRAVENOUS

## 2020-02-02 MED ORDER — SODIUM CHLORIDE 0.9 % IV SOLN: INTRAVENOUS | Status: DC

## 2020-02-02 MED ORDER — PHENYLEPHRINE HCL (PRESSORS) 10 MG/ML IV SOLN
INTRAVENOUS | Status: DC | PRN
  Administered 2020-02-02: 100 ug via INTRAVENOUS

## 2020-02-02 MED ORDER — PROPOFOL 500 MG/50ML IV EMUL
INTRAVENOUS | Status: DC | PRN
  Administered 2020-02-02: 150 ug/kg/min via INTRAVENOUS

## 2020-02-02 NOTE — Anesthesia Procedure Notes (Signed)
Procedure Name: General with mask airway Performed by: Fletcher-Harrison, Collin Rengel, CRNA Pre-anesthesia Checklist: Patient identified, Emergency Drugs available, Suction available and Patient being monitored Patient Re-evaluated:Patient Re-evaluated prior to induction Oxygen Delivery Method: Simple face mask Induction Type: IV induction Placement Confirmation: positive ETCO2 and CO2 detector Dental Injury: Teeth and Oropharynx as per pre-operative assessment        

## 2020-02-02 NOTE — Op Note (Signed)
[facility] Gastroenterology Patient Name: Brianna Harris Procedure Date: 02/02/2020 9:23 AM MRN: [id number] Account #: [id number] Date of Birth: 20-Jul-1960 Admit Type: Outpatient Age: 59 Room: [facility] ENDO ROOM 1 Gender: Female Note Status: Finalized Procedure:             Upper GI endoscopy Indications:           Unexplained iron deficiency anemia Providers:             [name] MD, MD Medicines:             Monitored Anesthesia Care Complications:         No immediate complications. Estimated blood loss: None. Procedure:             Pre-Anesthesia Assessment:                        - Prior to the procedure, a History and Physical was                         performed, and patient medications and allergies were                         reviewed. The patient is competent. The risks and                         benefits of the procedure and the sedation options and                         risks were discussed with the patient. All questions                         were answered and informed consent was obtained.                         Patient identification and proposed procedure were                         verified by the physician, the nurse, the                         anesthesiologist, the anesthetist and the technician                         in the pre-procedure area in the procedure room in the                         endoscopy suite. Mental Status Examination: alert and                         oriented. Airway Examination: normal oropharyngeal                         airway and neck mobility. Respiratory Examination:                         clear to auscultation. CV Examination: normal.                         Prophylactic Antibiotics: The patient does  not require                         prophylactic antibiotics. Prior Anticoagulants: The                         patient has taken Coumadin (warfarin), last dose was 3                         days  prior to procedure. ASA Grade Assessment: III - A                         patient with severe systemic disease. After reviewing                         the risks and benefits, the patient was deemed in                         satisfactory condition to undergo the procedure. The                         anesthesia plan was to use monitored anesthesia care                         (MAC). Immediately prior to administration of                         medications, the patient was re-assessed for adequacy                         to receive sedatives. The heart rate, respiratory                         rate, oxygen saturations, blood pressure, adequacy of                         pulmonary ventilation, and response to care were                         monitored throughout the procedure. The physical                         status of the patient was re-assessed after the                         procedure.                        After obtaining informed consent, the endoscope was                         passed under direct vision. Throughout the procedure,                         the patient's blood pressure, pulse, and oxygen                         saturations were monitored continuously. The Endoscope  was introduced through the mouth, and advanced to the                         second part of duodenum. The upper GI endoscopy was                         accomplished without difficulty. The patient tolerated                         the procedure well. Findings:      The examined duodenum was normal. Biopsies for histology were taken with       a cold forceps for evaluation of celiac disease.      Striped mildly erythematous mucosa without bleeding was found in the       gastric body and in the gastric antrum. Biopsies were taken with a cold       forceps for histology.      Esophagogastric landmarks were identified: the gastroesophageal junction       was found at 35 cm from  the incisors.      The gastroesophageal junction and examined esophagus were normal. Impression:            - Normal examined duodenum. Biopsied.                        - Erythematous mucosa in the gastric body and antrum.                         Biopsied.                        - Esophagogastric landmarks identified.                        - Normal gastroesophageal junction and esophagus. Recommendation:        - Await pathology results.                        - Continue present medications.                        - Proceed with colonoscopy as scheduled                        See colonoscopy report Procedure Code(s):     --- Professional ---                        [phone], Esophagogastroduodenoscopy, flexible,                         transoral; with biopsy, single or multiple Diagnosis Code(s):     --- Professional ---                        K31.89, Other diseases of stomach and duodenum                        D50.9, Iron deficiency anemia, unspecified CPT copyright 2019 American Medical Association. All rights reserved. The codes documented in this report are preliminary and upon coder review may  be revised to meet current compliance requirements. Dr.  Ronini Myrl Bynum Raeanne Gathers MD, MD 02/02/2020 9:50:47 AM This report has been signed electronically. Number of Addenda: 0 Note Initiated On: 02/02/2020 9:23 AM Estimated Blood Loss:  Estimated blood loss: none.      [facility]

## 2020-02-02 NOTE — Anesthesia Postprocedure Evaluation (Signed)
Anesthesia Post Note  Patient: Brianna Harris  Procedure(s) Performed: COLONOSCOPY WITH PROPOFOL (N/A ) ESOPHAGOGASTRODUODENOSCOPY (EGD) WITH PROPOFOL  Patient location during evaluation: Endoscopy Anesthesia Type: General Level of consciousness: awake and alert Pain management: pain level controlled Vital Signs Assessment: post-procedure vital signs reviewed and stable Respiratory status: spontaneous breathing, nonlabored ventilation, respiratory function stable and patient connected to nasal cannula oxygen Cardiovascular status: blood pressure returned to baseline and stable Postop Assessment: no apparent nausea or vomiting Anesthetic complications: no   No complications documented.   Last Vitals:  Vitals:   02/02/20 1018 02/02/20 1038  BP: 129/70 (!) 123/94  Pulse: (!) 113   Resp: 18   Temp: 36.7 C   SpO2: 100%     Last Pain:  Vitals:   02/02/20 1038  TempSrc:   PainSc: 0-No pain                 Arita Miss

## 2020-02-02 NOTE — Anesthesia Preprocedure Evaluation (Signed)
Anesthesia Evaluation  Patient identified by MRN, date of birth, ID band Patient awake    Reviewed: Allergy & Precautions, H&P , NPO status , Patient's Chart, lab work & pertinent test results, reviewed documented beta blocker date and time   History of Anesthesia Complications Negative for: history of anesthetic complications  Airway Mallampati: III  TM Distance: >3 FB Neck ROM: full    Dental no notable dental hx. (+) Teeth Intact   Pulmonary neg shortness of breath, asthma , neg sleep apnea, neg COPD, neg recent URI, Patient abstained from smoking.Not current smoker, former smoker,    Pulmonary exam normal breath sounds clear to auscultation       Cardiovascular Exercise Tolerance: Poor hypertension, On Medications (-) CAD, (-) Past MI, (-) Cardiac Stents and (-) CABG (-) dysrhythmias + pacemaker + Valvular Problems/Murmurs  Rhythm:Regular Rate:Normal - Systolic murmurs S/p SAVR Clean coronaries on cath   Neuro/Psych PSYCHIATRIC DISORDERS (Depression) Depression negative neurological ROS     GI/Hepatic GERD  ,(+)     substance abuse  alcohol use, Fatty liver disease   Endo/Other  diabetes, Type 2, Oral Hypoglycemic Agents  Renal/GU negative Renal ROS  negative genitourinary   Musculoskeletal   Abdominal   Peds  Hematology negative hematology ROS (+)   Anesthesia Other Findings Past Medical History: No date: Asthma No date: Deafness No date: Depression No date: Diabetes mellitus without complication ([facility]) No date: GERD (gastroesophageal reflux disease) No date: Heart abnormality     Comment: per mother pt has bad heart murmor- sees Dr               Ubaldo Glassing  No date: Hyperlipidemia No date: Hypertension   Reproductive/Obstetrics negative OB ROS                             Anesthesia Physical  Anesthesia Plan  ASA: III  Anesthesia Plan: General   Post-op Pain Management:     Induction: Intravenous  PONV Risk Score and Plan: 3 and Ondansetron, Propofol infusion and TIVA  Airway Management Planned: Nasal Cannula  Additional Equipment: None  Intra-op Plan:   Post-operative Plan:   Informed Consent: I have reviewed the patients History and Physical, chart, labs and discussed the procedure including the risks, benefits and alternatives for the proposed anesthesia with the patient or authorized representative who has indicated his/her understanding and acceptance.     Dental Advisory Given  Plan Discussed with: Anesthesiologist, CRNA and Surgeon  Anesthesia Plan Comments: (Discussed risks of anesthesia with patient, including possibility of difficulty with spontaneous ventilation under anesthesia necessitating airway intervention, PONV, and rare risks such as cardiac or respiratory or neurological events. Patient understands.)        Anesthesia Quick Evaluation

## 2020-02-02 NOTE — H&P (Signed)
Cephas Darby, MD [address]  [place]  [place], [place] [address]  Main: [phone]  Fax: [phone] Pager: [phone]  Primary Care Physician:  [name], MD Primary Gastroenterologist:  Dr. Cephas Darby  Pre-Procedure History & Physical: HPI:  Brianna Harris is a 59 y.o. female is here for an EGD and a colonoscopy.   Past Medical History:  Diagnosis Date  . Asthma   . Deafness   . Depression   . Diabetes mellitus without complication ([place])   . GERD (gastroesophageal reflux disease)   . Heart abnormality    per mother pt has bad heart murmor- sees Dr Ubaldo Glassing   . Hyperlipidemia   . Hypertension     Past Surgical History:  Procedure Laterality Date  . ABDOMINAL HYSTERECTOMY  2000   per mother [facility] thought pt cancer and did hyster ,cervix and ovaries  . CHOLECYSTECTOMY    . COLONOSCOPY WITH PROPOFOL N/A 08/17/2016   Procedure: COLONOSCOPY WITH PROPOFOL;  Surgeon: Manya Silvas, MD;  Location: [facility] ENDOSCOPY;  Service: Endoscopy;  Laterality: N/A;  . ESOPHAGOGASTRODUODENOSCOPY (EGD) WITH PROPOFOL N/A 08/17/2016   Procedure: ESOPHAGOGASTRODUODENOSCOPY (EGD) WITH PROPOFOL;  Surgeon: Manya Silvas, MD;  Location: [facility] ENDOSCOPY;  Service: Endoscopy;  Laterality: N/A;  . PACEMAKER IMPLANT  01/2018    Prior to Admission medications   Medication Sig Start Date End Date Taking? Authorizing Provider  albuterol (PROVENTIL HFA;VENTOLIN HFA) 108 (90 Base) MCG/ACT inhaler Inhale into the lungs. Patient not taking: Reported on 12/17/2019 06/25/16   [provider]  amitriptyline (ELAVIL) 25 MG tablet Take by mouth. 05/04/19   [provider]  amLODipine (NORVASC) 5 MG tablet TAKE 1 TABLET BY MOUTH ONCE DAILY 12/06/16   [provider]  ASPIRIN 81 PO 1 tab by mouth as needed Patient not taking: Reported on 12/17/2019 12/05/09   [provider]  atorvastatin (LIPITOR) 40 MG tablet TAKE ONE TABLET BY MOUTH ONCE DAILY Patient not  taking: Reported on 12/24/2019 01/25/16   [provider]  atorvastatin (LIPITOR) 40 MG tablet Take by mouth. 10/19/19   [provider]  Blood Glucose Monitoring Suppl (ONE TOUCH ULTRA 2) w/Device KIT See admin instructions. 10/02/19   [provider]  chlorpheniramine-HYDROcodone ([place]) 10-8 MG/5ML SUER Take by mouth. Patient not taking: Reported on 12/17/2019 12/21/16   [provider]  conjugated estrogens (PREMARIN) vaginal cream Place 1 Applicatorful vaginally daily. Apply 0.58m (pea-sized amount)  just inside the vaginal introitus with a finger-tip every night for two weeks and then Monday, Wednesday and Friday nights. Patient not taking: Reported on 12/17/2019 01/03/17   MZara CouncilA, PA-C  Cysteamine Bitartrate (PROCYSBI) 300 MG PACK Use 1 each once daily Use as instructed. Patient not taking: Reported on 12/24/2019 10/02/19   [provider]  FLUoxetine (PROZAC) 20 MG capsule Take by mouth. 07/01/19 06/30/20  [provider]  fluticasone furoate-vilanterol (BREO ELLIPTA) 100-25 MCG/INH AEPB INHALE ONE PUFF BY MOUTH ONCE DAILY Patient not taking: Reported on 12/17/2019 03/03/15   [provider]  hydrochlorothiazide (HYDRODIURIL) 25 MG tablet Take 25 mg by mouth daily.  Patient not taking: Reported on 12/17/2019 05/25/15   [provider]  lamoTRIgine (LAMICTAL) 100 MG tablet Take by mouth. 11/18/18   [provider]  Lancets (ONETOUCH DELICA PLUS [id number] [place] 10/05/19   [provider]  lisinopril (PRINIVIL,ZESTRIL) 20 MG tablet Take 20 mg by mouth daily.  Patient not taking: Reported on 01/15/2020 07/22/15 01/03/17  [provider]  metFORMIN (GLUCOPHAGE-XR) 500 MG 24 hr tablet Take by mouth. 07/22/19 07/21/20  [provider]  metoprolol tartrate (LOPRESSOR) 25 MG tablet Take by mouth. 05/04/19   [provider]  mirabegron ER (MYRBETRIQ) 25 MG TB24 tablet Take 1 tablet (25 mg  total) by mouth daily. Patient not taking: Reported on 12/17/2019 06/19/18   Zara Council A, PA-C  nystatin (MYCOSTATIN/NYSTOP) powder Apply topically. 08/26/19 08/25/20  [provider]  omeprazole (PRILOSEC) 20 MG capsule Take 20 mg by mouth daily.  06/21/15   [provider]  ONE TOUCH ULTRA TEST test strip  05/08/16   [provider]  OneTouch Delica Lancets 62H MISC Use once daily to check blood sugars dx e11.9 10/05/19   [provider]  warfarin (COUMADIN) 1 MG tablet Take by mouth. 05/25/19   [provider]    Allergies as of 01/15/2020  . (No Known Allergies)    Family History  Problem Relation Age of Onset  . Diabetes Maternal Uncle   . Heart attack Father   . Kidney cancer Paternal Uncle   . Breast cancer Neg Hx   . Bladder Cancer Neg Hx     Social History   Socioeconomic History  . Marital status: Single    Spouse name: Not on file  . Number of children: Not on file  . Years of education: Not on file  . Highest education level: Not on file  Occupational History  . Not on file  Tobacco Use  . Smoking status: Former Smoker    Packs/day: 1.50    Years: 39.00    Pack years: 58.50    Types: Cigarettes    Quit date: 02/13/2018    Years since quitting: 1.9  . Smokeless tobacco: Never Used  Substance and Sexual Activity  . Alcohol use: Yes    Alcohol/week: 5.0 standard drinks    Types: 5 Glasses of wine per week  . Drug use: No    Types: Marijuana    Comment: smoked 3 yr ago,for 2 y. Hx of cocaine use   . Sexual activity: Never  Other Topics Concern  . Not on file  Social History Narrative  . Not on file   Social Determinants of Health   Financial Resource Strain:   . Difficulty of Paying Living Expenses: Not on file  Food Insecurity:   . Worried About Charity fundraiser in the Last Year: Not on file  . Ran Out of Food in the Last Year: Not on file  Transportation Needs:   . Lack of Transportation (Medical): Not  on file  . Lack of Transportation (Non-Medical): Not on file  Physical Activity:   . Days of Exercise per Week: Not on file  . Minutes of Exercise per Session: Not on file  Stress:   . Feeling of Stress : Not on file  Social Connections:   . Frequency of Communication with Friends and Family: Not on file  . Frequency of Social Gatherings with Friends and Family: Not on file  . Attends Religious Services: Not on file  . Active Member of Clubs or Organizations: Not on file  . Attends Archivist Meetings: Not on file  . Marital Status: Not on file  Intimate Partner Violence:   . Fear of Current or Ex-Partner: Not on file  . Emotionally Abused: Not on file  . Physically Abused: Not on file  . Sexually Abused: Not on file    Review  of Systems: See HPI, otherwise negative ROS  Physical Exam: BP (!) 131/91   Pulse (!) 113   Temp (!) 96.1 F (35.6 C) (Temporal)   Resp 18   Ht 5' 2" (1.575 m)   Wt 64.4 kg   SpO2 99%   BMI 25.97 kg/m  General:   Alert,  pleasant and cooperative in NAD Head:  Normocephalic and atraumatic. Neck:  Supple; no masses or thyromegaly. Lungs:  Clear throughout to auscultation.    Heart:  Regular rate and rhythm. Abdomen:  Soft, nontender and nondistended. Normal bowel sounds, without guarding, and without rebound.   Neurologic:  Alert and  oriented x4;  grossly normal neurologically.  Impression/Plan: Brianna Harris is here for an EGD and a colonoscopy to be performed for IDA  Risks, benefits, limitations, and alternatives regarding  endoscopy and colonoscopy have been reviewed with the patient.  Questions have been answered.  All parties agreeable.   Sherri Sear, MD  02/02/2020, 8:50 AM

## 2020-02-02 NOTE — Transfer of Care (Signed)
Immediate Anesthesia Transfer of Care Note  Patient: Brianna Harris  Procedure(s) Performed: COLONOSCOPY WITH PROPOFOL (N/A ) ESOPHAGOGASTRODUODENOSCOPY (EGD) WITH PROPOFOL  Patient Location: Endoscopy Unit  Anesthesia Type:General  Level of Consciousness: drowsy, patient cooperative and responds to stimulation  Airway & Oxygen Therapy: Patient Spontanous Breathing and Patient connected to face mask oxygen  Post-op Assessment: Report given to RN and Post -op Vital signs reviewed and stable  Post vital signs: Reviewed and stable  Last Vitals:  Vitals Value Taken Time  BP 129/70 02/02/20 1017  Temp    Pulse 113 02/02/20 1019  Resp 19 02/02/20 1019  SpO2 100 % 02/02/20 1019  Vitals shown include unvalidated device data.  Last Pain:  Vitals:   02/02/20 0840  TempSrc: Temporal  PainSc: 0-No pain         Complications: No complications documented.

## 2020-02-02 NOTE — Op Note (Signed)
[facility] Gastroenterology Patient Name: Brianna Harris Procedure Date: 02/02/2020 9:22 AM MRN: [id number] Account #: [id number] Date of Birth: [birth year] Admit Type: Outpatient Age: 59 Room: [facility] ENDO ROOM 1 Gender: Female Note Status: Finalized Procedure:             Colonoscopy Indications:           Last colonoscopy: April 2018, Unexplained iron                         deficiency anemia Providers:             [name] MD, MD Medicines:             General Anesthesia Complications:         No immediate complications. Estimated blood loss: None. Procedure:             Pre-Anesthesia Assessment:                        - Prior to the procedure, a History and Physical was                         performed, and patient medications and allergies were                         reviewed. The patient is competent. The risks and                         benefits of the procedure and the sedation options and                         risks were discussed with the patient. All questions                         were answered and informed consent was obtained.                         Patient identification and proposed procedure were                         verified by the physician, the nurse, the                         anesthesiologist, the anesthetist and the technician                         in the pre-procedure area in the procedure room in the                         endoscopy suite. Mental Status Examination: alert and                         oriented. Airway Examination: normal oropharyngeal                         airway and neck mobility. Respiratory Examination:                         clear to auscultation. CV Examination: normal.  Prophylactic Antibiotics: The patient does not require                         prophylactic antibiotics. Prior Anticoagulants: The                         patient has taken Coumadin (warfarin), last dose  was 3                         days prior to procedure. ASA Grade Assessment: III - A                         patient with severe systemic disease. After reviewing                         the risks and benefits, the patient was deemed in                         satisfactory condition to undergo the procedure. The                         anesthesia plan was to use general anesthesia.                         Immediately prior to administration of medications,                         the patient was re-assessed for adequacy to receive                         sedatives. The heart rate, respiratory rate, oxygen                         saturations, blood pressure, adequacy of pulmonary                         ventilation, and response to care were monitored                         throughout the procedure. The physical status of the                         patient was re-assessed after the procedure.                        After obtaining informed consent, the colonoscope was                         passed under direct vision. Throughout the procedure,                         the patient's blood pressure, pulse, and oxygen                         saturations were monitored continuously. The                         Colonoscope was introduced through the anus and  advanced to the the terminal ileum, with                         identification of the appendiceal orifice and IC                         valve. The colonoscopy was performed with moderate                         difficulty due to multiple diverticula in the colon.                         Successful completion of the procedure was aided by                         applying abdominal pressure. The patient tolerated the                         procedure well. The quality of the bowel preparation                         was evaluated using the BBPS [facility] Bowel Preparation                         Scale) with scores of:  Right Colon = 3, Transverse                         Colon = 3 and Left Colon = 3 (entire mucosa seen well                         with no residual staining, small fragments of stool or                         opaque liquid). The total BBPS score equals 9. Findings:      The perianal and digital rectal examinations were normal. Pertinent       negatives include normal sphincter tone and no palpable rectal lesions.      The terminal ileum appeared normal.      Multiple diverticula were found in the sigmoid colon.      A single large localized angioectasia without bleeding was found in the       cecum. Coagulation for bleeding prevention using argon plasma was       successful. Estimated blood loss was minimal.      The retroflexed view of the distal rectum and anal verge was normal and       showed no anal or rectal abnormalities. Impression:            - The examined portion of the ileum was normal.                        - Diverticulosis in the sigmoid colon.                        - A single non-bleeding colonic angioectasia. Treated                         with argon plasma coagulation (APC).                        -  The distal rectum and anal verge are normal on                         retroflexion view.                        - No specimens collected. Recommendation:        - Discharge patient to home (with escort).                        - Resume previous diet today.                        - Continue present medications.                        - Return to my office as previously scheduled. Procedure Code(s):     --- Professional ---                        [phone], Colonoscopy, flexible; with control of                         bleeding, any method Diagnosis Code(s):     --- Professional ---                        K55.20, Angiodysplasia of colon without hemorrhage                        D50.9, Iron deficiency anemia, unspecified                        K57.30, Diverticulosis of large  intestine without                         perforation or abscess without bleeding CPT copyright 2019 [employer] Medical Association. All rights reserved. The codes documented in this report are preliminary and upon coder review may  be revised to meet current compliance requirements. Dr. Ulyess Mort [name] MD, MD 02/02/2020 10:17:08 AM This report has been signed electronically. Number of Addenda: 0 Note Initiated On: 02/02/2020 9:22 AM Scope Withdrawal Time: 0 hours 14 minutes 24 seconds  Total Procedure Duration: 0 hours 20 minutes 42 seconds  Estimated Blood Loss:  Estimated blood loss was minimal.      [facility]

## 2020-02-03 ENCOUNTER — Encounter: Payer: Self-pay | Admitting: Gastroenterology

## 2020-02-03 DIAGNOSIS — E119 Type 2 diabetes mellitus without complications: Secondary | ICD-10-CM | POA: Diagnosis not present

## 2020-02-03 LAB — SURGICAL PATHOLOGY

## 2020-02-04 ENCOUNTER — Inpatient Hospital Stay (HOSPITAL_BASED_OUTPATIENT_CLINIC_OR_DEPARTMENT_OTHER): Payer: PPO | Admitting: Oncology

## 2020-02-04 ENCOUNTER — Other Ambulatory Visit: Payer: Self-pay

## 2020-02-04 ENCOUNTER — Encounter: Payer: Self-pay | Admitting: Oncology

## 2020-02-04 ENCOUNTER — Inpatient Hospital Stay: Payer: PPO | Attending: Oncology

## 2020-02-04 VITALS — BP 117/72 | HR 66 | Temp 98.2°F | Resp 16 | Ht 62.0 in | Wt 142.5 lb

## 2020-02-04 DIAGNOSIS — J45909 Unspecified asthma, uncomplicated: Secondary | ICD-10-CM | POA: Diagnosis not present

## 2020-02-04 DIAGNOSIS — Z7984 Long term (current) use of oral hypoglycemic drugs: Secondary | ICD-10-CM | POA: Insufficient documentation

## 2020-02-04 DIAGNOSIS — Z7901 Long term (current) use of anticoagulants: Secondary | ICD-10-CM | POA: Diagnosis not present

## 2020-02-04 DIAGNOSIS — I10 Essential (primary) hypertension: Secondary | ICD-10-CM | POA: Insufficient documentation

## 2020-02-04 DIAGNOSIS — K219 Gastro-esophageal reflux disease without esophagitis: Secondary | ICD-10-CM | POA: Diagnosis not present

## 2020-02-04 DIAGNOSIS — Z833 Family history of diabetes mellitus: Secondary | ICD-10-CM | POA: Diagnosis not present

## 2020-02-04 DIAGNOSIS — Z8249 Family history of ischemic heart disease and other diseases of the circulatory system: Secondary | ICD-10-CM | POA: Diagnosis not present

## 2020-02-04 DIAGNOSIS — D509 Iron deficiency anemia, unspecified: Secondary | ICD-10-CM | POA: Diagnosis not present

## 2020-02-04 DIAGNOSIS — E785 Hyperlipidemia, unspecified: Secondary | ICD-10-CM | POA: Diagnosis not present

## 2020-02-04 DIAGNOSIS — E119 Type 2 diabetes mellitus without complications: Secondary | ICD-10-CM | POA: Diagnosis not present

## 2020-02-04 DIAGNOSIS — F329 Major depressive disorder, single episode, unspecified: Secondary | ICD-10-CM | POA: Insufficient documentation

## 2020-02-04 DIAGNOSIS — Z87891 Personal history of nicotine dependence: Secondary | ICD-10-CM | POA: Insufficient documentation

## 2020-02-04 DIAGNOSIS — D729 Disorder of white blood cells, unspecified: Secondary | ICD-10-CM

## 2020-02-04 DIAGNOSIS — D508 Other iron deficiency anemias: Secondary | ICD-10-CM

## 2020-02-04 DIAGNOSIS — Z79899 Other long term (current) drug therapy: Secondary | ICD-10-CM | POA: Insufficient documentation

## 2020-02-04 LAB — CBC WITH DIFFERENTIAL/PLATELET
Abs Immature Granulocytes: 0.04 10*3/uL (ref 0.00–0.07)
Basophils Absolute: 0.1 10*3/uL (ref 0.0–0.1)
Basophils Relative: 1 %
Eosinophils Absolute: 1 10*3/uL — ABNORMAL HIGH (ref 0.0–0.5)
Eosinophils Relative: 9 %
HCT: 41.6 % (ref 36.0–46.0)
Hemoglobin: 13.5 g/dL (ref 12.0–15.0)
Immature Granulocytes: 0 %
Lymphocytes Relative: 27 %
Lymphs Abs: 3 10*3/uL (ref 0.7–4.0)
MCH: 25.9 pg — ABNORMAL LOW (ref 26.0–34.0)
MCHC: 32.5 g/dL (ref 30.0–36.0)
MCV: 79.7 fL — ABNORMAL LOW (ref 80.0–100.0)
Monocytes Absolute: 0.5 10*3/uL (ref 0.1–1.0)
Monocytes Relative: 5 %
Neutro Abs: 6.7 10*3/uL (ref 1.7–7.7)
Neutrophils Relative %: 58 %
Platelets: 329 10*3/uL (ref 150–400)
RBC: 5.22 MIL/uL — ABNORMAL HIGH (ref 3.87–5.11)
Smear Review: NORMAL
WBC: 11.2 10*3/uL — ABNORMAL HIGH (ref 4.0–10.5)
nRBC: 0 % (ref 0.0–0.2)

## 2020-02-04 LAB — IRON AND TIBC
Iron: 79 ug/dL (ref 28–170)
Saturation Ratios: 29 % (ref 10.4–31.8)
TIBC: 276 ug/dL (ref 250–450)
UIBC: 197 ug/dL

## 2020-02-04 LAB — FERRITIN: Ferritin: 205 ng/mL (ref 11–307)

## 2020-02-04 NOTE — Progress Notes (Signed)
Pt had to come off coumadin and switch to lovenox for GI procedure. Started the coumadin yest. And has to keep lovenox inj until the INR gets to 2. Mother is wondering if we get a level today so we cna see if it is high enough to come off the lovenox- I explained that lovenox doe leave the spot and bruising. I do not think since she has been off coumadin for 5 days and she just started coumadin back yest. That it will be high enough to stop inj.

## 2020-02-04 NOTE — Progress Notes (Signed)
Hematology/Oncology Consult note [facility]  Telephone:([phone] [phone]  Patient Care Team: [name], MD as PCP - General (Internal Medicine) Ubaldo Glassing Javier Docker, MD as Consulting Physician (Cardiology)   Name of the patient: Brianna Harris  [id number]  [birth year]   Date of visit: 02/04/20  Diagnosis-iron deficiency anemia  Chief complaint/ Reason for visit-routine follow-up of iron deficiency anemia  Heme/Onc history: Patient is a 59 year old female last seen by me in August 2019 for leukocytosis.Blood work from 07/19/16 showed cbc with wbc of 14.5 with neutrophilia and lymphocytosis. Bcr abl testing was negative for cml.Peripheral flow cytometry showed no significant immunophenotypic abnormality. Absolute increases in both the anti-lymphocyte subsets are detected. No monoclonal B-cell population detected. Results support a reactive from the neoplastic process.At that time patient was asked to follow-up with her PCP. She has now been rereferred for the same problem.  Most recent CBCFrom 12/15/2019 showed white count of 12.9, H&H of 9.6/34.9 with an MCV of 71.8. Of note patient was not anemic when I had seen her back in 2019 but since then she has been anemic with a hemoglobin that has remained around 9 with persistent microcytosis. Of note patient also underwent heart valve surgery and is currently on Coumadin which is being managed by cardiology. Patient needs a sign language interpreter  Patient received 2 doses of Feraheme in September 2021.  Also underwent EGD and colonoscopy by Dr. Marius Ditch.  Colonoscopy showed 1 nonbleeding AVM which was treated with APC.Also had biopsies of duodenum and stomach.  Stomach biopsy showed intestinal metaplasia but otherwise negative for celiac dysplasia or malignancy.   Interval history-history obtained with the help of sign language interpreter.  Patient's mother also present with the patient.  Patient  reports bruising in the areas where she received Lovenox  ECOG PS- 1 Pain scale- 0   Review of systems- Review of Systems  Constitutional: Positive for malaise/fatigue. Negative for chills, fever and weight loss.  HENT: Negative for congestion, ear discharge and nosebleeds.   Eyes: Negative for blurred vision.  Respiratory: Negative for cough, hemoptysis, sputum production, shortness of breath and wheezing.   Cardiovascular: Negative for chest pain, palpitations, orthopnea and claudication.  Gastrointestinal: Negative for abdominal pain, blood in stool, constipation, diarrhea, heartburn, melena, nausea and vomiting.  Genitourinary: Negative for dysuria, flank pain, frequency, hematuria and urgency.  Musculoskeletal: Negative for back pain, joint pain and myalgias.  Skin: Negative for rash.  Neurological: Negative for dizziness, tingling, focal weakness, seizures, weakness and headaches.  Endo/Heme/Allergies: Does not bruise/bleed easily.  Psychiatric/Behavioral: Negative for depression and suicidal ideas. The patient does not have insomnia.      No Known Allergies   Past Medical History:  Diagnosis Date  . Asthma   . Deafness   . Depression   . Diabetes mellitus without complication ([place])   . GERD (gastroesophageal reflux disease)   . Heart abnormality    per mother pt has bad heart murmor- sees Dr Ubaldo Glassing   . Hyperlipidemia   . Hypertension      Past Surgical History:  Procedure Laterality Date  . ABDOMINAL HYSTERECTOMY  2000   per mother [facility] thought pt cancer and did hyster ,cervix and ovaries  . CHOLECYSTECTOMY    . COLONOSCOPY WITH PROPOFOL N/A 08/17/2016   Procedure: COLONOSCOPY WITH PROPOFOL;  Surgeon: Manya Silvas, MD;  Location[facility] ENDOSCOPY;  Service: Endoscopy;  Laterality: N/A;  . COLONOSCOPY WITH PROPOFOL N/A 02/02/2020   Procedure: COLONOSCOPY WITH PROPOFOL;  Surgeon: [name], MD;  Location: [facility] ENDOSCOPY;  Service: Gastroenterology;   Laterality: N/A;  . ESOPHAGOGASTRODUODENOSCOPY (EGD) WITH PROPOFOL N/A 08/17/2016   Procedure: ESOPHAGOGASTRODUODENOSCOPY (EGD) WITH PROPOFOL;  Surgeon: Manya Silvas, MD;  Location: [facility] ENDOSCOPY;  Service: Endoscopy;  Laterality: N/A;  . ESOPHAGOGASTRODUODENOSCOPY (EGD) WITH PROPOFOL  02/02/2020   Procedure: ESOPHAGOGASTRODUODENOSCOPY (EGD) WITH PROPOFOL;  Surgeon: [name], MD;  Location: [facility] ENDOSCOPY;  Service: Gastroenterology;;  . PACEMAKER IMPLANT  01/2018    Social History   Socioeconomic History  . Marital status: Single    Spouse name: Not on file  . Number of children: Not on file  . Years of education: Not on file  . Highest education level: Not on file  Occupational History  . Not on file  Tobacco Use  . Smoking status: Former Smoker    Packs/day: 1.50    Years: 39.00    Pack years: 58.50    Types: Cigarettes    Quit date: 02/13/2018    Years since quitting: 1.9  . Smokeless tobacco: Never Used  Vaping Use  . Vaping Use: Former  Substance and Sexual Activity  . Alcohol use: Not Currently  . Drug use: Yes    Types: Marijuana, "Crack" cocaine    Comment: smoked 3 yr ago,for 2 y. Hx of cocaine use   . Sexual activity: Never  Other Topics Concern  . Not on file  Social History Narrative  . Not on file   Social Determinants of Health   Financial Resource Strain:   . Difficulty of Paying Living Expenses: Not on file  Food Insecurity:   . Worried About Charity fundraiser in the Last Year: Not on file  . Ran Out of Food in the Last Year: Not on file  Transportation Needs:   . Lack of Transportation (Medical): Not on file  . Lack of Transportation (Non-Medical): Not on file  Physical Activity:   . Days of Exercise per Week: Not on file  . Minutes of Exercise per Session: Not on file  Stress:   . Feeling of Stress : Not on file  Social Connections:   . Frequency of Communication with Friends and Family: Not on file  . Frequency of Social  Gatherings with Friends and Family: Not on file  . Attends Religious Services: Not on file  . Active Member of Clubs or Organizations: Not on file  . Attends Archivist Meetings: Not on file  . Marital Status: Not on file  Intimate Partner Violence:   . Fear of Current or Ex-Partner: Not on file  . Emotionally Abused: Not on file  . Physically Abused: Not on file  . Sexually Abused: Not on file    Family History  Problem Relation Age of Onset  . Diabetes Maternal Uncle   . Heart attack Father   . Kidney cancer Paternal Uncle   . Breast cancer Neg Hx   . Bladder Cancer Neg Hx      Current Outpatient Medications:  .  amitriptyline (ELAVIL) 25 MG tablet, Take 25 mg by mouth at bedtime. , Disp: , Rfl:  .  amLODipine (NORVASC) 5 MG tablet, Take 2.5 mg by mouth in the morning and at bedtime. , Disp: , Rfl:  .  atorvastatin (LIPITOR) 40 MG tablet, TAKE ONE TABLET BY MOUTH ONCE DAILY, Disp: , Rfl:  .  atorvastatin (LIPITOR) 40 MG tablet, Take 40 mg by mouth daily. , Disp: , Rfl:  .  Blood  Glucose Monitoring Suppl (ONE TOUCH ULTRA 2) w/Device KIT, See admin instructions., Disp: , Rfl:  .  enoxaparin (LOVENOX) 60 MG/0.6ML injection, Inject 60 mg into the skin every 12 (twelve) hours. Only until the INR gets therapeutic and then d/c, Disp: , Rfl:  .  FLUoxetine (PROZAC) 20 MG capsule, Take 20 mg by mouth at bedtime. , Disp: , Rfl:  .  lamoTRIgine (LAMICTAL) 100 MG tablet, Take 50 mg by mouth 2 (two) times daily. , Disp: , Rfl:  .  Lancets (ONETOUCH DELICA PLUS EFEOFH21F) MISC, , Disp: , Rfl:  .  metFORMIN (GLUCOPHAGE-XR) 500 MG 24 hr tablet, Take 1,000 mg by mouth in the morning and at bedtime. , Disp: , Rfl:  .  metoprolol tartrate (LOPRESSOR) 25 MG tablet, Take 25 mg by mouth 2 (two) times daily. , Disp: , Rfl:  .  omeprazole (PRILOSEC) 20 MG capsule, Take 20 mg by mouth daily. , Disp: , Rfl:  .  ONE TOUCH ULTRA TEST test strip, , Disp: , Rfl:  .  OneTouch Delica Lancets 75O  MISC, Use once daily to check blood sugars dx e11.9, Disp: , Rfl:  .  warfarin (COUMADIN) 1 MG tablet, Take 2.5 mg by mouth one time only at 4 PM. , Disp: , Rfl:   Physical exam:  Vitals:   02/04/20 0957  BP: 117/72  Pulse: 66  Resp: 16  Temp: 98.2 F (36.8 C)  TempSrc: Oral  Weight: 142 lb 8 oz (64.6 kg)  Height: '5\' 2"'  (1.575 m)   Physical Exam Pulmonary:     Effort: Pulmonary effort is normal.  Skin:    General: Skin is warm and dry.     Comments: Bruising from lovenox  Neurological:     Mental Status: She is alert and oriented to person, place, and time.      CMP Latest Ref Rng & Units 12/17/2019  Glucose 70 - 99 mg/dL 112(H)  BUN 6 - 20 mg/dL 15  Creatinine 0.44 - 1.00 mg/dL 1.01(H)  Sodium 135 - 145 mmol/L 138  Potassium 3.5 - 5.1 mmol/L 4.4  Chloride 98 - 111 mmol/L 101  CO2 22 - 32 mmol/L 27  Calcium 8.9 - 10.3 mg/dL 9.3  Total Protein 6.5 - 8.1 g/dL 7.7  Total Bilirubin 0.3 - 1.2 mg/dL 0.5  Alkaline Phos 38 - 126 U/L 118  AST 15 - 41 U/L 42(H)  ALT 0 - 44 U/L 35   CBC Latest Ref Rng & Units 02/04/2020  WBC 4.0 - 10.5 K/uL 11.2(H)  Hemoglobin 12.0 - 15.0 g/dL 13.5  Hematocrit 36 - 46 % 41.6  Platelets 150 - 400 K/uL 329    Assessment and plan- Patient is a 59 y.o. female here for f/u of leucocytosis and iron deficiency anemia  1. Leucocytosis: mild. Waxes and wanes. Mainly neutrophilia. Probably reactive. Continue to monitor  2. Iron deficiency anemia: hb improved from 10 to 13 after feraheme. Iron studies are normal. Check cbc ferritin and iron studies in 3 and 6 months. I will see her in 6 months.   Visit Diagnosis 1. Other iron deficiency anemia   2. Neutrophilia      Dr. Randa Evens, MD, MPH [facility] [phone] 02/04/2020 12:28 PM

## 2020-02-08 DIAGNOSIS — Z7901 Long term (current) use of anticoagulants: Secondary | ICD-10-CM | POA: Diagnosis not present

## 2020-02-10 DIAGNOSIS — I350 Nonrheumatic aortic (valve) stenosis: Secondary | ICD-10-CM | POA: Diagnosis not present

## 2020-02-10 DIAGNOSIS — Z23 Encounter for immunization: Secondary | ICD-10-CM | POA: Diagnosis not present

## 2020-02-10 DIAGNOSIS — F32A Depression, unspecified: Secondary | ICD-10-CM | POA: Diagnosis not present

## 2020-02-10 DIAGNOSIS — I10 Essential (primary) hypertension: Secondary | ICD-10-CM | POA: Diagnosis not present

## 2020-02-10 DIAGNOSIS — E7801 Familial hypercholesterolemia: Secondary | ICD-10-CM | POA: Diagnosis not present

## 2020-02-10 DIAGNOSIS — E119 Type 2 diabetes mellitus without complications: Secondary | ICD-10-CM | POA: Diagnosis not present

## 2020-02-10 DIAGNOSIS — J4520 Mild intermittent asthma, uncomplicated: Secondary | ICD-10-CM | POA: Diagnosis not present

## 2020-02-10 DIAGNOSIS — H9193 Unspecified hearing loss, bilateral: Secondary | ICD-10-CM | POA: Diagnosis not present

## 2020-02-11 DIAGNOSIS — E119 Type 2 diabetes mellitus without complications: Secondary | ICD-10-CM | POA: Diagnosis not present

## 2020-02-11 DIAGNOSIS — Z7901 Long term (current) use of anticoagulants: Secondary | ICD-10-CM | POA: Diagnosis not present

## 2020-02-15 DIAGNOSIS — Z7901 Long term (current) use of anticoagulants: Secondary | ICD-10-CM | POA: Diagnosis not present

## 2020-02-16 DIAGNOSIS — E119 Type 2 diabetes mellitus without complications: Secondary | ICD-10-CM | POA: Diagnosis not present

## 2020-02-18 DIAGNOSIS — Z7901 Long term (current) use of anticoagulants: Secondary | ICD-10-CM | POA: Diagnosis not present

## 2020-02-25 DIAGNOSIS — Z7901 Long term (current) use of anticoagulants: Secondary | ICD-10-CM | POA: Diagnosis not present

## 2020-03-10 DIAGNOSIS — Z7901 Long term (current) use of anticoagulants: Secondary | ICD-10-CM | POA: Diagnosis not present

## 2020-03-14 DIAGNOSIS — Z7901 Long term (current) use of anticoagulants: Secondary | ICD-10-CM | POA: Diagnosis not present

## 2020-03-16 ENCOUNTER — Encounter: Payer: Self-pay | Admitting: *Deleted

## 2020-03-21 ENCOUNTER — Other Ambulatory Visit: Payer: Self-pay | Admitting: *Deleted

## 2020-03-21 DIAGNOSIS — Z87891 Personal history of nicotine dependence: Secondary | ICD-10-CM

## 2020-03-21 DIAGNOSIS — Z7901 Long term (current) use of anticoagulants: Secondary | ICD-10-CM | POA: Diagnosis not present

## 2020-03-21 DIAGNOSIS — Z122 Encounter for screening for malignant neoplasm of respiratory organs: Secondary | ICD-10-CM

## 2020-03-21 NOTE — Progress Notes (Signed)
Contacted and scheduled for lung screening scan. Patient is a former smoker, quit 02/13/18, 58.5 pack year history.

## 2020-03-28 ENCOUNTER — Ambulatory Visit: Payer: PPO

## 2020-04-04 ENCOUNTER — Other Ambulatory Visit: Payer: Self-pay

## 2020-04-04 DIAGNOSIS — I350 Nonrheumatic aortic (valve) stenosis: Secondary | ICD-10-CM | POA: Diagnosis not present

## 2020-04-04 DIAGNOSIS — Z7901 Long term (current) use of anticoagulants: Secondary | ICD-10-CM | POA: Diagnosis not present

## 2020-04-07 ENCOUNTER — Other Ambulatory Visit: Payer: Self-pay

## 2020-04-07 ENCOUNTER — Encounter: Payer: Self-pay | Admitting: Gastroenterology

## 2020-04-07 ENCOUNTER — Ambulatory Visit (INDEPENDENT_AMBULATORY_CARE_PROVIDER_SITE_OTHER): Payer: PPO | Admitting: Gastroenterology

## 2020-04-07 VITALS — BP 121/89 | HR 89 | Temp 98.2°F | Ht 62.0 in | Wt 146.2 lb

## 2020-04-07 DIAGNOSIS — D509 Iron deficiency anemia, unspecified: Secondary | ICD-10-CM | POA: Diagnosis not present

## 2020-04-07 DIAGNOSIS — K760 Fatty (change of) liver, not elsewhere classified: Secondary | ICD-10-CM

## 2020-04-07 DIAGNOSIS — R7989 Other specified abnormal findings of blood chemistry: Secondary | ICD-10-CM

## 2020-04-07 NOTE — Patient Instructions (Addendum)
Your RUQ ultrasound is scheduled for 04/18/2020 arrived 8:30am for a 8:45am scan at [facility] center. Nothing to eat or drink after midnight. If you need to rescheduled you can call [phone] option 3 and then 2 Carbohydrate Counting for Diabetes Mellitus, Adult  Carbohydrate counting is a method of keeping track of how many carbohydrates you eat. Eating carbohydrates naturally increases the amount of sugar (glucose) in the blood. Counting how many carbohydrates you eat helps keep your blood glucose within normal limits, which helps you manage your diabetes (diabetes mellitus). It is important to know how many carbohydrates you can safely have in each meal. This is different for every person. A diet and nutrition specialist (registered dietitian) can help you make a meal plan and calculate how many carbohydrates you should have at each meal and snack. Carbohydrates are found in the following foods:  Grains, such as breads and cereals.  Dried beans and soy products.  Starchy vegetables, such as potatoes, peas, and corn.  Fruit and fruit juices.  Milk and yogurt.  Sweets and snack foods, such as cake, cookies, candy, chips, and soft drinks. How do I count carbohydrates? There are two ways to count carbohydrates in food. You can use either of the methods or a combination of both. Reading "Nutrition Facts" on packaged food The "Nutrition Facts" list is included on the labels of almost all packaged foods and beverages in the [place] It includes:  The serving size.  Information about nutrients in each serving, including the grams (g) of carbohydrate per serving. To use the "Nutrition Facts":  Decide how many servings you will have.  Multiply the number of servings by the number of carbohydrates per serving.  The resulting number is the total amount of carbohydrates that you will be having. Learning standard serving sizes of other foods When you eat carbohydrate foods that are not  packaged or do not include "Nutrition Facts" on the label, you need to measure the servings in order to count the amount of carbohydrates:  Measure the foods that you will eat with a food scale or measuring cup, if needed.  Decide how many standard-size servings you will eat.  Multiply the number of servings by 15. Most carbohydrate-rich foods have about 15 g of carbohydrates per serving. ? For example, if you eat 8 oz (170 g) of strawberries, you will have eaten 2 servings and 30 g of carbohydrates (2 servings x 15 g = 30 g).  For foods that have more than one food mixed, such as soups and casseroles, you must count the carbohydrates in each food that is included. The following list contains standard serving sizes of common carbohydrate-rich foods. Each of these servings has about 15 g of carbohydrates:   hamburger bun or  English muffin.   oz (15 mL) syrup.   oz (14 g) jelly.  1 slice of bread.  1 six-inch tortilla.  3 oz (85 g) cooked rice or pasta.  4 oz (113 g) cooked dried beans.  4 oz (113 g) starchy vegetable, such as peas, corn, or potatoes.  4 oz (113 g) hot cereal.  4 oz (113 g) mashed potatoes or  of a large baked potato.  4 oz (113 g) canned or frozen fruit.  4 oz (120 mL) fruit juice.  4-6 crackers.  6 chicken nuggets.  6 oz (170 g) unsweetened dry cereal.  6 oz (170 g) plain fat-free yogurt or yogurt sweetened with artificial sweeteners.  8 oz (240 mL) milk.  8 oz (170 g) fresh fruit or one small piece of fruit.  24 oz (680 g) popped popcorn. Example of carbohydrate counting Sample meal  3 oz (85 g) chicken breast.  6 oz (170 g) brown rice.  4 oz (113 g) corn.  8 oz (240 mL) milk.  8 oz (170 g) strawberries with sugar-free whipped topping. Carbohydrate calculation 1. Identify the foods that contain carbohydrates: ? Rice. ? Corn. ? Milk. ? Strawberries. 2. Calculate how many servings you have of each food: ? 2 servings rice. ? 1  serving corn. ? 1 serving milk. ? 1 serving strawberries. 3. Multiply each number of servings by 15 g: ? 2 servings rice x 15 g = 30 g. ? 1 serving corn x 15 g = 15 g. ? 1 serving milk x 15 g = 15 g. ? 1 serving strawberries x 15 g = 15 g. 4. Add together all of the amounts to find the total grams of carbohydrates eaten: ? 30 g + 15 g + 15 g + 15 g = 75 g of carbohydrates total. Summary  Carbohydrate counting is a method of keeping track of how many carbohydrates you eat.  Eating carbohydrates naturally increases the amount of sugar (glucose) in the blood.  Counting how many carbohydrates you eat helps keep your blood glucose within normal limits, which helps you manage your diabetes.  A diet and nutrition specialist (registered dietitian) can help you make a meal plan and calculate how many carbohydrates you should have at each meal and snack. This information is not intended to replace advice given to you by your health care provider. Make sure you discuss any questions you have with your health care provider. Document Revised: 11/01/2016 Document Reviewed: 09/21/2015 Elsevier Patient Education  2020 [place].  Fatty Liver Disease  Fatty liver disease occurs when too much fat has built up in your liver cells. Fatty liver disease is also called hepatic steatosis or steatohepatitis. The liver removes harmful substances from your bloodstream and produces fluids that your body needs. It also helps your body use and store energy from the food you eat. In many cases, fatty liver disease does not cause symptoms or problems. It is often diagnosed when tests are being done for other reasons. However, over time, fatty liver can cause inflammation that may lead to more serious liver problems, such as scarring of the liver (cirrhosis) and liver failure. Fatty liver is associated with insulin resistance, increased body fat, high blood pressure (hypertension), and high cholesterol. These are  features of metabolic syndrome and increase your risk for stroke, diabetes, and heart disease. What are the causes? This condition may be caused by:  Drinking too much alcohol.  Poor nutrition.  Obesity.  Cushing's syndrome.  Diabetes.  High cholesterol.  Certain drugs.  Poisons.  Some viral infections.  Pregnancy. What increases the risk? You are more likely to develop this condition if you:  Abuse alcohol.  Are overweight.  Have diabetes.  Have hepatitis.  Have a high triglyceride level.  Are pregnant. What are the signs or symptoms? Fatty liver disease often does not cause symptoms. If symptoms do develop, they can include:  Fatigue.  Weakness.  Weight loss.  Confusion.  Abdominal pain.  Nausea and vomiting.  Yellowing of your skin and the white parts of your eyes (jaundice).  Itchy skin. How is this diagnosed? This condition may be diagnosed by:  A physical exam and medical history.  Blood tests.  Imaging tests,  such as an ultrasound, CT scan, or MRI.  A liver biopsy. A small sample of liver tissue is removed using a needle. The sample is then looked at under a microscope. How is this treated? Fatty liver disease is often caused by other health conditions. Treatment for fatty liver may involve medicines and lifestyle changes to manage conditions such as:  Alcoholism.  High cholesterol.  Diabetes.  Being overweight or obese. Follow these instructions at home:   Do not drink alcohol. If you have trouble quitting, ask your health care provider how to safely quit with the help of medicine or a supervised program. This is important to keep your condition from getting worse.  Eat a healthy diet as told by your health care provider. Ask your health care provider about working with a diet and nutrition specialist (dietitian) to develop an eating plan.  Exercise regularly. This can help you lose weight and control your cholesterol and  diabetes. Talk to your health care provider about an exercise plan and which activities are best for you.  Take over-the-counter and prescription medicines only as told by your health care provider.  Keep all follow-up visits as told by your health care provider. This is important. Contact a health care provider if: You have trouble controlling your:  Blood sugar. This is especially important if you have diabetes.  Cholesterol.  Drinking of alcohol. Get help right away if:  You have abdominal pain.  You have jaundice.  You have nausea and vomiting.  You vomit blood or material that looks like coffee grounds.  You have stools that are black, tar-like, or bloody. Summary  Fatty liver disease develops when too much fat builds up in the cells of your liver.  Fatty liver disease often causes no symptoms or problems. However, over time, fatty liver can cause inflammation that may lead to more serious liver problems, such as scarring of the liver (cirrhosis).  You are more likely to develop this condition if you abuse alcohol, are pregnant, are overweight, have diabetes, have hepatitis, or have high triglyceride levels.  Contact your health care provider if you have trouble controlling your weight, blood sugar, cholesterol, or drinking of alcohol. This information is not intended to replace advice given to you by your health care provider. Make sure you discuss any questions you have with your health care provider. Document Revised: 03/22/2017 Document Reviewed: 01/16/2017 Elsevier Patient Education  2020 [place] and Cholesterol Restricted Eating Plan Getting too much fat and cholesterol in your diet may cause health problems. Choosing the right foods helps keep your fat and cholesterol at normal levels. This can keep you from getting certain diseases. Your doctor may recommend an eating plan that includes:  Total fat: ______% or less of total calories a  day.  Saturated fat: ______% or less of total calories a day.  Cholesterol: less than _________mg a day.  Fiber: ______g a day. What are tips for following this plan? Meal planning  At meals, divide your plate into four equal parts: ? Fill one-half of your plate with vegetables and green salads. ? Fill one-fourth of your plate with whole grains. ? Fill one-fourth of your plate with low-fat (lean) protein foods.  Eat fish that is high in omega-3 fats at least two times a week. This includes mackerel, tuna, sardines, and salmon.  Eat foods that are high in fiber, such as whole grains, beans, apples, broccoli, carrots, peas, and barley. General tips   Work with  your doctor to lose weight if you need to.  Avoid: ? Foods with added sugar. ? Fried foods. ? Foods with partially hydrogenated oils.  Limit alcohol intake to no more than 1 drink a day for nonpregnant women and 2 drinks a day for men. One drink equals 12 oz of beer, 5 oz of wine, or 1 oz of hard liquor. Reading food labels  Check food labels for: ? Trans fats. ? Partially hydrogenated oils. ? Saturated fat (g) in each serving. ? Cholesterol (mg) in each serving. ? Fiber (g) in each serving.  Choose foods with healthy fats, such as: ? Monounsaturated fats. ? Polyunsaturated fats. ? Omega-3 fats.  Choose grain products that have whole grains. Look for the word "whole" as the first word in the ingredient list. Cooking  Cook foods using low-fat methods. These include baking, boiling, grilling, and broiling.  Eat more home-cooked foods. Eat at restaurants and buffets less often.  Avoid cooking using saturated fats, such as butter, cream, palm oil, palm kernel oil, and coconut oil. Recommended foods  Fruits  All fresh, canned (in natural juice), or frozen fruits. Vegetables  Fresh or frozen vegetables (raw, steamed, roasted, or grilled). Green salads. Grains  Whole grains, such as whole wheat or whole  grain breads, crackers, cereals, and pasta. Unsweetened oatmeal, bulgur, barley, quinoa, or brown rice. Corn or whole wheat flour tortillas. Meats and other protein foods  Ground beef (85% or leaner), grass-fed beef, or beef trimmed of fat. Skinless chicken or [place]. Ground chicken or [place]. Pork trimmed of fat. All fish and seafood. Egg whites. Dried beans, peas, or lentils. Unsalted nuts or seeds. Unsalted canned beans. Nut butters without added sugar or oil. Dairy  Low-fat or nonfat dairy products, such as skim or 1% milk, 2% or reduced-fat cheeses, low-fat and fat-free ricotta or cottage cheese, or plain low-fat and nonfat yogurt. Fats and oils  Tub margarine without trans fats. Light or reduced-fat mayonnaise and salad dressings. Avocado. Olive, canola, sesame, or safflower oils. The items listed above may not be a complete list of foods and beverages you can eat. Contact a dietitian for more information. Foods to avoid Fruits  Canned fruit in heavy syrup. Fruit in cream or butter sauce. Fried fruit. Vegetables  Vegetables cooked in cheese, cream, or butter sauce. Fried vegetables. Grains  White bread. White pasta. White rice. Cornbread. Bagels, pastries, and croissants. Crackers and snack foods that contain trans fat and hydrogenated oils. Meats and other protein foods  Fatty cuts of meat. Ribs, chicken wings, bacon, sausage, bologna, salami, chitterlings, fatback, hot dogs, bratwurst, and packaged lunch meats. Liver and organ meats. Whole eggs and egg yolks. Chicken and [place] with skin. Fried meat. Dairy  Whole or 2% milk, cream, half-and-half, and cream cheese. Whole milk cheeses. Whole-fat or sweetened yogurt. Full-fat cheeses. Nondairy creamers and whipped toppings. Processed cheese, cheese spreads, and cheese curds. Beverages  Alcohol. Sugar-sweetened drinks such as sodas, lemonade, and fruit drinks. Fats and oils  Butter, stick margarine, lard, shortening, ghee, or  bacon fat. Coconut, palm kernel, and palm oils. Sweets and desserts  Corn syrup, sugars, honey, and molasses. Candy. Jam and jelly. Syrup. Sweetened cereals. Cookies, pies, cakes, donuts, muffins, and ice cream. The items listed above may not be a complete list of foods and beverages you should avoid. Contact a dietitian for more information. Summary  Choosing the right foods helps keep your fat and cholesterol at normal levels. This can keep you from getting certain diseases.  At meals, fill one-half of your plate with vegetables and green salads.  Eat high-fiber foods, like whole grains, beans, apples, carrots, peas, and barley.  Limit added sugar, saturated fats, alcohol, and fried foods. This information is not intended to replace advice given to you by your health care provider. Make sure you discuss any questions you have with your health care provider. Document Revised: 12/11/2017 Document Reviewed: 12/25/2016 Elsevier Patient Education  [place].

## 2020-04-07 NOTE — Progress Notes (Signed)
Cephas Darby, MD [address]  [place]  [place], [place] [address]  Main: [phone]  Fax: [phone]    Gastroenterology Consultation  Referring Provider:     [name], MD Primary Care Physician:  [name], MD Primary Gastroenterologist:  Dr. Cephas Darby Reason for Consultation:     Iron deficiency anemia        HPI:   Brianna Harris is a 59 y.o. female referred by Dr. Edwina Barth, Chrystie Nose, MD  for consultation & management of iron deficiency anemia.  Patient is chronic iron deficiency anemia of unclear etiology.  Patient has history of aortic valve stenosis s/p AVR on 02/14/2018 at [facility], has been maintained on Coumadin since then, s/p pacemaker due to high-grade heart block perioperatively.  Review of records revealed that she has anemia for last 2 years.  Lowest hemoglobin was 9.5, does have severe iron deficiency, serum ferritin 6 in 11/2019.  Patient has seen Dr. Janese Banks for this, received 2 iron infusions.  Most recent hemoglobin was 10.1, MCV 69.4 in 11/2019.  Patient reports that her energy levels are improving.  She continues to have craving for ice.  She quit smoking since her valve replacement.  Patient is accompanied by an interpreter as well as her mom.  Patient denies any GI symptoms today.  She reports having 1-2 soft bowel movements which is normal for her.  She denies any rectal bleeding  Follow-up visit 04/07/2020 Patient is with no complaints today.  She underwent iron infusions and her iron deficiency anemia has resolved.  Her iron levels are at goal.  She underwent upper endoscopy as well as colonoscopy, upper endoscopy revealed focal intestinal metaplasia only and colonoscopy revealed nonbleeding medium-sized AVM in the cecum which was treated with APC.  No evidence of H. pylori.  Patient has elevated LFTs AST, ALT and alkaline phosphatase, 1-2 times upper limit of normal since July 2021.  She does have mildly elevated hemoglobin A1c,  being treated for diabetes with Metformin.  Patient eats out at restaurants almost on a daily basis, her diet consists of mostly high-calorie foods  NSAIDs: None  Antiplts/Anticoagulants/Anti thrombotics: Coumadin for history of AVR  GI Procedures:   EGD and colonoscopy 02/02/2020 - Normal examined duodenum. Biopsied. - Erythematous mucosa in the gastric body and antrum. Biopsied. - Esophagogastric landmarks identified. - Normal gastroesophageal junction and esophagus.  - The examined portion of the ileum was normal. - Diverticulosis in the sigmoid colon. - A single non-bleeding colonic angioectasia. Treated with argon plasma coagulation (APC). - The distal rectum and anal verge are normal on retroflexion view. - No specimens collected.  DIAGNOSIS:  A. DUODENUM; COLD BIOPSY:  - ENTERIC MUCOSA WITH PRESERVED VILLOUS ARCHITECTURE AND NO SIGNIFICANT  HISTOPATHOLOGIC CHANGE.  - NEGATIVE FOR FEATURES OF CELIAC, DYSPLASIA, AND MALIGNANCY.   B. STOMACH; COLD BIOPSY:  - GASTRIC ANTRAL AND OXYNTIC MUCOSA WITH CHRONIC GASTRITIS WITH FOCAL  ACTIVITY.  - INTESTINAL METAPLASIA IS PRESENT.  - NEGATIVE FOR H. PYLORI, DYSPLASIA, AND MALIGNANCY.   Comment:  Due to the presence of focal active inflammation, an immunohistochemical  study directed against H. pylori was performed and is negative.    EGD and colonoscopy 08/17/2016 - Normal esophagus. - Small hiatal hernia. - Gastritis. Biopsied. - Normal examined duodenum.  - One diminutive polyp in the rectum, removed with a jumbo cold forceps. Resected and retrieved. - One 10 mm polyp in the sigmoid colon, removed with a hot snare. Resected  and retrieved. - Internal hemorrhoids. - The examination was otherwise normal.  DIAGNOSIS:  A. STOMACH, ANTRUM AND BODY; COLD BIOPSY:  - OXYNTIC MUCOSA WITH MODERATE CHRONIC GASTRITIS AND FOCAL INTESTINAL  METAPLASIA.  - IMMUNOHISTOCHEMICAL STAIN FOR H. PYLORI WILL BE REPORTED IN AN  ADDENDUM.  -  NEGATIVE FOR DYSPLASIA AND MALIGNANCY.   B. RECTUM POLYP; COLD BIOPSY:  - HYPERPLASTIC POLYP.  - NEGATIVE FOR DYSPLASIA AND MALIGNANCY.   C. COLON POLYP, SIGMOID; HOT SNARE:  - HYPERPLASTIC POLYP.  - NEGATIVE FOR DYSPLASIA AND MALIGNANCY.   ADDENDUM:  An immunohistochemical stain for H. pylori was performed and is  negative. Stain controls worked appropriately.   Past Medical History:  Diagnosis Date  . Asthma   . Deafness   . Depression   . Diabetes mellitus without complication ([place])   . GERD (gastroesophageal reflux disease)   . Heart abnormality    per mother pt has bad heart murmor- sees Dr Ubaldo Glassing   . Hyperlipidemia   . Hypertension     Past Surgical History:  Procedure Laterality Date  . ABDOMINAL HYSTERECTOMY  2000   per mother [facility] thought pt cancer and did hyster ,cervix and ovaries  . CHOLECYSTECTOMY    . COLONOSCOPY WITH PROPOFOL N/A 08/17/2016   Procedure: COLONOSCOPY WITH PROPOFOL;  Surgeon: Manya Silvas, MD;  Location[facility] ENDOSCOPY;  Service: Endoscopy;  Laterality: N/A;  . COLONOSCOPY WITH PROPOFOL N/A 02/02/2020   Procedure: COLONOSCOPY WITH PROPOFOL;  Surgeon: [name], MD;  Location: [facility] ENDOSCOPY;  Service: Gastroenterology;  Laterality: N/A;  . ESOPHAGOGASTRODUODENOSCOPY (EGD) WITH PROPOFOL N/A 08/17/2016   Procedure: ESOPHAGOGASTRODUODENOSCOPY (EGD) WITH PROPOFOL;  Surgeon: Manya Silvas, MD;  [facility] ENDOSCOPY;  Service: Endoscopy;  Laterality: N/A;  . ESOPHAGOGASTRODUODENOSCOPY (EGD) WITH PROPOFOL  02/02/2020   Procedure: ESOPHAGOGASTRODUODENOSCOPY (EGD) WITH PROPOFOL;  Surgeon: [name], MD;  Location: [facility] ENDOSCOPY;  Service: Gastroenterology;;  . PACEMAKER IMPLANT  01/2018    Current Outpatient Medications:  .  amitriptyline (ELAVIL) 25 MG tablet, Take 25 mg by mouth at bedtime. , Disp: , Rfl:  .  amLODipine (NORVASC) 5 MG tablet, Take 2.5 mg by mouth in the morning and at bedtime. , Disp: , Rfl:  .   atorvastatin (LIPITOR) 40 MG tablet, Take 40 mg by mouth daily. , Disp: , Rfl:  .  Blood Glucose Monitoring Suppl (ONE TOUCH ULTRA 2) w/Device KIT, See admin instructions., Disp: , Rfl:  .  FLUoxetine (PROZAC) 20 MG capsule, Take 20 mg by mouth at bedtime. , Disp: , Rfl:  .  lamoTRIgine (LAMICTAL) 100 MG tablet, Take 50 mg by mouth 2 (two) times daily. , Disp: , Rfl:  .  Lancets (ONETOUCH DELICA PLUS [id number]) MISC, , Disp: , Rfl:  .  metFORMIN (GLUCOPHAGE-XR) 500 MG 24 hr tablet, Take 1,000 mg by mouth in the morning and at bedtime. , Disp: , Rfl:  .  metoprolol tartrate (LOPRESSOR) 25 MG tablet, Take 25 mg by mouth 2 (two) times daily. , Disp: , Rfl:  .  omeprazole (PRILOSEC) 40 MG capsule, Take 40 mg by mouth daily., Disp: , Rfl:  .  ONE TOUCH ULTRA TEST test strip, , Disp: , Rfl:  .  OneTouch Delica Lancets 71G MISC, Use once daily to check blood sugars dx e11.9, Disp: , Rfl:  .  warfarin (COUMADIN) 1 MG tablet, Take 2.5 mg by mouth one time only at 4 PM. , Disp: , Rfl:    Family History  Problem Relation Age  of Onset  . Diabetes Maternal Uncle   . Heart attack Father   . Kidney cancer Paternal Uncle   . Breast cancer Neg Hx   . Bladder Cancer Neg Hx      Social History   Tobacco Use  . Smoking status: Former Smoker    Packs/day: 1.50    Years: 39.00    Pack years: 58.50    Types: Cigarettes    Quit date: 02/13/2018    Years since quitting: 2.1  . Smokeless tobacco: Never Used  Vaping Use  . Vaping Use: Former  Substance Use Topics  . Alcohol use: Not Currently  . Drug use: Yes    Types: Marijuana, "Crack" cocaine    Comment: smoked 3 yr ago,for 2 y. Hx of cocaine use     Allergies as of 04/07/2020  . (No Known Allergies)    Review of Systems:    All systems reviewed and negative except where noted in HPI.   Physical Exam:  BP 121/89 (BP Location: Left Arm, Patient Position: Sitting, Cuff Size: Normal)   Pulse 89   Temp 98.2 F (36.8 C) (Oral)   Ht '5\' 2"'   (1.575 m)   Wt 146 lb 4 oz (66.3 kg)   BMI 26.75 kg/m  No LMP recorded. Patient has had a hysterectomy.  General:   Alert,  Well-developed, well-nourished, pleasant and cooperative in NAD Head:  Normocephalic and atraumatic. Eyes:  Sclera clear, no icterus.   Conjunctiva pink. Ears:  Normal auditory acuity. Nose:  No deformity, discharge, or lesions. Mouth:  No deformity or lesions,oropharynx pink & moist. Neck:  Supple; no masses or thyromegaly. Lungs:  Respirations even and unlabored.  Clear throughout to auscultation.   No wheezes, crackles, or rhonchi. No acute distress. Heart:  Regular rate and rhythm; no murmurs, clicks, rubs, or gallops. Abdomen:  Normal bowel sounds. Soft, non-tender and non-distended without masses, hepatosplenomegaly or hernias noted.  No guarding or rebound tenderness.   Rectal: Not performed Msk:  Symmetrical without gross deformities. Good, equal movement & strength bilaterally. Pulses:  Normal pulses noted. Extremities:  No clubbing or edema.  No cyanosis. Neurologic:  Alert and oriented x3;  grossly normal neurologically. Skin:  Intact without significant lesions or rashes. No jaundice. Psych:  Alert and cooperative. Normal mood and affect.  Imaging Studies: Fatty liver based on CT in 11/2013  Assessment and Plan:   MINKA KNIGHT is a 59 y.o. female with metabolic syndrome, bicuspid aortic valve s/p valve replacement in October 2019, s/p pacemaker on Coumadin is seen in for chronic iron deficiency anemia and elevated LFTs  Chronic iron deficiency anemia likely secondary to chronic blood loss from large bowel AVM Upper endoscopy revealed focal intestinal metaplasia, no evidence of H. pylori.  No evidence of celiac disease.  Colonoscopy revealed AVM in the cecum which was treated with APC.  Since, iron deficiency anemia has resolved, will defer video capsule endoscopy at this time.  We will recheck iron studies today  Elevated LFTs Recommend right  upper quadrant ultrasound Recommend secondary liver disease work-up Discussed about lifestyle modification   Follow up in 2-3 months   Cephas Darby, MD

## 2020-04-08 ENCOUNTER — Encounter: Payer: Self-pay | Admitting: Gastroenterology

## 2020-04-08 LAB — HEPATIC FUNCTION PANEL
ALT: 98 IU/L — ABNORMAL HIGH (ref 0–32)
AST: 92 IU/L — ABNORMAL HIGH (ref 0–40)
Albumin: 4.4 g/dL (ref 3.8–4.9)
Alkaline Phosphatase: 135 IU/L — ABNORMAL HIGH (ref 44–121)
Bilirubin Total: 0.7 mg/dL (ref 0.0–1.2)
Bilirubin, Direct: 0.17 mg/dL (ref 0.00–0.40)
Total Protein: 7 g/dL (ref 6.0–8.5)

## 2020-04-08 LAB — HEPATITIS PANEL, ACUTE
Hep A IgM: NEGATIVE
Hep B C IgM: NEGATIVE
Hep C Virus Ab: 1.4 s/co ratio — ABNORMAL HIGH (ref 0.0–0.9)
Hepatitis B Surface Ag: NEGATIVE

## 2020-04-08 LAB — ANA: Anti Nuclear Antibody (ANA): POSITIVE — AB

## 2020-04-08 LAB — IRON,TIBC AND FERRITIN PANEL
Ferritin: 228 ng/mL — ABNORMAL HIGH (ref 15–150)
Iron Saturation: 19 % (ref 15–55)
Iron: 54 ug/dL (ref 27–159)
Total Iron Binding Capacity: 287 ug/dL (ref 250–450)
UIBC: 233 ug/dL (ref 131–425)

## 2020-04-08 LAB — ANTI-MICROSOMAL ANTIBODY LIVER / KIDNEY: LKM1 Ab: 1.5 Units (ref 0.0–20.0)

## 2020-04-08 LAB — ANTI-SMOOTH MUSCLE ANTIBODY, IGG: Smooth Muscle Ab: 5 Units (ref 0–19)

## 2020-04-08 LAB — ALPHA-1-ANTITRYPSIN: A-1 Antitrypsin: 158 mg/dL (ref 101–187)

## 2020-04-11 ENCOUNTER — Telehealth: Payer: Self-pay

## 2020-04-11 DIAGNOSIS — R7989 Other specified abnormal findings of blood chemistry: Secondary | ICD-10-CM | POA: Diagnosis not present

## 2020-04-11 DIAGNOSIS — K760 Fatty (change of) liver, not elsewhere classified: Secondary | ICD-10-CM

## 2020-04-11 DIAGNOSIS — R768 Other specified abnormal immunological findings in serum: Secondary | ICD-10-CM

## 2020-04-11 LAB — MITOCHONDRIAL/SMOOTH MUSCLE AB PNL
Mitochondrial Ab: <20 Units (ref 0.0–20.0)
Smooth Muscle Ab: 6 Units (ref 0–19)

## 2020-04-11 LAB — CERULOPLASMIN: Ceruloplasmin: 31.2 mg/dL (ref 19.0–39.0)

## 2020-04-11 NOTE — Telephone Encounter (Signed)
-----   Message from [name], MD sent at 04/11/2020  8:54 AM EST ----- LFTs are elevated and worse.  ANA is positive which is an autoimmune marker.  As well as, hepatitis C virus antibody is positive, therefore recommend to check HCVRNA with genotype to check whether or not she has chronic hepatitis C infection which needs to be treated.   She might need a liver biopsy based on the above tests and as well as ultrasound results.  I will see her for follow-up as scheduled.  Avoid all the fatty foods, greasy foods and sugars  Rohini Vanga

## 2020-04-11 NOTE — Progress Notes (Signed)
LFTs are elevated and worse.  ANA is positive which is an autoimmune marker.  As well as, hepatitis C virus antibody is positive, therefore recommend to check HCVRNA with genotype to check whether or not she has chronic hepatitis C infection which needs to be treated.   She might need a liver biopsy based on the above tests and as well as ultrasound results.  I will see her for follow-up as scheduled.  Avoid all the fatty foods, greasy foods and sugars  Brianna Harris

## 2020-04-11 NOTE — Telephone Encounter (Signed)
-----   Message from [name], MD sent at 04/11/2020  8:54 AM EST ----- LFTs are elevated and worse.  ANA is positive which is an autoimmune marker.  As well as, hepatitis C virus antibody is positive, therefore recommend to check HCVRNA with genotype to check whether or not she has chronic hepatitis C infection which needs to be treated.   She might need a liver biopsy based on the above tests and as well as ultrasound results.  I will see her for follow-up as scheduled.  Avoid all the fatty foods, greasy foods and sugars  Brianna Harris

## 2020-04-11 NOTE — Telephone Encounter (Signed)
Patient mother verbalized understanding of results. She will go for lab work today or tomorrow to have the labs done

## 2020-04-12 ENCOUNTER — Ambulatory Visit: Payer: PPO | Admitting: Gastroenterology

## 2020-04-12 DIAGNOSIS — I10 Essential (primary) hypertension: Secondary | ICD-10-CM | POA: Diagnosis not present

## 2020-04-12 DIAGNOSIS — Z952 Presence of prosthetic heart valve: Secondary | ICD-10-CM | POA: Diagnosis not present

## 2020-04-12 DIAGNOSIS — I350 Nonrheumatic aortic (valve) stenosis: Secondary | ICD-10-CM | POA: Diagnosis not present

## 2020-04-12 DIAGNOSIS — Z950 Presence of cardiac pacemaker: Secondary | ICD-10-CM | POA: Diagnosis not present

## 2020-04-12 DIAGNOSIS — I442 Atrioventricular block, complete: Secondary | ICD-10-CM | POA: Diagnosis not present

## 2020-04-12 DIAGNOSIS — I700 Atherosclerosis of aorta: Secondary | ICD-10-CM | POA: Diagnosis not present

## 2020-04-12 DIAGNOSIS — I9789 Other postprocedural complications and disorders of the circulatory system, not elsewhere classified: Secondary | ICD-10-CM | POA: Diagnosis not present

## 2020-04-12 DIAGNOSIS — R109 Unspecified abdominal pain: Secondary | ICD-10-CM | POA: Diagnosis not present

## 2020-04-12 DIAGNOSIS — R001 Bradycardia, unspecified: Secondary | ICD-10-CM | POA: Diagnosis not present

## 2020-04-13 ENCOUNTER — Ambulatory Visit: Payer: PPO

## 2020-04-13 LAB — HCV RNA QUANT RFLX ULTRA OR GENOTYP: HCV Quant Baseline: NOT DETECTED IU/mL

## 2020-04-18 ENCOUNTER — Telehealth: Payer: Self-pay | Admitting: Gastroenterology

## 2020-04-18 ENCOUNTER — Telehealth: Payer: Self-pay

## 2020-04-18 ENCOUNTER — Encounter: Payer: Self-pay | Admitting: Gastroenterology

## 2020-04-18 ENCOUNTER — Ambulatory Visit
Admission: RE | Admit: 2020-04-18 | Discharge: 2020-04-18 | Disposition: A | Discharge status: Home / Self Care | Payer: PPO | Source: Ambulatory Visit | Attending: Gastroenterology | Admitting: Gastroenterology

## 2020-04-18 ENCOUNTER — Other Ambulatory Visit: Payer: Self-pay

## 2020-04-18 DIAGNOSIS — R7989 Other specified abnormal findings of blood chemistry: Secondary | ICD-10-CM | POA: Diagnosis not present

## 2020-04-18 DIAGNOSIS — K760 Fatty (change of) liver, not elsewhere classified: Secondary | ICD-10-CM | POA: Diagnosis not present

## 2020-04-18 NOTE — Telephone Encounter (Signed)
Brianna Harris and her mother stopped by the [facility] office for lab results that she had last week. I told her that you were not in the [facility] office today and that you would received a phone call. Patient is deaf and you will need to discuss results with the mother.

## 2020-04-18 NOTE — Telephone Encounter (Signed)
Pt notified of [place] and lab results. Pt has been added to recall for repeat labs.

## 2020-04-18 NOTE — Telephone Encounter (Signed)
-----   Message from [name], MD sent at 04/18/2020 10:53 AM EST ----- US shows fatty liver only She does not have hepatitis C virus in her blood, which means she cleared it by herself and no need to be treated. She should follow strict low fat diet and will repeat liver test in 1 month, if still high, recommend liver biopsy  RV

## 2020-04-19 NOTE — Telephone Encounter (Signed)
Called and left a message for call back. Patient seen mychart message

## 2020-04-25 DIAGNOSIS — Z7901 Long term (current) use of anticoagulants: Secondary | ICD-10-CM | POA: Diagnosis not present

## 2020-05-03 ENCOUNTER — Other Ambulatory Visit: Payer: Self-pay

## 2020-05-03 DIAGNOSIS — E119 Type 2 diabetes mellitus without complications: Secondary | ICD-10-CM

## 2020-05-03 DIAGNOSIS — D508 Other iron deficiency anemias: Secondary | ICD-10-CM

## 2020-05-05 ENCOUNTER — Other Ambulatory Visit: Payer: Self-pay

## 2020-05-05 ENCOUNTER — Inpatient Hospital Stay: Payer: PPO | Attending: Oncology

## 2020-05-05 DIAGNOSIS — F329 Major depressive disorder, single episode, unspecified: Secondary | ICD-10-CM | POA: Insufficient documentation

## 2020-05-05 DIAGNOSIS — E119 Type 2 diabetes mellitus without complications: Secondary | ICD-10-CM | POA: Diagnosis not present

## 2020-05-05 DIAGNOSIS — Z87891 Personal history of nicotine dependence: Secondary | ICD-10-CM | POA: Insufficient documentation

## 2020-05-05 DIAGNOSIS — Z7901 Long term (current) use of anticoagulants: Secondary | ICD-10-CM | POA: Insufficient documentation

## 2020-05-05 DIAGNOSIS — Z7984 Long term (current) use of oral hypoglycemic drugs: Secondary | ICD-10-CM | POA: Diagnosis not present

## 2020-05-05 DIAGNOSIS — I10 Essential (primary) hypertension: Secondary | ICD-10-CM | POA: Insufficient documentation

## 2020-05-05 DIAGNOSIS — E785 Hyperlipidemia, unspecified: Secondary | ICD-10-CM | POA: Diagnosis not present

## 2020-05-05 DIAGNOSIS — J45909 Unspecified asthma, uncomplicated: Secondary | ICD-10-CM | POA: Insufficient documentation

## 2020-05-05 DIAGNOSIS — Z79899 Other long term (current) drug therapy: Secondary | ICD-10-CM | POA: Insufficient documentation

## 2020-05-05 DIAGNOSIS — K219 Gastro-esophageal reflux disease without esophagitis: Secondary | ICD-10-CM | POA: Insufficient documentation

## 2020-05-05 DIAGNOSIS — D509 Iron deficiency anemia, unspecified: Secondary | ICD-10-CM | POA: Insufficient documentation

## 2020-05-05 LAB — COMPREHENSIVE METABOLIC PANEL
ALT: 82 U/L — ABNORMAL HIGH (ref 0–44)
AST: 73 U/L — ABNORMAL HIGH (ref 15–41)
Albumin: 4.1 g/dL (ref 3.5–5.0)
Alkaline Phosphatase: 93 U/L (ref 38–126)
Anion gap: 11 (ref 5–15)
BUN: 19 mg/dL (ref 6–20)
CO2: 28 mmol/L (ref 22–32)
Calcium: 9.4 mg/dL (ref 8.9–10.3)
Chloride: 97 mmol/L — ABNORMAL LOW (ref 98–111)
Creatinine, Ser: 0.84 mg/dL (ref 0.44–1.00)
GFR, Estimated: >60 mL/min (ref >60)
Glucose, Bld: 192 mg/dL — ABNORMAL HIGH (ref 70–99)
Potassium: 4.4 mmol/L (ref 3.5–5.1)
Sodium: 136 mmol/L (ref 135–145)
Total Bilirubin: 0.9 mg/dL (ref 0.3–1.2)
Total Protein: 7.5 g/dL (ref 6.5–8.1)

## 2020-05-05 LAB — HEMOGLOBIN A1C
Hgb A1c MFr Bld: 7.2 % — ABNORMAL HIGH (ref 4.8–5.6)
Mean Plasma Glucose: 159.94 mg/dL

## 2020-05-06 DIAGNOSIS — E119 Type 2 diabetes mellitus without complications: Secondary | ICD-10-CM | POA: Diagnosis not present

## 2020-05-06 DIAGNOSIS — Z7901 Long term (current) use of anticoagulants: Secondary | ICD-10-CM | POA: Diagnosis not present

## 2020-05-19 ENCOUNTER — Telehealth: Payer: Self-pay

## 2020-05-19 DIAGNOSIS — Z952 Presence of prosthetic heart valve: Secondary | ICD-10-CM | POA: Diagnosis not present

## 2020-05-19 DIAGNOSIS — H9193 Unspecified hearing loss, bilateral: Secondary | ICD-10-CM | POA: Diagnosis not present

## 2020-05-19 DIAGNOSIS — J4520 Mild intermittent asthma, uncomplicated: Secondary | ICD-10-CM | POA: Diagnosis not present

## 2020-05-19 DIAGNOSIS — K760 Fatty (change of) liver, not elsewhere classified: Secondary | ICD-10-CM | POA: Diagnosis not present

## 2020-05-19 DIAGNOSIS — E7801 Familial hypercholesterolemia: Secondary | ICD-10-CM | POA: Diagnosis not present

## 2020-05-19 DIAGNOSIS — D509 Iron deficiency anemia, unspecified: Secondary | ICD-10-CM | POA: Diagnosis not present

## 2020-05-19 DIAGNOSIS — F32A Depression, unspecified: Secondary | ICD-10-CM | POA: Diagnosis not present

## 2020-05-19 DIAGNOSIS — R7989 Other specified abnormal findings of blood chemistry: Secondary | ICD-10-CM

## 2020-05-19 DIAGNOSIS — E119 Type 2 diabetes mellitus without complications: Secondary | ICD-10-CM | POA: Diagnosis not present

## 2020-05-19 DIAGNOSIS — I10 Essential (primary) hypertension: Secondary | ICD-10-CM | POA: Diagnosis not present

## 2020-05-19 NOTE — Telephone Encounter (Signed)
Order lab work and  [employer] informing patient

## 2020-05-19 NOTE — Telephone Encounter (Signed)
-----   Message from [place], [place] sent at 04/18/2020  1:38 PM EST ----- Pt needs repeat LFT's per Dr. Verlin Grills message dated 04/18/20. You were on vacation when I gave the pt the results.

## 2020-05-25 ENCOUNTER — Other Ambulatory Visit: Payer: Self-pay

## 2020-05-25 ENCOUNTER — Ambulatory Visit
Admission: RE | Admit: 2020-05-25 | Discharge: 2020-05-25 | Disposition: A | Discharge status: Home / Self Care | Payer: PPO | Source: Ambulatory Visit | Attending: Oncology | Admitting: Oncology

## 2020-05-25 DIAGNOSIS — Z122 Encounter for screening for malignant neoplasm of respiratory organs: Secondary | ICD-10-CM | POA: Diagnosis not present

## 2020-05-25 DIAGNOSIS — Z87891 Personal history of nicotine dependence: Secondary | ICD-10-CM | POA: Insufficient documentation

## 2020-05-27 ENCOUNTER — Encounter: Payer: Self-pay | Admitting: *Deleted

## 2020-05-31 DIAGNOSIS — K5520 Angiodysplasia of colon without hemorrhage: Secondary | ICD-10-CM | POA: Diagnosis not present

## 2020-05-31 DIAGNOSIS — H9193 Unspecified hearing loss, bilateral: Secondary | ICD-10-CM | POA: Diagnosis not present

## 2020-06-09 ENCOUNTER — Ambulatory Visit (INDEPENDENT_AMBULATORY_CARE_PROVIDER_SITE_OTHER): Payer: PPO | Admitting: Gastroenterology

## 2020-06-09 ENCOUNTER — Other Ambulatory Visit: Payer: Self-pay

## 2020-06-09 ENCOUNTER — Encounter: Payer: Self-pay | Admitting: Gastroenterology

## 2020-06-09 VITALS — BP 112/69 | HR 83 | Temp 97.6°F | Ht 62.0 in | Wt 146.1 lb

## 2020-06-09 DIAGNOSIS — K760 Fatty (change of) liver, not elsewhere classified: Secondary | ICD-10-CM

## 2020-06-09 DIAGNOSIS — E0800 Diabetes mellitus due to underlying condition with hyperosmolarity without nonketotic hyperglycemic-hyperosmolar coma (NKHHC): Secondary | ICD-10-CM | POA: Diagnosis not present

## 2020-06-09 DIAGNOSIS — Z7901 Long term (current) use of anticoagulants: Secondary | ICD-10-CM | POA: Diagnosis not present

## 2020-06-09 NOTE — Patient Instructions (Signed)
Vitamin K Foods and Warfarin Warfarin is a blood thinner (anticoagulant). Anticoagulant medicines help prevent the formation of blood clots. Warfarin works by blocking the activity of vitamin K, which promotes normal blood clotting. When you take warfarin, problems can occur from suddenly increasing or decreasing the amount of vitamin K that you eat from one day to the next. These problems can occur due to varying levels of warfarin in your blood. Problems may include:  Blood clots.  Bleeding. What are tips for eating the right amount of vitamin K? To avoid problems when taking warfarin:  Eat a balanced diet that includes: ? Fresh fruits and vegetables. ? Whole grains. ? Low-fat dairy products. ? Lean proteins, such as fish, eggs, and lean cuts of meat.  Keep your intake of vitamin K consistent from day to day. To do this: ? Avoid eating large amounts of vitamin K one day and low amounts of vitamin K the next day. ? If you take a multivitamin that contains vitamin K, be sure to take it every day. ? Know which foods contain vitamin K. Read food labels. Use the lists below to understand serving sizes and the amount of vitamin K in one serving.  Avoid major changes in your diet. If you are going to change your diet, talk with your health care provider before making changes.  Work with a nutrition specialist (dietitian) to develop a meal plan that works best for you.   What foods are high in vitamin K? Foods that are high in vitamin K contain more than 100 mcg (micrograms) per serving. These include:  Broccoli (cooked from fresh) -  cup (78 g) has 110 mcg.  Brussels sprouts (cooked from fresh) -  cup (78 g) has 109 mcg.  Greens, beet (cooked from fresh) -  cup (72 g) has 350 mcg.  Greens, collard (cooked from fresh) -  cup (66 g) has 263 mcg.  Greens, turnip (cooked from fresh) -  cup (72 g) has 265 mcg.  Green onions or scallions -  cup (50 g) has 105 mcg.  Kale (cooked from  fresh) -  cup (68 g) has 536 mcg.  Parsley (raw) - 10 sprigs (10 g) has 164 mcg.  Spinach (cooked from fresh) -  cup (90 g) has 444 mcg.  Swiss chard (cooked from fresh) -  cup (88 g) has 287 mcg.   What foods have a moderate amount of vitamin K? Foods that have a moderate amount of vitamin K contain 25-100 mcg per serving. These include:  Asparagus (cooked from fresh) - 4 spears (60 g) have 30 mcg.  Black-eyed peas (dried) -  cup (85 g) has 32 mcg.  Cabbage (cooked from fresh) -  cup (78 g) has 84 mcg.  Cabbage (raw) -  cup (35 g) has 26 mcg.  Kiwi fruit - 1 medium (69 g) has 27 mcg.  Lettuce (raw) - 1 cup (36 g) has 45 mcg.  Okra (cooked from fresh) -  cup (80 g) has 32 mcg.  Prunes (dried) - 5 prunes (47 g) have 25 mcg.  Watercress (raw) - 1 cup (34 g) has 85 mcg. What foods are low in vitamin K? Foods low in vitamin K contain less than 25 mcg per serving. These include:  Artichoke - 1 medium (128 g) has 18 mcg.  Avocado - 1 oz (21 g) has 6 mcg.  Blueberries -  cup (73 g) has 14 mcg.  Carrots (cooked from fresh) -  cup (  78 g) has 11 mcg.  Cauliflower (raw) -  cup (54 g) has 8 mcg.  Cucumber with peel (raw) -  cup (52 g) has 9 mcg.  Grapes -  cup (76 g) has 12 mcg.  Mango - 1 medium (207 g) has 9 mcg.  Mixed nuts - 1 cup (142 g) has 17 mcg.  Pear - 1 medium (178 g) has 8 mcg.  Peas (cooked from fresh) -  cup (80 g) has 20 mcg.  Pickled cucumber - 1 spear (65 g) has 11 mcg.  Sauerkraut (canned) -  cup (118 g) has 16 mcg.  Soybeans (cooked from fresh) -  cup (86 g) has 16 mcg.  Tomato (raw) - 1 medium (123 g) has 10 mcg.  Tomato sauce (raw) -  cup (123 g) has 17 mcg.   What foods do not have vitamin K? If a food contains less than 5 mcg per serving, it is considered to have no vitamin K. These foods include:  Bread and cereal products.  Cheese.  Eggs.  Fish and shellfish.  Meat and poultry.  Milk and dairy products.  Seeds,  such as sunflower or pumpkin seeds. The items listed above may not be a complete list of foods that have high, moderate, and low amounts of vitamin K or do not have vitamin K. Actual amounts of vitamin K may differ depending on processing. Contact a dietitian for more information. Summary  Warfarin is an anticoagulant that prevents blood clots by blocking the activity of vitamin K. It is important to monitor the content of vitamin K in your foods and to be consistent with the amount of vitamin K you take in each day.  Avoid major changes in your diet. If you are going to change your diet, talk with your health care provider before making changes. This information is not intended to replace advice given to you by your health care provider. Make sure you discuss any questions you have with your health care provider. Document Revised: 01/27/2019 Document Reviewed: 01/27/2019 Elsevier Patient Education  2021 [place] Heart-healthy meal planning includes:  Eating less unhealthy fats.  Eating more healthy fats.  Making other changes in your diet. Talk with your doctor or a diet specialist (dietitian) to create an eating plan that is right for you. What is my plan? Your doctor may recommend an eating plan that includes:  Total fat: ______% or less of total calories a day.  Saturated fat: ______% or less of total calories a day.  Cholesterol: less than _________mg a day. What are tips for following this plan? Cooking Avoid frying your food. Try to bake, boil, grill, or broil it instead. You can also reduce fat by:  Removing the skin from poultry.  Removing all visible fats from meats.  Steaming vegetables in water or broth. Meal planning  At meals, divide your plate into four equal parts: ? Fill one-half of your plate with vegetables and green salads. ? Fill one-fourth of your plate with whole grains. ? Fill one-fourth of your plate with lean protein  foods.  Eat 4-5 servings of vegetables per day. A serving of vegetables is: ? 1 cup of raw or cooked vegetables. ? 2 cups of raw leafy greens.  Eat 4-5 servings of fruit per day. A serving of fruit is: ? 1 medium whole fruit. ?  cup of dried fruit. ?  cup of fresh, frozen, or canned fruit. ?  cup of 100% fruit juice.  Eat more foods that have soluble fiber. These are apples, broccoli, carrots, beans, peas, and barley. Try to get 20-30 g of fiber per day.  Eat 4-5 servings of nuts, legumes, and seeds per week: ? 1 serving of dried beans or legumes equals  cup after being cooked. ? 1 serving of nuts is  cup. ? 1 serving of seeds equals 1 tablespoon.   General information  Eat more home-cooked food. Eat less restaurant, buffet, and fast food.  Limit or avoid alcohol.  Limit foods that are high in starch and sugar.  Avoid fried foods.  Lose weight if you are overweight.  Keep track of how much salt (sodium) you eat. This is important if you have high blood pressure. Ask your doctor to tell you more about this.  Try to add vegetarian meals each week. Fats  Choose healthy fats. These include olive oil and canola oil, flaxseeds, walnuts, almonds, and seeds.  Eat more omega-3 fats. These include salmon, mackerel, sardines, tuna, flaxseed oil, and ground flaxseeds. Try to eat fish at least 2 times each week.  Check food labels. Avoid foods with trans fats or high amounts of saturated fat.  Limit saturated fats. ? These are often found in animal products, such as meats, butter, and cream. ? These are also found in plant foods, such as palm oil, palm kernel oil, and coconut oil.  Avoid foods with partially hydrogenated oils in them. These have trans fats. Examples are stick margarine, some tub margarines, cookies, crackers, and other baked goods. What foods can I eat? Fruits All fresh, canned (in natural juice), or frozen fruits. Vegetables Fresh or frozen vegetables (raw,  steamed, roasted, or grilled). Green salads. Grains Most grains. Choose whole wheat and whole grains most of the time. Rice and pasta, including brown rice and pastas made with whole wheat. Meats and other proteins Lean, well-trimmed beef, veal, pork, and lamb. Chicken and [place] without skin. All fish and shellfish. Wild duck, rabbit, pheasant, and venison. Egg whites or low-cholesterol egg substitutes. Dried beans, peas, lentils, and tofu. Seeds and most nuts. Dairy Low-fat or nonfat cheeses, including ricotta and mozzarella. Skim or 1% milk that is liquid, powdered, or evaporated. Buttermilk that is made with low-fat milk. Nonfat or low-fat yogurt. Fats and oils Non-hydrogenated (trans-free) margarines. Vegetable oils, including soybean, sesame, sunflower, olive, peanut, safflower, corn, canola, and cottonseed. Salad dressings or mayonnaise made with a vegetable oil. Beverages Mineral water. Coffee and tea. Diet carbonated beverages. Sweets and desserts Sherbet, gelatin, and fruit ice. Small amounts of dark chocolate. Limit all sweets and desserts. Seasonings and condiments All seasonings and condiments. The items listed above may not be a complete list of foods and drinks you can eat. Contact a dietitian for more options. What foods should I avoid? Fruits Canned fruit in heavy syrup. Fruit in cream or butter sauce. Fried fruit. Limit coconut. Vegetables Vegetables cooked in cheese, cream, or butter sauce. Fried vegetables. Grains Breads that are made with saturated or trans fats, oils, or whole milk. Croissants. Sweet rolls. Donuts. High-fat crackers, such as cheese crackers. Meats and other proteins Fatty meats, such as hot dogs, ribs, sausage, bacon, rib-eye roast or steak. High-fat deli meats, such as salami and bologna. Caviar. Domestic duck and goose. Organ meats, such as liver. Dairy Cream, sour cream, cream cheese, and creamed cottage cheese. Whole-milk cheeses. Whole or 2% milk  that is liquid, evaporated, or condensed. Whole buttermilk. Cream sauce or high-fat cheese sauce. Yogurt that is made  from whole milk. Fats and oils Meat fat, or shortening. Cocoa butter, hydrogenated oils, palm oil, coconut oil, palm kernel oil. Solid fats and shortenings, including bacon fat, salt pork, lard, and butter. Nondairy cream substitutes. Salad dressings with cheese or sour cream. Beverages Regular sodas and juice drinks with added sugar. Sweets and desserts Frosting. Pudding. Cookies. Cakes. Pies. Milk chocolate or white chocolate. Buttered syrups. Full-fat ice cream or ice cream drinks. The items listed above may not be a complete list of foods and drinks to avoid. Contact a dietitian for more information. Summary  Heart-healthy meal planning includes eating less unhealthy fats, eating more healthy fats, and making other changes in your diet.  Eat a balanced diet. This includes fruits and vegetables, low-fat or nonfat dairy, lean protein, nuts and legumes, whole grains, and heart-healthy oils and fats. This information is not intended to replace advice given to you by your health care provider. Make sure you discuss any questions you have with your health care provider. Document Revised: 06/13/2017 Document Reviewed: 05/17/2017 Elsevier Patient Education  2021 [employer].

## 2020-06-09 NOTE — Progress Notes (Signed)
Brianna Darby, MD [address]  [place]  [place], [place] [address]  Main: [phone]  Fax: [phone]    Gastroenterology Consultation  Referring Provider:     [name], MD Primary Care Physician:  [name], MD Primary Gastroenterologist:  Dr. Cephas Harris Reason for Consultation:    Fatty liver        HPI:   MALAN WERK is a 60 y.o. female referred by Dr. Edwina Barth, Chrystie Nose, MD  for consultation & management of iron deficiency anemia.  Patient is chronic iron deficiency anemia of unclear etiology.  Patient has history of aortic valve stenosis s/p AVR on 02/14/2018 at [facility], has been maintained on Coumadin since then, s/p pacemaker due to high-grade heart block perioperatively.  Review of records revealed that she has anemia for last 2 years.  Lowest hemoglobin was 9.5, does have severe iron deficiency, serum ferritin 6 in 11/2019.  Patient has seen Dr. Janese Banks for this, received 2 iron infusions.  Most recent hemoglobin was 10.1, MCV 69.4 in 11/2019.  Patient reports that her energy levels are improving.  She continues to have craving for ice.  She quit smoking since her valve replacement.  Patient is accompanied by an interpreter as well as her mom.  Patient denies any GI symptoms today.  She reports having 1-2 soft bowel movements which is normal for her.  She denies any rectal bleeding  Follow-up visit 04/07/2020 Patient is with no complaints today.  She underwent iron infusions and her iron deficiency anemia has resolved.  Her iron levels are at goal.  She underwent upper endoscopy as well as colonoscopy, upper endoscopy revealed focal intestinal metaplasia only and colonoscopy revealed nonbleeding medium-sized AVM in the cecum which was treated with APC.  No evidence of H. pylori.  Patient has elevated LFTs AST, ALT and alkaline phosphatase, 1-2 times upper limit of normal since July 2021.  She does have mildly elevated hemoglobin A1c, being treated  for diabetes with Metformin.  Patient eats out at restaurants almost on a daily basis, her diet consists of mostly high-calorie foods  Follow-up visit 06/09/2020 Patient is here for follow-up of her elevated liver enzymes.  She has fatty liver as well as worsening of hemoglobin A1c.  Patient is here with her mom today.  Her mom is concerned about her poor eating habits which is contributing to worsened diabetes.  She does eat ice cream, brownies, fried foods every day.  She is also on Coumadin, cannot eat leafy greens. Her LFTs are persistently elevated.  Patient underwent secondary liver disease work-up which revealed mildly elevated serum ferritin levels, HCV RNA undetected, ANA positive.  Patient leads a sedentary lifestyle  NSAIDs: None  Antiplts/Anticoagulants/Anti thrombotics: Coumadin for history of AVR  GI Procedures:   EGD and colonoscopy 02/02/2020 - Normal examined duodenum. Biopsied. - Erythematous mucosa in the gastric body and antrum. Biopsied. - Esophagogastric landmarks identified. - Normal gastroesophageal junction and esophagus.  - The examined portion of the ileum was normal. - Diverticulosis in the sigmoid colon. - A single non-bleeding colonic angioectasia. Treated with argon plasma coagulation (APC). - The distal rectum and anal verge are normal on retroflexion view. - No specimens collected.  DIAGNOSIS:  A. DUODENUM; COLD BIOPSY:  - ENTERIC MUCOSA WITH PRESERVED VILLOUS ARCHITECTURE AND NO SIGNIFICANT  HISTOPATHOLOGIC CHANGE.  - NEGATIVE FOR FEATURES OF CELIAC, DYSPLASIA, AND MALIGNANCY.   B. STOMACH; COLD BIOPSY:  - GASTRIC ANTRAL AND OXYNTIC MUCOSA WITH  CHRONIC GASTRITIS WITH FOCAL  ACTIVITY.  - INTESTINAL METAPLASIA IS PRESENT.  - NEGATIVE FOR H. PYLORI, DYSPLASIA, AND MALIGNANCY.   Comment:  Due to the presence of focal active inflammation, an immunohistochemical  study directed against H. pylori was performed and is negative.   EGD and colonoscopy  08/17/2016 - Normal esophagus. - Small hiatal hernia. - Gastritis. Biopsied. - Normal examined duodenum.  - One diminutive polyp in the rectum, removed with a jumbo cold forceps. Resected and retrieved. - One 10 mm polyp in the sigmoid colon, removed with a hot snare. Resected and retrieved. - Internal hemorrhoids. - The examination was otherwise normal.  DIAGNOSIS:  A. STOMACH, ANTRUM AND BODY; COLD BIOPSY:  - OXYNTIC MUCOSA WITH MODERATE CHRONIC GASTRITIS AND FOCAL INTESTINAL  METAPLASIA.  - IMMUNOHISTOCHEMICAL STAIN FOR H. PYLORI WILL BE REPORTED IN AN  ADDENDUM.  - NEGATIVE FOR DYSPLASIA AND MALIGNANCY.   B. RECTUM POLYP; COLD BIOPSY:  - HYPERPLASTIC POLYP.  - NEGATIVE FOR DYSPLASIA AND MALIGNANCY.   C. COLON POLYP, SIGMOID; HOT SNARE:  - HYPERPLASTIC POLYP.  - NEGATIVE FOR DYSPLASIA AND MALIGNANCY.   ADDENDUM:  An immunohistochemical stain for H. pylori was performed and is  negative. Stain controls worked appropriately.   Past Medical History:  Diagnosis Date  . Asthma   . Deafness   . Depression   . Diabetes mellitus without complication ([place])   . GERD (gastroesophageal reflux disease)   . Heart abnormality    per mother pt has bad heart murmor- sees Dr Ubaldo Glassing   . Hyperlipidemia   . Hypertension     Past Surgical History:  Procedure Laterality Date  . ABDOMINAL HYSTERECTOMY  2000   per mother [facility] thought pt cancer and did hyster ,cervix and ovaries  . CHOLECYSTECTOMY    . COLONOSCOPY WITH PROPOFOL N/A 08/17/2016   Procedure: COLONOSCOPY WITH PROPOFOL;  Surgeon: Manya Silvas, MD;  Location[facility] ENDOSCOPY;  Service: Endoscopy;  Laterality: N/A;  . COLONOSCOPY WITH PROPOFOL N/A 02/02/2020   Procedure: COLONOSCOPY WITH PROPOFOL;  Surgeon: [name], MD;  Location: [facility] ENDOSCOPY;  Service: Gastroenterology;  Laterality: N/A;  . ESOPHAGOGASTRODUODENOSCOPY (EGD) WITH PROPOFOL N/A 08/17/2016   Procedure: ESOPHAGOGASTRODUODENOSCOPY (EGD) WITH PROPOFOL;   Surgeon: Manya Silvas, MD;  Location: [facility] ENDOSCOPY;  Service: Endoscopy;  Laterality: N/A;  . ESOPHAGOGASTRODUODENOSCOPY (EGD) WITH PROPOFOL  02/02/2020   Procedure: ESOPHAGOGASTRODUODENOSCOPY (EGD) WITH PROPOFOL;  Surgeon: [name], MD;  Location: [facility] ENDOSCOPY;  Service: Gastroenterology;;  . PACEMAKER IMPLANT  01/2018    Current Outpatient Medications:  .  amitriptyline (ELAVIL) 25 MG tablet, Take 25 mg by mouth at bedtime. , Disp: , Rfl:  .  amLODipine (NORVASC) 5 MG tablet, Take 2.5 mg by mouth in the morning and at bedtime. , Disp: , Rfl:  .  atorvastatin (LIPITOR) 40 MG tablet, Take 40 mg by mouth daily. , Disp: , Rfl:  .  Blood Glucose Monitoring Suppl (ONE TOUCH ULTRA 2) w/Device KIT, See admin instructions., Disp: , Rfl:  .  FLUoxetine (PROZAC) 10 MG capsule, Take by mouth., Disp: , Rfl:  .  lamoTRIgine (LAMICTAL) 100 MG tablet, Take 50 mg by mouth 2 (two) times daily. , Disp: , Rfl:  .  Lancets (ONETOUCH DELICA PLUS ZYYQMG50I) MISC, , Disp: , Rfl:  .  metFORMIN (GLUCOPHAGE-XR) 500 MG 24 hr tablet, Take 1,000 mg by mouth in the morning and at bedtime. , Disp: , Rfl:  .  metoprolol tartrate (LOPRESSOR) 25 MG tablet, Take 25  mg by mouth 2 (two) times daily. , Disp: , Rfl:  .  omeprazole (PRILOSEC) 40 MG capsule, Take 40 mg by mouth daily., Disp: , Rfl:  .  ONE TOUCH ULTRA TEST test strip, , Disp: , Rfl:  .  OneTouch Delica Lancets 09F MISC, Use once daily to check blood sugars dx e11.9, Disp: , Rfl:  .  warfarin (COUMADIN) 1 MG tablet, Take 2.5 mg by mouth one time only at 4 PM. , Disp: , Rfl:    Family History  Problem Relation Age of Onset  . Diabetes Maternal Uncle   . Heart attack Father   . Kidney cancer Paternal Uncle   . Breast cancer Neg Hx   . Bladder Cancer Neg Hx      Social History   Tobacco Use  . Smoking status: Former Smoker    Packs/day: 1.50    Years: 39.00    Pack years: 58.50    Types: Cigarettes    Quit date: 02/13/2018    Years  since quitting: 2.3  . Smokeless tobacco: Never Used  Vaping Use  . Vaping Use: Former  Substance Use Topics  . Alcohol use: Not Currently  . Drug use: Yes    Types: Marijuana, "Crack" cocaine    Comment: smoked 3 yr ago,for 2 y. Hx of cocaine use     Allergies as of 06/09/2020  . (No Known Allergies)    Review of Systems:    All systems reviewed and negative except where noted in HPI.   Physical Exam:  BP 112/69 (BP Location: Left Arm, Patient Position: Sitting, Cuff Size: Normal)   Pulse 83   Temp 97.6 F (36.4 C) (Oral)   Ht '5\' 2"'  (1.575 m)   Wt 146 lb 2 oz (66.3 kg)   BMI 26.73 kg/m  No LMP recorded. Patient has had a hysterectomy.  General:   Alert,  Well-developed, well-nourished, pleasant and cooperative in NAD Head:  Normocephalic and atraumatic. Eyes:  Sclera clear, no icterus.   Conjunctiva pink. Ears:  Normal auditory acuity. Nose:  No deformity, discharge, or lesions. Mouth:  No deformity or lesions,oropharynx pink & moist. Neck:  Supple; no masses or thyromegaly. Lungs:  Respirations even and unlabored.  Clear throughout to auscultation.   No wheezes, crackles, or rhonchi. No acute distress. Heart:  Regular rate and rhythm; no murmurs, clicks, rubs, or gallops. Abdomen:  Normal bowel sounds. Soft, obese, non-tender and non-distended without masses, hepatosplenomegaly or hernias noted.  No guarding or rebound tenderness.   Rectal: Not performed Msk:  Symmetrical without gross deformities. Good, equal movement & strength bilaterally. Pulses:  Normal pulses noted. Extremities:  No clubbing or edema.  No cyanosis. Neurologic:  Alert and oriented x3;  grossly normal neurologically. Skin:  Intact without significant lesions or rashes. No jaundice. Psych:  Alert and cooperative. Normal mood and affect.  Imaging Studies: Fatty liver based on CT in 11/2013  Assessment and Plan:   TIAIRA ARAMBULA is a 60 y.o. female with metabolic syndrome, bicuspid aortic valve  s/p valve replacement in October 2019, s/p pacemaker on Coumadin is seen in for follow-up of chronic iron deficiency anemia and elevated LFTs  Chronic iron deficiency anemia likely secondary to chronic blood loss from large bowel AVM Upper endoscopy revealed focal intestinal metaplasia, no evidence of H. pylori.  No evidence of celiac disease.  Colonoscopy revealed AVM in the cecum which was treated with APC.  Since, iron deficiency anemia has resolved, will defer video capsule  endoscopy at this time.  Elevated LFTs: Etiology is probably secondary to fatty liver from metabolic syndrome right upper quadrant ultrasound revealed hepatic steatosis secondary liver disease work-up revealed positive ANA only which can be elevated in setting of fatty liver Today, I have discussed in length regarding healthy eating habits, walking at least 5000-10,000 steps daily We will also refer her to dietitian Recheck LFTs in 3 months, if persistently elevated, recommend liver biopsy Tight control of diabetes   Follow up in 4 months   Brianna Darby, MD

## 2020-06-15 IMAGING — CT CT HEAD W/O CM
2 of 4 series · 13 of 47 positions shown, 16 images · non-contrast
Comparison: Brain MRI April 10, 2017

CLINICAL DATA: Progressive dizziness

EXAM:
CT HEAD WITHOUT CONTRAST
TECHNIQUE: Contiguous axial images were obtained from the base of the skull
through the vertex without intravenous contrast.

[Series 4: head · axial · 0.32mm/px · z∈[-515,-400]mm · 10 of 69 slices shown, 13 images (1 of 2)]
[im 7/69  brain]
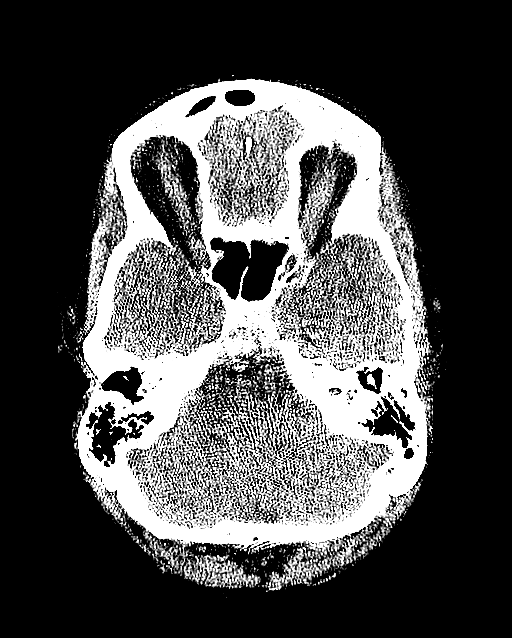
[im 7/69  bone]
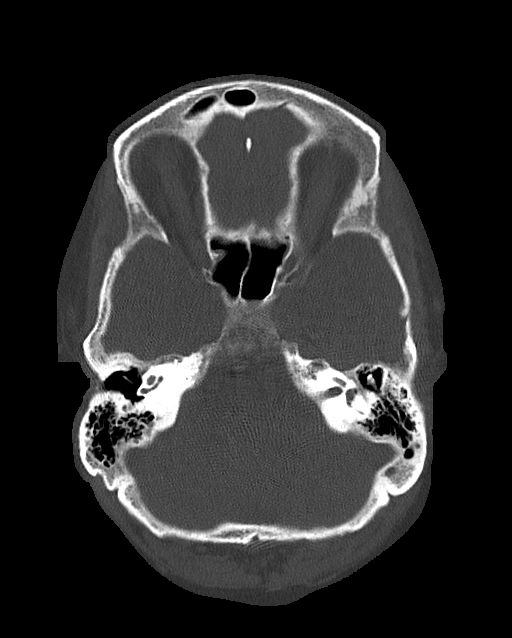
[im 13/69  brain]
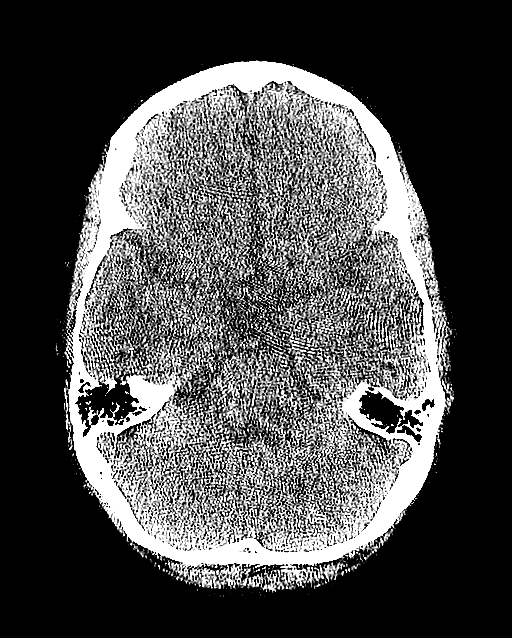
[im 20/69  brain]
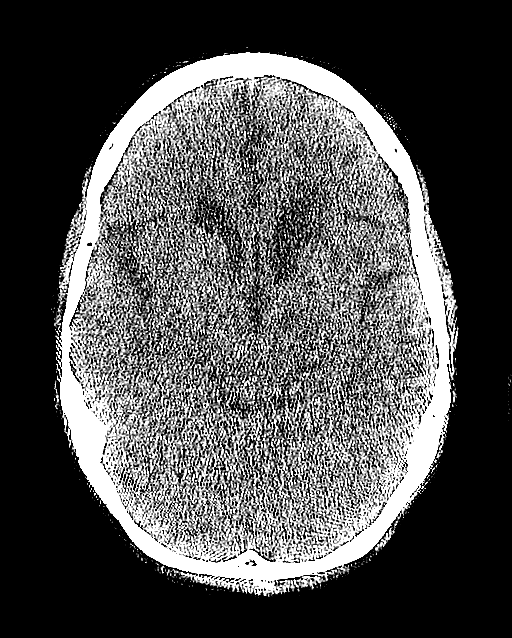
[im 26/69  brain]
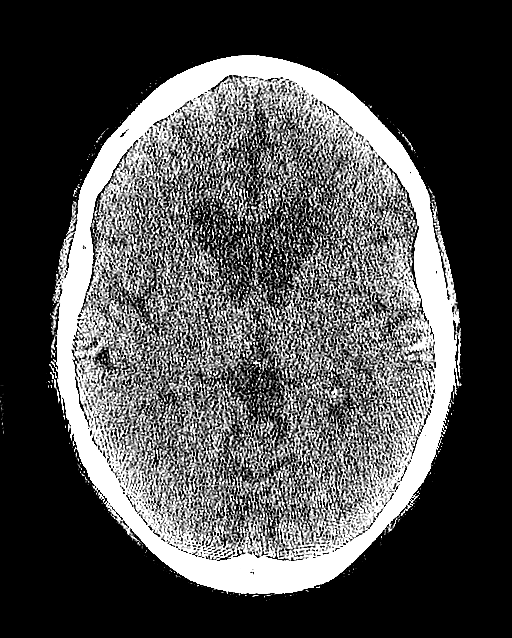
[im 33/69  brain]
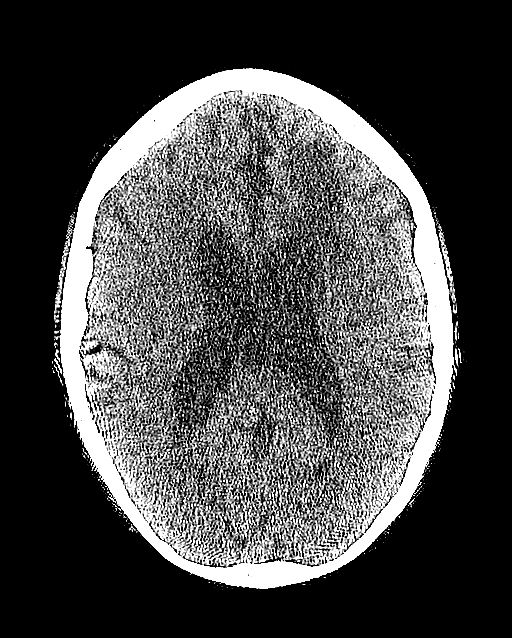
[im 33/69  bone]
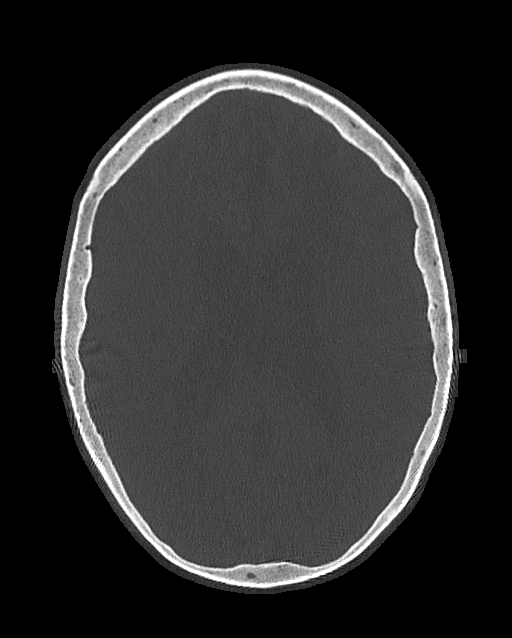
[im 39/69  brain]
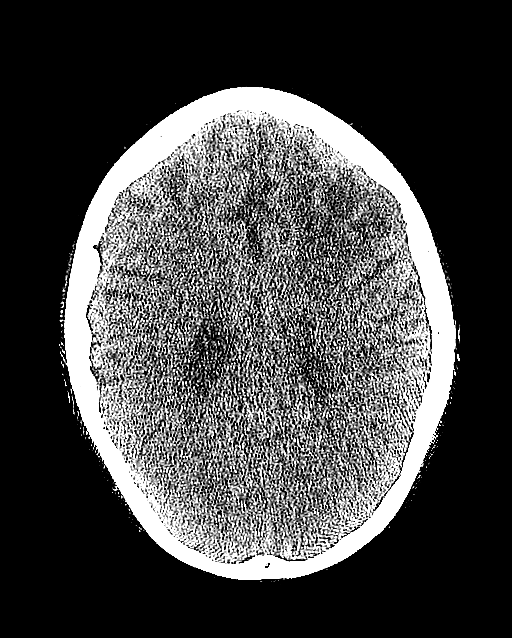
[im 46/69  brain]
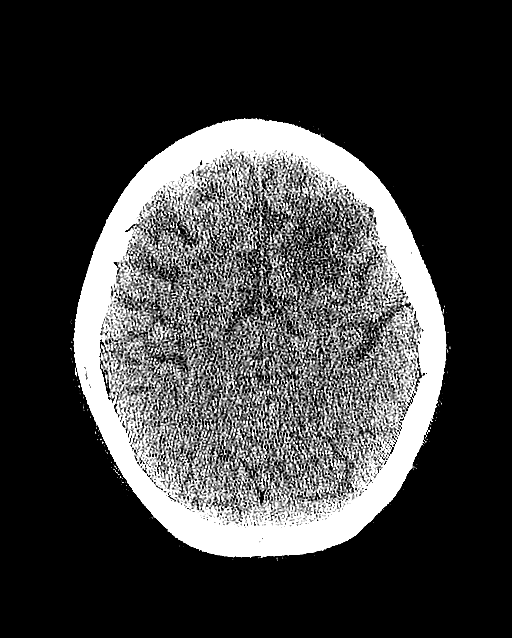
[im 52/69  brain]
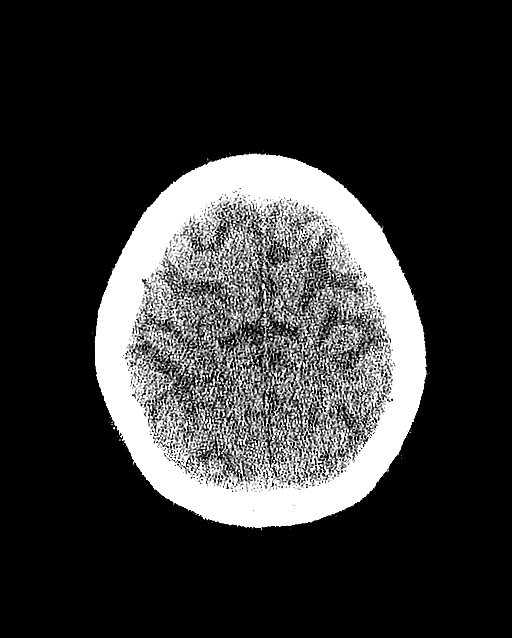
[im 59/69  brain]
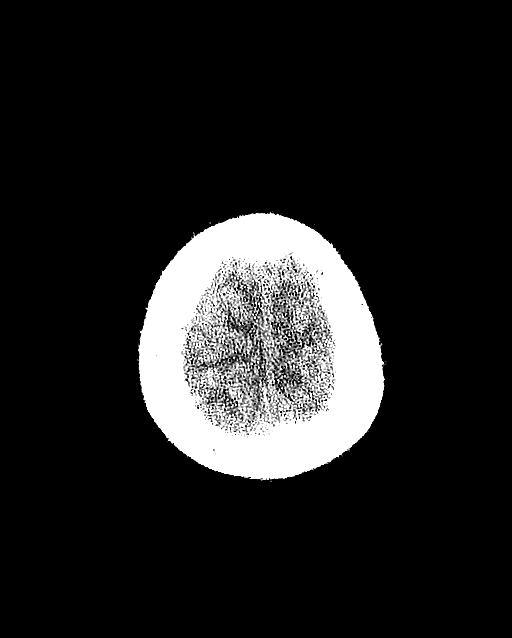
[im 59/69  bone]
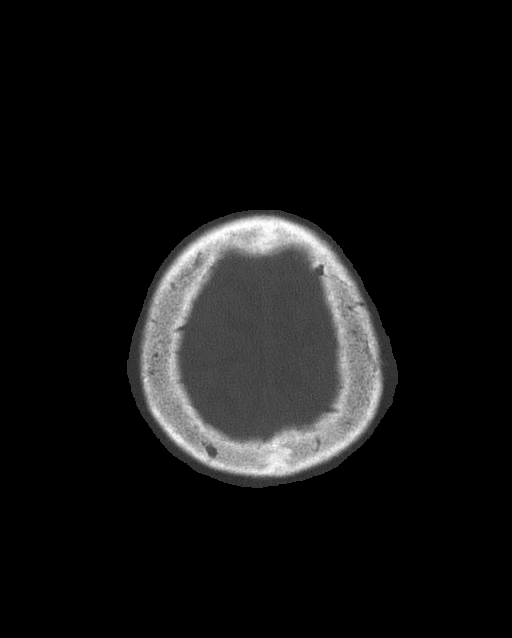
[im 65/69  brain]
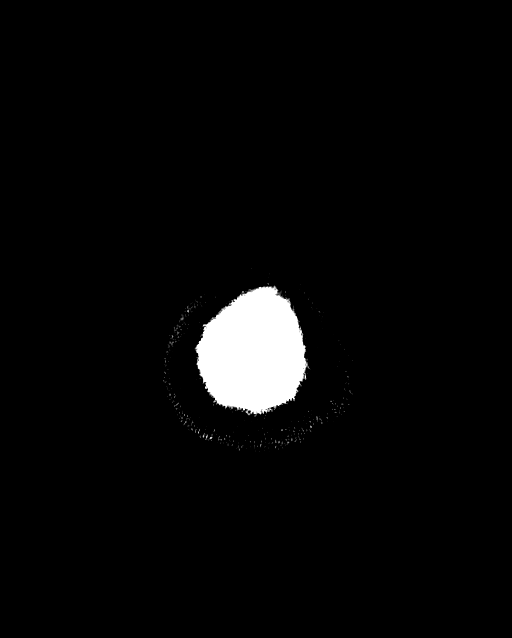

[Series 6: head · coronal · 0.27mm/px · 3 of 68 slices shown (2 of 2)]
[im 23/68  brain]
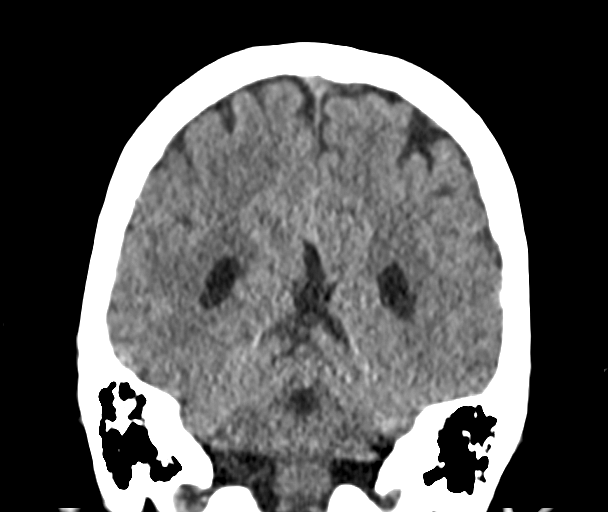
[im 30/68  brain]
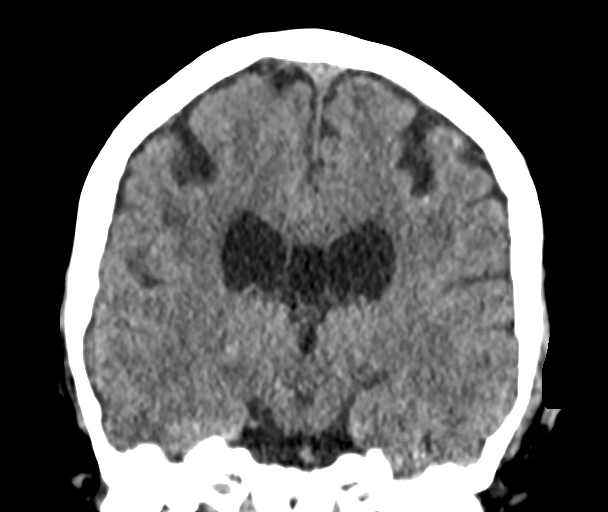
[im 38/68  brain]
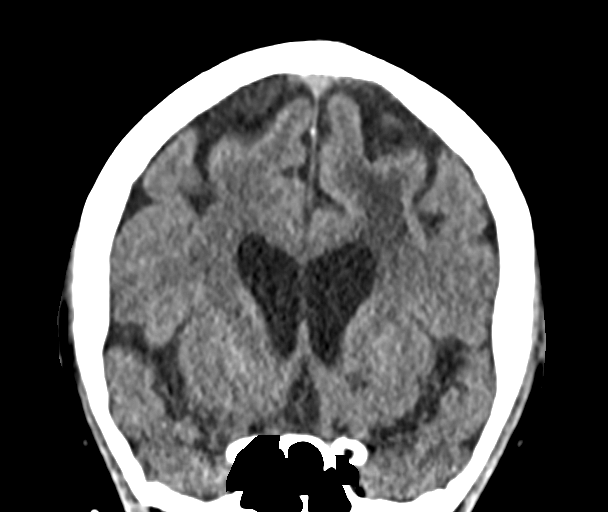

[13 of 47 positions shown; findings below may reference images not displayed]

FINDINGS: Brain: Mild diffuse atrophy is stable. There is no intracranial
mass, hemorrhage, extra-axial fluid collection, or midline shift.
There is evidence of a prior infarct in the superior left frontal
lobe, stable. There is small vessel disease in the anterior centra
semiovale bilaterally, more severe on the left than on the right,
stable. No acute infarct is demonstrable.

Vascular: No hyperdense vessel. There is calcification in the left
carotid siphon region.

Skull: The bony calvarium appears intact.

Sinuses/Orbits: There is mucosal thickening in the sphenoid sinus
regions. There is also mucosal thickening in several ethmoid air
cells. There is a benign osteoma measuring 7 x 7 mm in the anterior
ethmoid region near the junction with the left frontal sinus.
Visualized orbits appear symmetric bilaterally.

Other: Mastoid air cells are clear.
IMPRESSION: Stable atrophy with periventricular small vessel disease. Prior
infarct superior anterior left frontal lobe, stable. No acute
infarct. No mass or hemorrhage.

Mild arterial vascular calcification noted. Areas of paranasal sinus
disease noted.

## 2020-06-28 DIAGNOSIS — Y92009 Unspecified place in unspecified non-institutional (private) residence as the place of occurrence of the external cause: Secondary | ICD-10-CM | POA: Diagnosis not present

## 2020-06-28 DIAGNOSIS — S8992XA Unspecified injury of left lower leg, initial encounter: Secondary | ICD-10-CM | POA: Diagnosis not present

## 2020-06-28 DIAGNOSIS — M1712 Unilateral primary osteoarthritis, left knee: Secondary | ICD-10-CM | POA: Diagnosis not present

## 2020-06-28 DIAGNOSIS — M778 Other enthesopathies, not elsewhere classified: Secondary | ICD-10-CM | POA: Diagnosis not present

## 2020-06-28 DIAGNOSIS — W010XXA Fall on same level from slipping, tripping and stumbling without subsequent striking against object, initial encounter: Secondary | ICD-10-CM | POA: Diagnosis not present

## 2020-06-30 DIAGNOSIS — Z7901 Long term (current) use of anticoagulants: Secondary | ICD-10-CM | POA: Diagnosis not present

## 2020-07-08 DIAGNOSIS — M25462 Effusion, left knee: Secondary | ICD-10-CM | POA: Diagnosis not present

## 2020-07-08 DIAGNOSIS — E119 Type 2 diabetes mellitus without complications: Secondary | ICD-10-CM | POA: Diagnosis not present

## 2020-07-08 DIAGNOSIS — S83412A Sprain of medial collateral ligament of left knee, initial encounter: Secondary | ICD-10-CM | POA: Diagnosis not present

## 2020-07-08 DIAGNOSIS — M1712 Unilateral primary osteoarthritis, left knee: Secondary | ICD-10-CM | POA: Diagnosis not present

## 2020-07-14 DIAGNOSIS — I700 Atherosclerosis of aorta: Secondary | ICD-10-CM | POA: Diagnosis not present

## 2020-07-14 DIAGNOSIS — Z952 Presence of prosthetic heart valve: Secondary | ICD-10-CM | POA: Diagnosis not present

## 2020-07-14 DIAGNOSIS — I350 Nonrheumatic aortic (valve) stenosis: Secondary | ICD-10-CM | POA: Diagnosis not present

## 2020-07-14 DIAGNOSIS — Z7901 Long term (current) use of anticoagulants: Secondary | ICD-10-CM | POA: Diagnosis not present

## 2020-07-14 DIAGNOSIS — Z950 Presence of cardiac pacemaker: Secondary | ICD-10-CM | POA: Diagnosis not present

## 2020-07-14 DIAGNOSIS — I10 Essential (primary) hypertension: Secondary | ICD-10-CM | POA: Diagnosis not present

## 2020-07-21 DIAGNOSIS — I350 Nonrheumatic aortic (valve) stenosis: Secondary | ICD-10-CM | POA: Diagnosis not present

## 2020-07-22 DIAGNOSIS — E119 Type 2 diabetes mellitus without complications: Secondary | ICD-10-CM | POA: Diagnosis not present

## 2020-07-22 DIAGNOSIS — M1712 Unilateral primary osteoarthritis, left knee: Secondary | ICD-10-CM | POA: Diagnosis not present

## 2020-07-22 DIAGNOSIS — S83412D Sprain of medial collateral ligament of left knee, subsequent encounter: Secondary | ICD-10-CM | POA: Diagnosis not present

## 2020-07-25 ENCOUNTER — Ambulatory Visit: Payer: PPO | Admitting: Dietician

## 2020-08-04 ENCOUNTER — Other Ambulatory Visit: Payer: PPO

## 2020-08-04 ENCOUNTER — Ambulatory Visit: Payer: PPO | Admitting: Oncology

## 2020-08-04 DIAGNOSIS — Z7901 Long term (current) use of anticoagulants: Secondary | ICD-10-CM | POA: Diagnosis not present

## 2020-08-11 DIAGNOSIS — E119 Type 2 diabetes mellitus without complications: Secondary | ICD-10-CM | POA: Diagnosis not present

## 2020-08-18 DIAGNOSIS — K760 Fatty (change of) liver, not elsewhere classified: Secondary | ICD-10-CM | POA: Diagnosis not present

## 2020-08-18 DIAGNOSIS — E7801 Familial hypercholesterolemia: Secondary | ICD-10-CM | POA: Diagnosis not present

## 2020-08-18 DIAGNOSIS — E119 Type 2 diabetes mellitus without complications: Secondary | ICD-10-CM | POA: Diagnosis not present

## 2020-08-18 DIAGNOSIS — Z952 Presence of prosthetic heart valve: Secondary | ICD-10-CM | POA: Diagnosis not present

## 2020-08-18 DIAGNOSIS — Z720 Tobacco use: Secondary | ICD-10-CM | POA: Diagnosis not present

## 2020-08-18 DIAGNOSIS — Z0000 Encounter for general adult medical examination without abnormal findings: Secondary | ICD-10-CM | POA: Diagnosis not present

## 2020-08-18 DIAGNOSIS — H9193 Unspecified hearing loss, bilateral: Secondary | ICD-10-CM | POA: Diagnosis not present

## 2020-08-18 DIAGNOSIS — I10 Essential (primary) hypertension: Secondary | ICD-10-CM | POA: Diagnosis not present

## 2020-08-18 DIAGNOSIS — J4520 Mild intermittent asthma, uncomplicated: Secondary | ICD-10-CM | POA: Diagnosis not present

## 2020-08-18 DIAGNOSIS — F32A Depression, unspecified: Secondary | ICD-10-CM | POA: Diagnosis not present

## 2020-08-22 ENCOUNTER — Other Ambulatory Visit: Payer: Self-pay | Admitting: Internal Medicine

## 2020-08-22 DIAGNOSIS — Z1231 Encounter for screening mammogram for malignant neoplasm of breast: Secondary | ICD-10-CM

## 2020-08-25 DIAGNOSIS — Z7901 Long term (current) use of anticoagulants: Secondary | ICD-10-CM | POA: Diagnosis not present

## 2020-08-30 ENCOUNTER — Telehealth: Payer: Self-pay | Admitting: Oncology

## 2020-08-30 ENCOUNTER — Encounter: Payer: Self-pay | Admitting: Oncology

## 2020-08-30 NOTE — Telephone Encounter (Signed)
Spoke with patient's mother regarding message left by patient on answering service. Patient has requested a later appt and I wanted to confirm---Patient's mother stated that she would communicate with patient and return phone call. Gave her my extension to call back directly.

## 2020-08-31 ENCOUNTER — Telehealth: Payer: Self-pay | Admitting: Oncology

## 2020-08-31 NOTE — Telephone Encounter (Signed)
Called patient's mother, [name], to let her know that there is no available sign language interpreters for patient's appt on 5/12. Notified her that I had called both the hospital system services and the ASL interpreter services. Rather than reschedule, she stated that she would like to keep appt on 5/12 and use the video/virtual interpretor.  She will notify her daughter. She has also requested a phone call from [place] after the appointment to get an update. RN aware.

## 2020-09-01 ENCOUNTER — Other Ambulatory Visit: Payer: Self-pay

## 2020-09-01 ENCOUNTER — Telehealth: Payer: Self-pay | Admitting: *Deleted

## 2020-09-01 ENCOUNTER — Inpatient Hospital Stay: Payer: PPO | Attending: Oncology

## 2020-09-01 ENCOUNTER — Inpatient Hospital Stay (HOSPITAL_BASED_OUTPATIENT_CLINIC_OR_DEPARTMENT_OTHER): Payer: PPO | Admitting: Oncology

## 2020-09-01 ENCOUNTER — Encounter: Payer: Self-pay | Admitting: Oncology

## 2020-09-01 VITALS — BP 146/72 | HR 82 | Temp 97.5°F | Resp 18 | Wt 139.0 lb

## 2020-09-01 DIAGNOSIS — Z87891 Personal history of nicotine dependence: Secondary | ICD-10-CM | POA: Insufficient documentation

## 2020-09-01 DIAGNOSIS — Z8051 Family history of malignant neoplasm of kidney: Secondary | ICD-10-CM | POA: Diagnosis not present

## 2020-09-01 DIAGNOSIS — D72829 Elevated white blood cell count, unspecified: Secondary | ICD-10-CM | POA: Insufficient documentation

## 2020-09-01 DIAGNOSIS — Z90710 Acquired absence of both cervix and uterus: Secondary | ICD-10-CM | POA: Diagnosis not present

## 2020-09-01 DIAGNOSIS — F1290 Cannabis use, unspecified, uncomplicated: Secondary | ICD-10-CM | POA: Diagnosis not present

## 2020-09-01 DIAGNOSIS — D509 Iron deficiency anemia, unspecified: Secondary | ICD-10-CM

## 2020-09-01 DIAGNOSIS — D508 Other iron deficiency anemias: Secondary | ICD-10-CM

## 2020-09-01 LAB — CBC WITH DIFFERENTIAL/PLATELET
Abs Immature Granulocytes: 0.04 10*3/uL (ref 0.00–0.07)
Basophils Absolute: 0.1 10*3/uL (ref 0.0–0.1)
Basophils Relative: 1 %
Eosinophils Absolute: 0.5 10*3/uL (ref 0.0–0.5)
Eosinophils Relative: 4 %
HCT: 42.6 % (ref 36.0–46.0)
Hemoglobin: 14.1 g/dL (ref 12.0–15.0)
Immature Granulocytes: 0 %
Lymphocytes Relative: 30 %
Lymphs Abs: 3.6 10*3/uL (ref 0.7–4.0)
MCH: 29.3 pg (ref 26.0–34.0)
MCHC: 33.1 g/dL (ref 30.0–36.0)
MCV: 88.4 fL (ref 80.0–100.0)
Monocytes Absolute: 0.6 10*3/uL (ref 0.1–1.0)
Monocytes Relative: 5 %
Neutro Abs: 7.3 10*3/uL (ref 1.7–7.7)
Neutrophils Relative %: 60 %
Platelets: 324 10*3/uL (ref 150–400)
RBC: 4.82 MIL/uL (ref 3.87–5.11)
RDW: 13.1 % (ref 11.5–15.5)
WBC: 12.2 10*3/uL — ABNORMAL HIGH (ref 4.0–10.5)
nRBC: 0 % (ref 0.0–0.2)

## 2020-09-01 LAB — IRON AND TIBC
Iron: 64 ug/dL (ref 28–170)
Saturation Ratios: 17 % (ref 10.4–31.8)
TIBC: 370 ug/dL (ref 250–450)
UIBC: 306 ug/dL

## 2020-09-01 LAB — FERRITIN: Ferritin: 80 ng/mL (ref 11–307)

## 2020-09-01 NOTE — Telephone Encounter (Signed)
Incoming call from patient's mother, [name]. She wanted to know the plan of care and what the patient was told today by Dr. Janese Banks.  She also wanted to update the nursing team on the patient's medication list.

## 2020-09-01 NOTE — Progress Notes (Signed)
Pt and friend in for visit today.  Along with signing interpreter. Pt was unaware of medications she is taking, states mother fills her pill box.  RN asked pt to please take avs after today's visit and have mother check med list and bring back next visit.  Pt verbalized understanding.

## 2020-09-01 NOTE — Progress Notes (Signed)
   Hematology/Oncology Consult note [facility]  [phone] [phone]  Patient Care Team: Johnston, John D, MD as PCP - General (Internal Medicine) Fath, Kenneth A, MD as Consulting Physician (Cardiology)   Name of the patient: Brianna Harris  [id number]  02/15/1961   Date of visit: 09/01/20  Diagnosis- -iron deficiency anemia  Chief complaint/ Reason for visit-routine follow-up of iron deficiency anemia  Heme/Onc history: Patient is a 60-year-old female who have seen for leukocytosis since August 2019.  BCR ABL testing was negative for CML and peripheral flow cytometry showed no significant immunophenotypic abnormality.  Differential mainly shows neutrophilia and lymphocytosis and white count has been relatively stable between 11-15 over the last 4 years.  Patient has also been seeing me for her anemia and underwent EGD and colonoscopy with GI.  Colonoscopy showed 1 nonbleeding AVM which was treated with APC.Also had biopsies of duodenum and stomach.  Stomach biopsy showed intestinal metaplasia but otherwise negative for celiac dysplasia or malignancy. She also underwent heart valve surgery is on Coumadin.  She has received IV iron in the past  Interval history-history obtained with the help of sign language interpreter.  Patient reports no blood in her stool or urine.  Energy levels are stable  ECOG PS- 1 Pain scale- 0   Review of systems- Review of Systems  Constitutional: Negative for chills, fever, malaise/fatigue and weight loss.  HENT: Negative for congestion, ear discharge and nosebleeds.   Eyes: Negative for blurred vision.  Respiratory: Negative for cough, hemoptysis, sputum production, shortness of breath and wheezing.   Cardiovascular: Negative for chest pain, palpitations, orthopnea and claudication.  Gastrointestinal: Negative for abdominal pain, blood in stool, constipation, diarrhea, heartburn, melena, nausea and vomiting.   Genitourinary: Negative for dysuria, flank pain, frequency, hematuria and urgency.  Musculoskeletal: Negative for back pain, joint pain and myalgias.  Skin: Negative for rash.  Neurological: Negative for dizziness, tingling, focal weakness, seizures, weakness and headaches.  Endo/Heme/Allergies: Does not bruise/bleed easily.  Psychiatric/Behavioral: Negative for depression and suicidal ideas. The patient does not have insomnia.        No Known Allergies   Past Medical History:  Diagnosis Date  . Asthma   . Deafness   . Depression   . Diabetes mellitus without complication ([facility])   . GERD (gastroesophageal reflux disease)   . Heart abnormality    per mother pt has bad heart murmor- sees Dr Fath   . Hyperlipidemia   . Hypertension      Past Surgical History:  Procedure Laterality Date  . ABDOMINAL HYSTERECTOMY  2000   per mother [facility] thought pt cancer and did hyster ,cervix and ovaries  . CHOLECYSTECTOMY    . COLONOSCOPY WITH PROPOFOL N/A 08/17/2016   Procedure: COLONOSCOPY WITH PROPOFOL;  Surgeon: Robert T Elliott, MD;  Location: [facility] ENDOSCOPY;  Service: Endoscopy;  Laterality: N/A;  . COLONOSCOPY WITH PROPOFOL N/A 02/02/2020   Procedure: COLONOSCOPY WITH PROPOFOL;  Surgeon: Vanga, Rohini Reddy, MD;  Location: [facility] ENDOSCOPY;  Service: Gastroenterology;  Laterality: N/A;  . ESOPHAGOGASTRODUODENOSCOPY (EGD) WITH PROPOFOL N/A 08/17/2016   Procedure: ESOPHAGOGASTRODUODENOSCOPY (EGD) WITH PROPOFOL;  Surgeon: Robert T Elliott, MD;  Location: [facility] ENDOSCOPY;  Service: Endoscopy;  Laterality: N/A;  . ESOPHAGOGASTRODUODENOSCOPY (EGD) WITH PROPOFOL  02/02/2020   Procedure: ESOPHAGOGASTRODUODENOSCOPY (EGD) WITH PROPOFOL;  Surgeon: Vanga, Rohini Reddy, MD;  Location: [facility] ENDOSCOPY;  Service: Gastroenterology;;  . PACEMAKER IMPLANT  01/2018    Social History   Socioeconomic History  . Marital status:   Single    Spouse name: Not on file  . Number of children: Not on file  . Years of  education: Not on file  . Highest education level: Not on file  Occupational History  . Not on file  Tobacco Use  . Smoking status: Former Smoker    Packs/day: 1.50    Years: 39.00    Pack years: 58.50    Types: Cigarettes    Quit date: 02/13/2018    Years since quitting: 2.5  . Smokeless tobacco: Never Used  Vaping Use  . Vaping Use: Former  Substance and Sexual Activity  . Alcohol use: Not Currently  . Drug use: Yes    Types: Marijuana, "Crack" cocaine    Comment: smoked 3 yr ago,for 2 y. Hx of cocaine use   . Sexual activity: Never  Other Topics Concern  . Not on file  Social History Narrative  . Not on file   Social Determinants of Health   Financial Resource Strain: Not on file  Food Insecurity: Not on file  Transportation Needs: Not on file  Physical Activity: Not on file  Stress: Not on file  Social Connections: Not on file  Intimate Partner Violence: Not on file    Family History  Problem Relation Age of Onset  . Diabetes Maternal Uncle   . Heart attack Father   . Kidney cancer Paternal Uncle   . Breast cancer Neg Hx   . Bladder Cancer Neg Hx      Current Outpatient Medications:  .  amitriptyline (ELAVIL) 25 MG tablet, Take 25 mg by mouth at bedtime. , Disp: , Rfl:  .  amLODipine (NORVASC) 5 MG tablet, Take 2.5 mg by mouth in the morning and at bedtime. , Disp: , Rfl:  .  atorvastatin (LIPITOR) 40 MG tablet, Take 40 mg by mouth daily. , Disp: , Rfl:  .  Blood Glucose Monitoring Suppl (ONE TOUCH ULTRA 2) w/Device KIT, See admin instructions., Disp: , Rfl:  .  FLUoxetine (PROZAC) 10 MG capsule, Take by mouth., Disp: , Rfl:  .  lamoTRIgine (LAMICTAL) 100 MG tablet, Take 50 mg by mouth 2 (two) times daily. , Disp: , Rfl:  .  Lancets (ONETOUCH DELICA PLUS [id number]) MISC, , Disp: , Rfl:  .  metoprolol tartrate (LOPRESSOR) 25 MG tablet, Take 25 mg by mouth 2 (two) times daily. , Disp: , Rfl:  .  omeprazole (PRILOSEC) 40 MG capsule, Take 40 mg by mouth daily.,  Disp: , Rfl:  .  ONE TOUCH ULTRA TEST test strip, , Disp: , Rfl:  .  OneTouch Delica Lancets 44Y MISC, Use once daily to check blood sugars dx e11.9, Disp: , Rfl:  .  warfarin (COUMADIN) 1 MG tablet, Take 2.5 mg by mouth one time only at 4 PM. , Disp: , Rfl:   Physical exam:  Vitals:   09/01/20 1156  BP: (!) 146/72  Pulse: 82  Resp: 18  Temp: (!) 97.5 F (36.4 C)  TempSrc: Tympanic  SpO2: 98%  Weight: 139 lb (63 kg)   Physical Exam Constitutional:      General: She is not in acute distress. Cardiovascular:     Rate and Rhythm: Normal rate and regular rhythm.     Heart sounds: Normal heart sounds.  Pulmonary:     Effort: Pulmonary effort is normal.     Breath sounds: Normal breath sounds.  Abdominal:     General: Bowel sounds are normal.     Palpations: Abdomen is  soft.  Skin:    General: Skin is warm and dry.  Neurological:     Mental Status: She is alert and oriented to person, place, and time.      CMP Latest Ref Rng & Units 05/05/2020  Glucose 70 - 99 mg/dL 192(H)  BUN 6 - 20 mg/dL 19  Creatinine 0.44 - 1.00 mg/dL 0.84  Sodium 135 - 145 mmol/L 136  Potassium 3.5 - 5.1 mmol/L 4.4  Chloride 98 - 111 mmol/L 97(L)  CO2 22 - 32 mmol/L 28  Calcium 8.9 - 10.3 mg/dL 9.4  Total Protein 6.5 - 8.1 g/dL 7.5  Total Bilirubin 0.3 - 1.2 mg/dL 0.9  Alkaline Phos 38 - 126 U/L 93  AST 15 - 41 U/L 73(H)  ALT 0 - 44 U/L 82(H)   CBC Latest Ref Rng & Units 09/01/2020  WBC 4.0 - 10.5 K/uL 12.2(H)  Hemoglobin 12.0 - 15.0 g/dL 14.1  Hematocrit 36.0 - 46.0 % 42.6  Platelets 150 - 400 K/uL 324   None  Assessment and plan- Patient is a 59 y.o. female who is here for follow-up of following issues:  1.  Leukocytosis mainly neutrophilia and lymphocytosis. Flow cytometry and BCR ABL testing in the past has been negative.  White count is stable between 11-14 over the last 5 years.  Suspect this is reactive  2. Iron deficiency anemia patient's hemoglobin was 10.1 back in August 2021  when she received IV iron presently normal at 14.1/42.6.  Iron studies show ferritin level of 80 and normal iron saturation.  She does not require any IV iron at this time.  Repeat CBC ferritin and iron studies in 4 and 8 months and I will see her back in 8 months   Visit Diagnosis 1. Iron deficiency anemia, unspecified iron deficiency anemia type   2. Leukocytosis, unspecified type      Dr. Archana Rao, MD, MPH [facility] [phone] 09/01/2020 9:16 PM                

## 2020-09-02 NOTE — Telephone Encounter (Signed)
I called the pt's mother and got her voicemail and let her know that the patient labs were good. He white blood count has been between 11 to 14 for 5 years. Md feels it is reactive and nothing to worry about but we will check labs at 4 and 8 month. The ferritin was 80 and that is good and the iron results was good also. She does not need iron infusions and will have labs in 4 months and 8 months labs and then see md and mother can call me if she has questions

## 2020-09-09 ENCOUNTER — Ambulatory Visit
Admission: RE | Admit: 2020-09-09 | Discharge: 2020-09-09 | Disposition: A | Discharge status: Home / Self Care | Payer: PPO | Source: Ambulatory Visit | Attending: Internal Medicine | Admitting: Internal Medicine

## 2020-09-09 ENCOUNTER — Other Ambulatory Visit: Payer: Self-pay

## 2020-09-09 DIAGNOSIS — Z1231 Encounter for screening mammogram for malignant neoplasm of breast: Secondary | ICD-10-CM | POA: Diagnosis not present

## 2020-09-14 DIAGNOSIS — Z7901 Long term (current) use of anticoagulants: Secondary | ICD-10-CM | POA: Diagnosis not present

## 2020-09-26 DIAGNOSIS — Z7901 Long term (current) use of anticoagulants: Secondary | ICD-10-CM | POA: Diagnosis not present

## 2020-10-05 DIAGNOSIS — Z7901 Long term (current) use of anticoagulants: Secondary | ICD-10-CM | POA: Diagnosis not present

## 2020-10-11 DIAGNOSIS — I10 Essential (primary) hypertension: Secondary | ICD-10-CM | POA: Diagnosis not present

## 2020-10-11 DIAGNOSIS — E7801 Familial hypercholesterolemia: Secondary | ICD-10-CM | POA: Diagnosis not present

## 2020-10-11 DIAGNOSIS — I350 Nonrheumatic aortic (valve) stenosis: Secondary | ICD-10-CM | POA: Diagnosis not present

## 2020-10-11 DIAGNOSIS — Z952 Presence of prosthetic heart valve: Secondary | ICD-10-CM | POA: Diagnosis not present

## 2020-10-11 DIAGNOSIS — Z950 Presence of cardiac pacemaker: Secondary | ICD-10-CM | POA: Diagnosis not present

## 2020-10-12 ENCOUNTER — Other Ambulatory Visit: Payer: Self-pay

## 2020-10-13 ENCOUNTER — Encounter: Payer: Self-pay | Admitting: Gastroenterology

## 2020-10-13 ENCOUNTER — Other Ambulatory Visit: Payer: Self-pay

## 2020-10-13 ENCOUNTER — Ambulatory Visit: Payer: PPO | Admitting: Gastroenterology

## 2020-10-13 VITALS — BP 142/81 | HR 81 | Temp 98.0°F | Ht 62.0 in | Wt 141.2 lb

## 2020-10-13 DIAGNOSIS — K760 Fatty (change of) liver, not elsewhere classified: Secondary | ICD-10-CM | POA: Diagnosis not present

## 2020-10-13 NOTE — Progress Notes (Signed)
Brianna Darby, MD [address]  [place], [place] [address]  Main: [phone]  Fax: [phone]    Gastroenterology Consultation  Referring Provider:     [name], MD Primary Care Physician:  [name], MD Primary Gastroenterologist:  Dr. Cephas Harris Reason for Consultation:    Fatty liver        HPI:   Brianna Harris is a 60 y.o. female referred by Dr. Edwina Barth, Chrystie Nose, MD  for consultation & management of iron deficiency anemia.  Patient is chronic iron deficiency anemia of unclear etiology.  Patient has history of aortic valve stenosis s/p AVR on 02/14/2018 at [facility], has been maintained on Coumadin since then, s/p pacemaker due to high-grade heart block perioperatively.  Review of records revealed that she has anemia for last 2 years.  Lowest hemoglobin was 9.5, does have severe iron deficiency, serum ferritin 6 in 11/2019.  Patient has seen Dr. Janese Banks for this, received 2 iron infusions.  Most recent hemoglobin was 10.1, MCV 69.4 in 11/2019.  Patient reports that her energy levels are improving.  She continues to have craving for ice.  She quit smoking since her valve replacement.  Patient is accompanied by an interpreter as well as her mom.  Patient denies any GI symptoms today.  She reports having 1-2 soft bowel movements which is normal for her.  She denies any rectal bleeding  Follow-up visit 04/07/2020 Patient is with no complaints today.  She underwent iron infusions and her iron deficiency anemia has resolved.  Her iron levels are at goal.  She underwent upper endoscopy as well as colonoscopy, upper endoscopy revealed focal intestinal metaplasia only and colonoscopy revealed nonbleeding medium-sized AVM in the cecum which was treated with APC.  No evidence of H. pylori.  Patient has elevated LFTs AST, ALT and alkaline phosphatase, 1-2 times upper limit of normal since July 2021.  She does have mildly elevated hemoglobin A1c, being treated  for diabetes with Metformin.  Patient eats out at restaurants almost on a daily basis, her diet consists of mostly high-calorie foods  Follow-up visit 06/09/2020 Patient is here for follow-up of her elevated liver enzymes.  She has fatty liver as well as worsening of hemoglobin A1c.  Patient is here with her mom today.  Her mom is concerned about her poor eating habits which is contributing to worsened diabetes.  She does eat ice cream, brownies, fried foods every day.  She is also on Coumadin, cannot eat leafy greens. Her LFTs are persistently elevated.  Patient underwent secondary liver disease work-up which revealed mildly elevated serum ferritin levels, HCV RNA undetected, ANA positive.  Patient leads a sedentary lifestyle  Follow-up visit 10/13/2020 Patient is here for follow-up of elevated LFTs.  Patient is accompanied by her mom who accounted her daughter for dietary modification and regular physical activity.  She lost about 5 pounds.  Her transaminases have mildly improved within last 6 months.  Her AST 55, ALT 66 on 08/11/2020 at her PCP's office in [place] clinic.  She continues to drink Coke every day.  She does not have any other concerns today  Hepatic Function Latest Ref Rng & Units 05/05/2020 04/07/2020 12/17/2019  Total Protein 6.5 - 8.1 g/dL 7.5 7.0 7.7  Albumin 3.5 - 5.0 g/dL 4.1 4.4 4.1  AST 15 - 41 U/L 73(H) 92(H) 42(H)  ALT 0 - 44 U/L 82(H) 98(H) 35  Alk Phosphatase 38 - 126 U/L  93 135(H) 118  Total Bilirubin 0.3 - 1.2 mg/dL 0.9 0.7 0.5  Bilirubin, Direct 0.00 - 0.40 mg/dL - 0.17 -     NSAIDs: None  Antiplts/Anticoagulants/Anti thrombotics: Coumadin for history of AVR  GI Procedures:   EGD and colonoscopy 02/02/2020 - Normal examined duodenum. Biopsied. - Erythematous mucosa in the gastric body and antrum. Biopsied. - Esophagogastric landmarks identified. - Normal gastroesophageal junction and esophagus.  - The examined portion of the ileum was normal. -  Diverticulosis in the sigmoid colon. - A single non-bleeding colonic angioectasia. Treated with argon plasma coagulation (APC). - The distal rectum and anal verge are normal on retroflexion view. - No specimens collected.  DIAGNOSIS:  A. DUODENUM; COLD BIOPSY:  - ENTERIC MUCOSA WITH PRESERVED VILLOUS ARCHITECTURE AND NO SIGNIFICANT  HISTOPATHOLOGIC CHANGE.  - NEGATIVE FOR FEATURES OF CELIAC, DYSPLASIA, AND MALIGNANCY.   B. STOMACH; COLD BIOPSY:  - GASTRIC ANTRAL AND OXYNTIC MUCOSA WITH CHRONIC GASTRITIS WITH FOCAL  ACTIVITY.  - INTESTINAL METAPLASIA IS PRESENT.  - NEGATIVE FOR H. PYLORI, DYSPLASIA, AND MALIGNANCY.   Comment:  Due to the presence of focal active inflammation, an immunohistochemical  study directed against H. pylori was performed and is negative.   EGD and colonoscopy 08/17/2016 - Normal esophagus. - Small hiatal hernia. - Gastritis. Biopsied. - Normal examined duodenum.  - One diminutive polyp in the rectum, removed with a jumbo cold forceps. Resected and retrieved. - One 10 mm polyp in the sigmoid colon, removed with a hot snare. Resected and retrieved. - Internal hemorrhoids. - The examination was otherwise normal.  DIAGNOSIS:  A. STOMACH, ANTRUM AND BODY; COLD BIOPSY:  - OXYNTIC MUCOSA WITH MODERATE CHRONIC GASTRITIS AND FOCAL INTESTINAL  METAPLASIA.  - IMMUNOHISTOCHEMICAL STAIN FOR H. PYLORI WILL BE REPORTED IN AN  ADDENDUM.  - NEGATIVE FOR DYSPLASIA AND MALIGNANCY.   B. RECTUM POLYP; COLD BIOPSY:  - HYPERPLASTIC POLYP.  - NEGATIVE FOR DYSPLASIA AND MALIGNANCY.   C. COLON POLYP, SIGMOID; HOT SNARE:  - HYPERPLASTIC POLYP.  - NEGATIVE FOR DYSPLASIA AND MALIGNANCY.   ADDENDUM:  An immunohistochemical stain for H. pylori was performed and is  negative. Stain controls worked appropriately.   Past Medical History:  Diagnosis Date   Asthma    Deafness    Depression    Diabetes mellitus without complication (HCC)    GERD (gastroesophageal reflux  disease)    Heart abnormality    per mother pt has bad heart murmor- sees Dr Ubaldo Glassing    Hyperlipidemia    Hypertension     Past Surgical History:  Procedure Laterality Date   ABDOMINAL HYSTERECTOMY  2000   per mother [facility] thought pt cancer and did hyster ,cervix and ovaries   CHOLECYSTECTOMY     COLONOSCOPY WITH PROPOFOL N/A 08/17/2016   Procedure: COLONOSCOPY WITH PROPOFOL;  Surgeon: Manya Silvas, MD;  Location: [facility] ENDOSCOPY;  Service: Endoscopy;  Laterality: N/A;   COLONOSCOPY WITH PROPOFOL N/A 02/02/2020   Procedure: COLONOSCOPY WITH PROPOFOL;  Surgeon: [name], MD;  Location: [facility] ENDOSCOPY;  Service: Gastroenterology;  Laterality: N/A;   ESOPHAGOGASTRODUODENOSCOPY (EGD) WITH PROPOFOL N/A 08/17/2016   Procedure: ESOPHAGOGASTRODUODENOSCOPY (EGD) WITH PROPOFOL;  Surgeon: Manya Silvas, MD;  Location: [facility] ENDOSCOPY;  Service: Endoscopy;  Laterality: N/A;   ESOPHAGOGASTRODUODENOSCOPY (EGD) WITH PROPOFOL  02/02/2020   Procedure: ESOPHAGOGASTRODUODENOSCOPY (EGD) WITH PROPOFOL;  Surgeon: [name], MD;  Location: [facility] ENDOSCOPY;  Service: Gastroenterology;;   PACEMAKER IMPLANT  01/2018    Current Outpatient Medications:  amitriptyline (ELAVIL) 25 MG tablet, Take 25 mg by mouth at bedtime. , Disp: , Rfl:    amLODipine (NORVASC) 5 MG tablet, Take 2.5 mg by mouth in the morning and at bedtime. , Disp: , Rfl:    atorvastatin (LIPITOR) 40 MG tablet, Take 40 mg by mouth daily. , Disp: , Rfl:    Blood Glucose Monitoring Suppl (ONE TOUCH ULTRA 2) w/Device KIT, See admin instructions., Disp: , Rfl:    FLUoxetine (PROZAC) 10 MG capsule, Take by mouth., Disp: , Rfl:    HYDROcodone-acetaminophen (NORCO/VICODIN) 5-325 MG tablet, Take one tablet at night for pain; may take up to every 6 hours as needed for pain if not working or driving, Disp: , Rfl:    lamoTRIgine (LAMICTAL) 100 MG tablet, Take 1 tablet by mouth 2 (two) times daily., Disp: , Rfl:    Lancets (ONETOUCH DELICA  PLUS [id number]) MISC, , Disp: , Rfl:    metoprolol tartrate (LOPRESSOR) 25 MG tablet, Take 25 mg by mouth 2 (two) times daily. , Disp: , Rfl:    NYSTATIN powder, , Disp: , Rfl:    omeprazole (PRILOSEC) 40 MG capsule, Take 40 mg by mouth daily., Disp: , Rfl:    ONE TOUCH ULTRA TEST test strip, , Disp: , Rfl:    OneTouch Delica Lancets 22G MISC, Use once daily to check blood sugars dx e11.9, Disp: , Rfl:    traMADol (ULTRAM) 50 MG tablet, , Disp: , Rfl:    warfarin (COUMADIN) 1 MG tablet, Take 2.5 mg by mouth one time only at 4 PM. , Disp: , Rfl:    Family History  Problem Relation Age of Onset   Diabetes Maternal Uncle    Heart attack Father    Kidney cancer Paternal Uncle    Breast cancer Neg Hx    Bladder Cancer Neg Hx      Social History   Tobacco Use   Smoking status: Former    Packs/day: 1.50    Years: 39.00    Pack years: 58.50    Types: Cigarettes    Quit date: 02/13/2018    Years since quitting: 2.6   Smokeless tobacco: Never  Vaping Use   Vaping Use: Former  Substance Use Topics   Alcohol use: Not Currently   Drug use: Yes    Types: Marijuana, "Crack" cocaine    Comment: smoked 3 yr ago,for 2 y. Hx of cocaine use     Allergies as of 10/13/2020   (No Known Allergies)    Review of Systems:    All systems reviewed and negative except where noted in HPI.   Physical Exam:  BP (!) 142/81 (BP Location: Left Arm, Patient Position: Sitting, Cuff Size: Normal)   Pulse 81   Temp 98 F (36.7 C) (Oral)   Ht '5\' 2"'  (1.575 m)   Wt 141 lb 4 oz (64.1 kg)   BMI 25.83 kg/m  No LMP recorded. Patient has had a hysterectomy.  General:   Alert,  Well-developed, well-nourished, pleasant and cooperative in NAD Head:  Normocephalic and atraumatic. Eyes:  Sclera clear, no icterus.   Conjunctiva pink. Ears:  Normal auditory acuity. Nose:  No deformity, discharge, or lesions. Mouth:  No deformity or lesions,oropharynx pink & moist. Neck:  Supple; no masses or  thyromegaly. Lungs:  Respirations even and unlabored.  Clear throughout to auscultation.   No wheezes, crackles, or rhonchi. No acute distress. Heart:  Regular rate and rhythm; no murmurs, clicks, rubs, or gallops. Abdomen:  Normal bowel sounds. Soft, obese, non-tender and non-distended without masses, hepatosplenomegaly or hernias noted.  No guarding or rebound tenderness.   Rectal: Not performed Msk:  Symmetrical without gross deformities. Good, equal movement & strength bilaterally. Pulses:  Normal pulses noted. Extremities:  No clubbing or edema.  No cyanosis. Neurologic:  Alert and oriented x3;  grossly normal neurologically. Skin:  Intact without significant lesions or rashes. No jaundice. Psych:  Alert and cooperative. Normal mood and affect.  Imaging Studies: Fatty liver based on CT in 11/2013  Assessment and Plan:   YAILYN STRACK is a 60 y.o. female with metabolic syndrome, bicuspid aortic valve s/p valve replacement in October 2019, s/p pacemaker on Coumadin is seen in for follow-up of chronic iron deficiency anemia and elevated LFTs  Chronic iron deficiency anemia likely secondary to chronic blood loss from large bowel AVM Upper endoscopy revealed focal intestinal metaplasia, no evidence of H. pylori.  No evidence of celiac disease.  Colonoscopy revealed AVM in the cecum which was treated with APC.  Since, iron deficiency anemia has resolved, will defer video capsule endoscopy at this time.  Elevated LFTs: Etiology is probably secondary to fatty liver from metabolic syndrome right upper quadrant ultrasound revealed hepatic steatosis secondary liver disease work-up revealed positive ANA only which can be elevated in setting of fatty liver Today, I have discussed in length regarding healthy eating habits, walking at least 5000-10,000 steps daily Patient did not see a dietitian Recheck LFTs in 6 months, if persistently elevated, recommend liver biopsy Tight control of  diabetes   Follow up in 6 months   Brianna Darby, MD

## 2020-10-20 DIAGNOSIS — Z7901 Long term (current) use of anticoagulants: Secondary | ICD-10-CM | POA: Diagnosis not present

## 2020-11-02 DIAGNOSIS — H2513 Age-related nuclear cataract, bilateral: Secondary | ICD-10-CM | POA: Diagnosis not present

## 2020-11-10 DIAGNOSIS — H2512 Age-related nuclear cataract, left eye: Secondary | ICD-10-CM | POA: Diagnosis not present

## 2020-11-17 DIAGNOSIS — Z7901 Long term (current) use of anticoagulants: Secondary | ICD-10-CM | POA: Diagnosis not present

## 2020-12-01 DIAGNOSIS — M25511 Pain in right shoulder: Secondary | ICD-10-CM | POA: Diagnosis not present

## 2020-12-01 DIAGNOSIS — R569 Unspecified convulsions: Secondary | ICD-10-CM | POA: Diagnosis not present

## 2020-12-04 ENCOUNTER — Emergency Department
Admission: EM | Admit: 2020-12-04 | Discharge: 2020-12-04 | Disposition: A | Discharge status: Home / Self Care | Payer: PPO | Attending: Emergency Medicine | Admitting: Emergency Medicine

## 2020-12-04 ENCOUNTER — Other Ambulatory Visit: Payer: Self-pay

## 2020-12-04 DIAGNOSIS — H43392 Other vitreous opacities, left eye: Secondary | ICD-10-CM | POA: Diagnosis not present

## 2020-12-04 DIAGNOSIS — J4540 Moderate persistent asthma, uncomplicated: Secondary | ICD-10-CM | POA: Diagnosis not present

## 2020-12-04 DIAGNOSIS — E119 Type 2 diabetes mellitus without complications: Secondary | ICD-10-CM | POA: Insufficient documentation

## 2020-12-04 DIAGNOSIS — I10 Essential (primary) hypertension: Secondary | ICD-10-CM | POA: Insufficient documentation

## 2020-12-04 DIAGNOSIS — Z950 Presence of cardiac pacemaker: Secondary | ICD-10-CM | POA: Diagnosis not present

## 2020-12-04 DIAGNOSIS — Z79899 Other long term (current) drug therapy: Secondary | ICD-10-CM | POA: Insufficient documentation

## 2020-12-04 DIAGNOSIS — Z87891 Personal history of nicotine dependence: Secondary | ICD-10-CM | POA: Diagnosis not present

## 2020-12-04 DIAGNOSIS — Z7901 Long term (current) use of anticoagulants: Secondary | ICD-10-CM | POA: Diagnosis not present

## 2020-12-04 DIAGNOSIS — H5712 Ocular pain, left eye: Secondary | ICD-10-CM | POA: Diagnosis present

## 2020-12-04 LAB — CBG MONITORING, ED: Glucose-Capillary: 167 mg/dL — ABNORMAL HIGH (ref 70–99)

## 2020-12-04 NOTE — Discharge Instructions (Addendum)
You have floaters in your eye.  Typically this is a defect in the gel of the eye or a drop of blood that enters the gel in the eye itself.  This can cause a visual disturbance and it does not resolve, typically these are permanent.  Eventually you will no longer be able to identify this as the brain will stop "seeing" them.  However if you intently look you will still feel to find these.  Floaters are typically a benign condition.  Given your pending surgery I would recommend talking to ophthalmology so they can perform a detailed eye exam prior to having a surgical procedure.  Concern with floaters is that it can be a precursor to a retinal detachment.  Your retina is important with your vision and if it detaches you can lose your vision if not treated promptly.  If you have any loss of vision in this eye seek medical care immediately.

## 2020-12-04 NOTE — ED Triage Notes (Addendum)
Pt is deaf. Family with pt at this time. Pt comes with c/o left eye floaters. Pt states this started two days ago. Pt went to [facility] and was sent here to be evaluated.   Pt is diabetic and hasn't been checking sugars.  Pt denies any headache or other symptoms.

## 2020-12-04 NOTE — ED Provider Notes (Signed)
[facility] Emergency Department Provider Note  ____________________________________________  Time seen: Approximately 4:37 PM  I have reviewed the triage vital signs and the nursing notes.   HISTORY  Chief Complaint Eye Pain  Interpreter was used.  Patient is deaf and ASL interpreter on the iPad was used  HPI Brianna Harris is a 60 y.o. female who presents the emergency department complaining of floaters in the left eye.  Patient states that this started 9 days ago.  She states that there are 2 spots that she can see.  There is no pain, no vision changes.  She is scheduled to have cataract surgery in the left eye within the month and wanted to ensure that this would not affect her cataract surgery.  She denied any headache, numbness or tingling in the extremities.  No trauma to the head or face.  Patient states that she does not wear glasses or contacts.  Patient does have a history of diabetes, hypertension, is deaf, has a history of asthma       Past Medical History:  Diagnosis Date   Asthma    Deafness    Depression    Diabetes mellitus without complication ([place])    GERD (gastroesophageal reflux disease)    Heart abnormality    per mother pt has bad heart murmor- sees Dr Ubaldo Glassing    Hyperlipidemia    Hypertension     Patient Active Problem List   Diagnosis Date Noted   AVM (arteriovenous malformation) of colon without hemorrhage    Iron deficiency anemia 12/24/2019   Fatty liver 12/11/2019   Aortic atherosclerosis ([place]) 03/24/2019   Cardiac pacemaker in situ 06/09/2018   Post-surgical complete heart block ([place]) 06/09/2018   Decreased activities of daily living (ADL) 02/17/2018   H/O partial seizures 02/17/2018   Junctional bradycardia 02/17/2018   Anticoagulated on Coumadin 02/16/2018   Oliguria 02/16/2018   Red blood cell antibody positive, compatible PRBC difficult to obtain 02/14/2018   S/P AVR (aortic valve replacement) 02/14/2018    History of cocaine abuse ([place]) 01/20/2018   On statin therapy 01/20/2018   Overactive bladder 08/27/2016   Hyperlipidemia, unspecified 08/27/2016   Drug abuse ([place]) 08/27/2016   Aortic valve stenosis 08/03/2016   Asthma without status asthmaticus 07/19/2016   Diabetes mellitus type 2, uncomplicated ([place]) 83/15/1761   Elevated LFTs 05/21/2016   Colon cancer screening 05/21/2016   Alcoholism /alcohol abuse 05/21/2016   Lipid disorder 09/16/2011   Chronic depression 09/12/2011   Tobacco abuse 09/12/2011   OAB (overactive bladder) 09/12/2011   Crack cocaine use 09/12/2011   Back pain 09/12/2011   Asthma, moderate persistent 09/12/2011   Deafness 05/01/2011   Hypertension 05/01/2011   GERD (gastroesophageal reflux disease) 05/01/2011    Past Surgical History:  Procedure Laterality Date   ABDOMINAL HYSTERECTOMY  2000   per mother [facility] thought pt cancer and did hyster ,cervix and ovaries   CHOLECYSTECTOMY     COLONOSCOPY WITH PROPOFOL N/A 08/17/2016   Procedure: COLONOSCOPY WITH PROPOFOL;  Surgeon: Manya Silvas, MD;  Location: [facility] ENDOSCOPY;  Service: Endoscopy;  Laterality: N/A;   COLONOSCOPY WITH PROPOFOL N/A 02/02/2020   Procedure: COLONOSCOPY WITH PROPOFOL;  Surgeon: [name], MD;  Location: [facility] ENDOSCOPY;  Service: Gastroenterology;  Laterality: N/A;   ESOPHAGOGASTRODUODENOSCOPY (EGD) WITH PROPOFOL N/A 08/17/2016   Procedure: ESOPHAGOGASTRODUODENOSCOPY (EGD) WITH PROPOFOL;  Surgeon: Manya Silvas, MD;  Location: [facility] ENDOSCOPY;  Service: Endoscopy;  Laterality: N/A;   ESOPHAGOGASTRODUODENOSCOPY (EGD) WITH PROPOFOL  02/02/2020   Procedure: ESOPHAGOGASTRODUODENOSCOPY (EGD) WITH PROPOFOL;  Surgeon: [name], MD;  Location: [facility];  Service: Gastroenterology;;   PACEMAKER IMPLANT  01/2018    Prior to Admission medications   Medication Sig Start Date End Date Taking? Authorizing Provider  amitriptyline (ELAVIL) 25 MG tablet Take 25 mg by mouth at  bedtime.  05/04/19   [provider]  amLODipine (NORVASC) 5 MG tablet Take 2.5 mg by mouth in the morning and at bedtime.  12/06/16   [provider]  atorvastatin (LIPITOR) 40 MG tablet Take 40 mg by mouth daily.  10/19/19   [provider]  Blood Glucose Monitoring Suppl (ONE TOUCH ULTRA 2) w/Device KIT See admin instructions. 10/02/19   [provider]  FLUoxetine (PROZAC) 10 MG capsule Take by mouth. 05/19/20 05/19/21  [provider]  HYDROcodone-acetaminophen (NORCO/VICODIN) 5-325 MG tablet Take one tablet at night for pain; may take up to every 6 hours as needed for pain if not working or driving 11/22/97   [provider]  lamoTRIgine (LAMICTAL) 100 MG tablet Take 1 tablet by mouth 2 (two) times daily. 06/10/20 06/10/21  [provider]  Lancets (ONETOUCH DELICA PLUS [id number]) [place]  10/05/19   [provider]  metoprolol tartrate (LOPRESSOR) 25 MG tablet Take 25 mg by mouth 2 (two) times daily.  05/04/19   [provider]  NYSTATIN powder  07/22/20   [provider]  omeprazole (PRILOSEC) 40 MG capsule Take 40 mg by mouth daily. 01/11/20   [provider]  ONE TOUCH ULTRA TEST test strip  05/08/16   [provider]  OneTouch Delica Lancets 38B MISC Use once daily to check blood sugars dx e11.9 10/05/19   [provider]  traMADol Veatrice Bourbon) 50 MG tablet  07/08/20   [provider]  warfarin (COUMADIN) 1 MG tablet Take 2.5 mg by mouth one time only at 4 PM.  05/25/19   [provider]    Allergies Patient has no known allergies.  Family History  Problem Relation Age of Onset   Diabetes Maternal Uncle    Heart attack Father    Kidney cancer Paternal Uncle    Breast cancer Neg Hx    Bladder Cancer Neg Hx     Social History Social History   Tobacco Use   Smoking status: Former    Packs/day: 1.50    Years: 39.00    Pack years: 58.50    Types: Cigarettes    Quit  date: 02/13/2018    Years since quitting: 2.8   Smokeless tobacco: Never  Vaping Use   Vaping Use: Former  Substance Use Topics   Alcohol use: Not Currently   Drug use: Yes    Types: Marijuana, "Crack" cocaine    Comment: smoked 3 yr ago,for 2 y. Hx of cocaine use      Review of Systems  Constitutional: No fever/chills Eyes: 2 floaters in the left eye.  Patient does not wear glasses or contacts.  No loss of vision.. No discharge ENT: No upper respiratory complaints. Cardiovascular: no chest pain. Respiratory: no cough. No SOB. Gastrointestinal: No abdominal pain.  No nausea, no vomiting.  No diarrhea.  No constipation. Musculoskeletal: Negative for musculoskeletal pain. Skin: Negative for rash, abrasions, lacerations, ecchymosis. Neurological: Negative for headaches, focal weakness or numbness.  10 System ROS otherwise negative.  ____________________________________________   PHYSICAL EXAM:  VITAL SIGNS: ED Triage Vitals  Enc Vitals Group     BP 12/04/20 1508 Marland Kitchen)  167/89     Pulse Rate 12/04/20 1508 78     Resp 12/04/20 1508 20     Temp 12/04/20 1508 98 F (36.7 C)     Temp Source 12/04/20 1508 Oral     SpO2 12/04/20 1508 98 %     Weight --      Height --      Head Circumference --      Peak Flow --      Pain Score 12/04/20 1455 0     Pain Loc --      Pain Edu? --      Excl. in [place]? --      Constitutional: Alert and oriented. Well appearing and in no acute distress. Eyes: Conjunctivae are normal. PERRL. EOMI. funduscopic exam of bilateral eyes reveals vasculature and optic disc is visualized and is largely unremarkable.  Evaluation of the retina does not reveal any evidence of detachment.  Patient does have a cataract in the left eye which is appreciated on physical exam. Head: Atraumatic. ENT:      Ears:       Nose: No congestion/rhinnorhea.      Mouth/Throat: Mucous membranes are moist.  Neck: No stridor.    Cardiovascular: Normal rate, regular rhythm.  Normal S1 and S2.  Good peripheral circulation. Respiratory: Normal respiratory effort without tachypnea or retractions. Lungs CTAB. Good air entry to the bases with no decreased or absent breath sounds. Musculoskeletal: Full range of motion to all extremities. No gross deformities appreciated. Neurologic:  Normal speech and language. No gross focal neurologic deficits are appreciated.  Skin:  Skin is warm, dry and intact. No rash noted. Psychiatric: Mood and affect are normal. Speech and behavior are normal. Patient exhibits appropriate insight and judgement.   ____________________________________________   LABS (all labs ordered are listed, but only abnormal results are displayed)  Labs Reviewed  CBG MONITORING, ED - Abnormal; Notable for the following components:      Result Value   Glucose-Capillary 167 (*)    All other components within normal limits   ____________________________________________  EKG   ____________________________________________  RADIOLOGY   No results found.  ____________________________________________    PROCEDURES  Procedure(s) performed:    Procedures    Medications - No data to display   ____________________________________________   INITIAL IMPRESSION / ASSESSMENT AND PLAN / ED COURSE  Pertinent labs & imaging results that were available during my care of the patient were reviewed by me and considered in my medical decision making (see chart for details).  Review of the [place] CSRS was performed in accordance of the [place] prior to dispensing any controlled drugs.           Patient's diagnosis is consistent with floaters.  Patient presented to the emergency department complaining of spots that developed in her left eye.  Nontraumatic, nonpainful.  There are 2 spots to the left eye.  Physical exam was reassuring.  There was no loss of extraocular motion, there was no pain to the eye, there was no neuro symptoms, funduscopic exam is  reassuring.  This time I recommended follow-up with her ophthalmologist.  She is scheduled for cataract surgery and I recommended advising them of these new floaters prior to surgical intervention.  Strict return precautions are discussed with the patient and a friend that the patient has with her at this time.  Patient verbalizes understanding.  Follow-up with ophthalmology. Patient is given ED precautions to return to the ED for any worsening or  new symptoms.     ____________________________________________  FINAL CLINICAL IMPRESSION(S) / ED DIAGNOSES  Final diagnoses:  Visual floaters, left      NEW MEDICATIONS STARTED DURING THIS VISIT:  ED Discharge Orders     None           This chart was dictated using voice recognition software/Dragon. Despite best efforts to proofread, errors can occur which can change the meaning. Any change was purely unintentional.    Darletta Moll, PA-C 12/04/20 1654    Blake Divine, MD 12/05/20 1515

## 2020-12-04 NOTE — ED Notes (Signed)
CBG 167 

## 2020-12-06 DIAGNOSIS — R569 Unspecified convulsions: Secondary | ICD-10-CM | POA: Diagnosis not present

## 2020-12-06 DIAGNOSIS — E119 Type 2 diabetes mellitus without complications: Secondary | ICD-10-CM | POA: Diagnosis not present

## 2020-12-12 DIAGNOSIS — H43812 Vitreous degeneration, left eye: Secondary | ICD-10-CM | POA: Diagnosis not present

## 2020-12-13 DIAGNOSIS — Z952 Presence of prosthetic heart valve: Secondary | ICD-10-CM | POA: Diagnosis not present

## 2020-12-13 DIAGNOSIS — K760 Fatty (change of) liver, not elsewhere classified: Secondary | ICD-10-CM | POA: Diagnosis not present

## 2020-12-13 DIAGNOSIS — E119 Type 2 diabetes mellitus without complications: Secondary | ICD-10-CM | POA: Diagnosis not present

## 2020-12-13 DIAGNOSIS — E7801 Familial hypercholesterolemia: Secondary | ICD-10-CM | POA: Diagnosis not present

## 2020-12-13 DIAGNOSIS — F32A Depression, unspecified: Secondary | ICD-10-CM | POA: Diagnosis not present

## 2020-12-13 DIAGNOSIS — I10 Essential (primary) hypertension: Secondary | ICD-10-CM | POA: Diagnosis not present

## 2020-12-14 DIAGNOSIS — Z7901 Long term (current) use of anticoagulants: Secondary | ICD-10-CM | POA: Diagnosis not present

## 2020-12-15 ENCOUNTER — Ambulatory Visit: Admission: RE | Admit: 2020-12-15 | Payer: PPO | Source: Home / Self Care | Admitting: Ophthalmology

## 2020-12-15 ENCOUNTER — Encounter: Admission: RE | Payer: Self-pay | Source: Home / Self Care

## 2020-12-15 ENCOUNTER — Telehealth: Payer: Self-pay | Admitting: Gastroenterology

## 2020-12-15 SURGERY — PHACOEMULSIFICATION, CATARACT, WITH IOL INSERTION
Anesthesia: Topical | Laterality: Left

## 2020-12-15 NOTE — Telephone Encounter (Signed)
Charlen's mother llvm and want to discuss her labs. Bania is hard of hearing. Please call [name] at [phone].

## 2020-12-15 NOTE — Telephone Encounter (Signed)
I have reviewed her labs, unfortunately her diabetes is getting worse as well as her enzymes have slightly worsened.  Other than getting her diabetes under control, healthy diet and exercise, there is no other treatment available at this time for fatty liver  Brianna Harris

## 2020-12-15 NOTE — Telephone Encounter (Signed)
Anaija had lab work done last week with Dr. Edwina Barth her PCP  She states her liver enzymes has elevated and her A1c has risen. She has made appointment with another diabetes specialist.

## 2020-12-15 NOTE — Telephone Encounter (Signed)
Patient mother verbalized understanding of results  °

## 2020-12-21 ENCOUNTER — Emergency Department
Admission: EM | Admit: 2020-12-21 | Discharge: 2020-12-21 | Disposition: A | Discharge status: Home / Self Care | Payer: PPO | Attending: Student in an Organized Health Care Education/Training Program | Admitting: Student in an Organized Health Care Education/Training Program

## 2020-12-21 ENCOUNTER — Emergency Department: Payer: PPO

## 2020-12-21 ENCOUNTER — Other Ambulatory Visit: Payer: Self-pay

## 2020-12-21 DIAGNOSIS — Z7901 Long term (current) use of anticoagulants: Secondary | ICD-10-CM | POA: Insufficient documentation

## 2020-12-21 DIAGNOSIS — R0789 Other chest pain: Secondary | ICD-10-CM | POA: Diagnosis not present

## 2020-12-21 DIAGNOSIS — J45909 Unspecified asthma, uncomplicated: Secondary | ICD-10-CM | POA: Diagnosis not present

## 2020-12-21 DIAGNOSIS — Z87891 Personal history of nicotine dependence: Secondary | ICD-10-CM | POA: Insufficient documentation

## 2020-12-21 DIAGNOSIS — I10 Essential (primary) hypertension: Secondary | ICD-10-CM | POA: Diagnosis not present

## 2020-12-21 DIAGNOSIS — Z7984 Long term (current) use of oral hypoglycemic drugs: Secondary | ICD-10-CM | POA: Diagnosis not present

## 2020-12-21 DIAGNOSIS — Z950 Presence of cardiac pacemaker: Secondary | ICD-10-CM | POA: Diagnosis not present

## 2020-12-21 DIAGNOSIS — R079 Chest pain, unspecified: Secondary | ICD-10-CM | POA: Diagnosis not present

## 2020-12-21 DIAGNOSIS — I517 Cardiomegaly: Secondary | ICD-10-CM | POA: Diagnosis not present

## 2020-12-21 DIAGNOSIS — Z79899 Other long term (current) drug therapy: Secondary | ICD-10-CM | POA: Diagnosis not present

## 2020-12-21 DIAGNOSIS — E119 Type 2 diabetes mellitus without complications: Secondary | ICD-10-CM | POA: Insufficient documentation

## 2020-12-21 DIAGNOSIS — K760 Fatty (change of) liver, not elsewhere classified: Secondary | ICD-10-CM | POA: Diagnosis not present

## 2020-12-21 LAB — BASIC METABOLIC PANEL
Anion gap: 8 (ref 5–15)
BUN: 13 mg/dL (ref 6–20)
CO2: 27 mmol/L (ref 22–32)
Calcium: 9.5 mg/dL (ref 8.9–10.3)
Chloride: 102 mmol/L (ref 98–111)
Creatinine, Ser: 0.89 mg/dL (ref 0.44–1.00)
GFR, Estimated: >60 mL/min (ref >60)
Glucose, Bld: 217 mg/dL — ABNORMAL HIGH (ref 70–99)
Potassium: 3.6 mmol/L (ref 3.5–5.1)
Sodium: 137 mmol/L (ref 135–145)

## 2020-12-21 LAB — CBC
HCT: 40.2 % (ref 36.0–46.0)
Hemoglobin: 13.6 g/dL (ref 12.0–15.0)
MCH: 29.9 pg (ref 26.0–34.0)
MCHC: 33.8 g/dL (ref 30.0–36.0)
MCV: 88.4 fL (ref 80.0–100.0)
Platelets: 282 10*3/uL (ref 150–400)
RBC: 4.55 MIL/uL (ref 3.87–5.11)
RDW: 13.4 % (ref 11.5–15.5)
WBC: 12 10*3/uL — ABNORMAL HIGH (ref 4.0–10.5)
nRBC: 0 % (ref 0.0–0.2)

## 2020-12-21 LAB — TROPONIN I (HIGH SENSITIVITY)
Troponin I (High Sensitivity): 8 ng/L (ref <18)
Troponin I (High Sensitivity): 9 ng/L (ref <18)

## 2020-12-21 MED ORDER — IOHEXOL 350 MG/ML SOLN
75.0000 mL | Freq: Once | INTRAVENOUS | Status: AC | PRN
  Administered 2020-12-21: 75 mL via INTRAVENOUS
  Filled 2020-12-21: qty 75

## 2020-12-21 NOTE — ED Provider Notes (Signed)
[facility] Emergency Department Provider Note    Event Date/Time   First MD Initiated Contact with Patient 12/21/20 1721     (approximate)  I have reviewed the triage vital signs and the nursing notes.   HISTORY  Chief Complaint Chest Pain and Oral Pain    HPI Brianna Harris is a 60 y.o. female below listed past medical history presents to the ER for evaluation of episode of chest pain as well as pain in her gums that occurred once last week and once this morning.  Does have a history of pacer as well as heart murmur follows with Dr. Ubaldo Glassing cardiology.  Denies any pain or pressure at this time.  Is never had pain like this before.  No recent fevers.  No nausea or vomiting.  Denies any abdominal pain.  Past Medical History:  Diagnosis Date   Asthma    Deafness    Depression    Diabetes mellitus without complication (HCC)    GERD (gastroesophageal reflux disease)    Heart abnormality    per mother pt has bad heart murmor- sees Dr Ubaldo Glassing    Hyperlipidemia    Hypertension    Family History  Problem Relation Age of Onset   Diabetes Maternal Uncle    Heart attack Father    Kidney cancer Paternal Uncle    Breast cancer Neg Hx    Bladder Cancer Neg Hx    Past Surgical History:  Procedure Laterality Date   ABDOMINAL HYSTERECTOMY  2000   per mother [facility] thought pt cancer and did hyster ,cervix and ovaries   CHOLECYSTECTOMY     COLONOSCOPY WITH PROPOFOL N/A 08/17/2016   Procedure: COLONOSCOPY WITH PROPOFOL;  Surgeon: Manya Silvas, MD;  Location: [facility] ENDOSCOPY;  Service: Endoscopy;  Laterality: N/A;   COLONOSCOPY WITH PROPOFOL N/A 02/02/2020   Procedure: COLONOSCOPY WITH PROPOFOL;  Surgeon: [name], MD;  Location: [facility] ENDOSCOPY;  Service: Gastroenterology;  Laterality: N/A;   ESOPHAGOGASTRODUODENOSCOPY (EGD) WITH PROPOFOL N/A 08/17/2016   Procedure: ESOPHAGOGASTRODUODENOSCOPY (EGD) WITH PROPOFOL;  Surgeon: Manya Silvas, MD;  Location:  [facility] ENDOSCOPY;  Service: Endoscopy;  Laterality: N/A;   ESOPHAGOGASTRODUODENOSCOPY (EGD) WITH PROPOFOL  02/02/2020   Procedure: ESOPHAGOGASTRODUODENOSCOPY (EGD) WITH PROPOFOL;  Surgeon: [name], MD;  Location: [facility] ENDOSCOPY;  Service: Gastroenterology;;   PACEMAKER IMPLANT  01/2018   Patient Active Problem List   Diagnosis Date Noted   AVM (arteriovenous malformation) of colon without hemorrhage    Iron deficiency anemia 12/24/2019   Fatty liver 12/11/2019   Aortic atherosclerosis ([place]) 03/24/2019   Cardiac pacemaker in situ 06/09/2018   Post-surgical complete heart block ([place]) 06/09/2018   Decreased activities of daily living (ADL) 02/17/2018   H/O partial seizures 02/17/2018   Junctional bradycardia 02/17/2018   Anticoagulated on Coumadin 02/16/2018   Oliguria 02/16/2018   Red blood cell antibody positive, compatible PRBC difficult to obtain 02/14/2018   S/P AVR (aortic valve replacement) 02/14/2018   History of cocaine abuse ([place]) 01/20/2018   On statin therapy 01/20/2018   Overactive bladder 08/27/2016   Hyperlipidemia, unspecified 08/27/2016   Drug abuse ([place]) 08/27/2016   Aortic valve stenosis 08/03/2016   Asthma without status asthmaticus 07/19/2016   Diabetes mellitus type 2, uncomplicated ([place]) 80/06/4915   Elevated LFTs 05/21/2016   Colon cancer screening 05/21/2016   Alcoholism /alcohol abuse 05/21/2016   Lipid disorder 09/16/2011   Chronic depression 09/12/2011   Tobacco abuse 09/12/2011   OAB (overactive bladder) 09/12/2011  Crack cocaine use 09/12/2011   Back pain 09/12/2011   Asthma, moderate persistent 09/12/2011   Deafness 05/01/2011   Hypertension 05/01/2011   GERD (gastroesophageal reflux disease) 05/01/2011      Prior to Admission medications   Medication Sig Start Date End Date Taking? Authorizing Provider  acetaminophen (TYLENOL) 500 MG tablet Take 1,000 mg by mouth every 6 (six) hours as needed for moderate pain.    [provider]  amitriptyline (ELAVIL) 25 MG tablet Take 25 mg by mouth at bedtime.  05/04/19   [provider]  atorvastatin (LIPITOR) 40 MG tablet Take 40 mg by mouth at bedtime. 10/19/19   [provider]  Blood Glucose Monitoring Suppl (ONE TOUCH ULTRA 2) w/Device KIT See admin instructions. 10/02/19   [provider]  lamoTRIgine (LAMICTAL) 100 MG tablet Take 100 mg by mouth 2 (two) times daily. 06/10/20 06/10/21  [provider]  Lancets (ONETOUCH DELICA PLUS [id number]) [place]  10/05/19   [provider]  metFORMIN (GLUCOPHAGE-XR) 500 MG 24 hr tablet Take 1,000 mg by mouth 2 (two) times daily. 09/16/20   [provider]  metoprolol tartrate (LOPRESSOR) 25 MG tablet Take 25 mg by mouth 2 (two) times daily.  05/04/19   [provider]  Multiple Vitamins-Minerals (MULTIVITAMIN WITH MINERALS) tablet Take 1 tablet by mouth daily. Adults    [provider]  ONE TOUCH ULTRA TEST test strip  05/08/16   [provider]  OneTouch Delica Lancets 39R MISC Use once daily to check blood sugars dx e11.9 10/05/19   [provider]  warfarin (COUMADIN) 1 MG tablet Take 3 mg by mouth See admin instructions. Take with 2 mg table for a total of 3 mg on Mon. Wed and Fri. 05/25/19   [provider]  warfarin (COUMADIN) 2 MG tablet Take 2 mg by mouth See admin instructions. Take with 0.5 table of 1 mg for a total of 2.5 mg Cain Saupe, Sat., and Sunday 11/30/20   [provider]    Allergies Patient has no known allergies.    Social History Social History   Tobacco Use   Smoking status: Former    Packs/day: 1.50    Years: 39.00    Pack years: 58.50    Types: Cigarettes    Quit date: 02/13/2018    Years since quitting: 2.8   Smokeless tobacco: Never  Vaping Use   Vaping Use: Former  Substance Use Topics   Alcohol use: Not Currently   Drug use: Yes    Types: Marijuana, "Crack" cocaine    Comment: smoked 3  yr ago,for 2 y. Hx of cocaine use     Review of Systems Patient denies headaches, rhinorrhea, blurry vision, numbness, shortness of breath, chest pain, edema, cough, abdominal pain, nausea, vomiting, diarrhea, dysuria, fevers, rashes or hallucinations unless otherwise stated above in HPI. ____________________________________________   PHYSICAL EXAM:  VITAL SIGNS: Vitals:   12/21/20 1543 12/21/20 1818  BP: (!) 153/82 (!) 150/82  Pulse: 80 90  Resp: 18 17  Temp: 98.8 F (37.1 C)   SpO2: 94% 99%    Constitutional: Alert and oriented.  Eyes: Conjunctivae are normal.  Head: Atraumatic. Nose: No congestion/rhinnorhea. Mouth/Throat: Mucous membranes are moist.   Neck: No stridor. Painless ROM.  Cardiovascular: Normal rate, regular rhythm. Good peripheral circulation. Respiratory: Normal respiratory effort.  No retractions. Lungs CTAB. Gastrointestinal: Soft and nontender. No distention. No abdominal bruits. No CVA tenderness. Genitourinary:  Musculoskeletal: No lower extremity tenderness nor  edema.  No joint effusions. Neurologic:   No gross focal neurologic deficits are appreciated. No facial droop Skin:  Skin is warm, dry and intact. No rash noted. Psychiatric: Mood and affect are normal. Speech and behavior are normal.  ____________________________________________   LABS (all labs ordered are listed, but only abnormal results are displayed)  Results for orders placed or performed during the hospital encounter of 12/21/20 (from the past 24 hour(s))  Basic metabolic panel     Status: Abnormal   Collection Time: 12/21/20  1:21 PM  Result Value Ref Range   Sodium 137 135 - 145 mmol/L   Potassium 3.6 3.5 - 5.1 mmol/L   Chloride 102 98 - 111 mmol/L   CO2 27 22 - 32 mmol/L   Glucose, Bld 217 (H) 70 - 99 mg/dL   BUN 13 6 - 20 mg/dL   Creatinine, Ser 0.89 0.44 - 1.00 mg/dL   Calcium 9.5 8.9 - 10.3 mg/dL   GFR, Estimated >60 >60 mL/min   Anion gap 8 5 - 15  CBC     Status:  Abnormal   Collection Time: 12/21/20  1:21 PM  Result Value Ref Range   WBC 12.0 (H) 4.0 - 10.5 K/uL   RBC 4.55 3.87 - 5.11 MIL/uL   Hemoglobin 13.6 12.0 - 15.0 g/dL   HCT 40.2 36.0 - 46.0 %   MCV 88.4 80.0 - 100.0 fL   MCH 29.9 26.0 - 34.0 pg   MCHC 33.8 30.0 - 36.0 g/dL   RDW 13.4 11.5 - 15.5 %   Platelets 282 150 - 400 K/uL   nRBC 0.0 0.0 - 0.2 %  Troponin I (High Sensitivity)     Status: None   Collection Time: 12/21/20  1:21 PM  Result Value Ref Range   Troponin I (High Sensitivity) 8 <18 ng/L  Troponin I (High Sensitivity)     Status: None   Collection Time: 12/21/20  4:04 PM  Result Value Ref Range   Troponin I (High Sensitivity) 9 <18 ng/L   ____________________________________________  EKG My review and personal interpretation at Time: 13:11   Indication: chest pain  Rate:80  Rhythm: as vp Axis: left Other: paced rhythm, abnml ekg ____________________________________________  RADIOLOGY  I personally reviewed all radiographic images ordered to evaluate for the above acute complaints and reviewed radiology reports and findings.  These findings were personally discussed with the patient.  Please see medical record for radiology report.  ____________________________________________   PROCEDURES  Procedure(s) performed:  Procedures    Critical Care performed: no ____________________________________________   INITIAL IMPRESSION / ASSESSMENT AND PLAN / ED COURSE  Pertinent labs & imaging results that were available during my care of the patient were reviewed by me and considered in my medical decision making (see chart for details).   DDX: ACS, pericarditis, esophagitis, boerhaaves, pe, dissection, pna, bronchitis, costochondritis   NYAIRA HODGENS is a 60 y.o. who presents to the ED with symptoms as described above.  Patient nontoxic-appearing symptoms happening last week as well as this morning but denying any pain or discomfort at this time.  Has paced  rhythm but troponin negative.  has reassuring exam.  No sign of odontogenic infection no sign of Ludwig's.  Given pain radiate up into her jaw with very bizarre presentation mild hypertension CTA will be ordered to rule out dissection.  Does not seem consistent with PE.  Blood work otherwise reassuring with mild leukocytosis which she chronically carries.  Clinical Course as of 12/21/20  1946  Wed Dec 21, 2020  1937 CT imaging is reassuring.  No clear explanation for the patient's episode of chest pain last week or pain in her gums.  She otherwise nontoxic and wishes stable appropriate for outpatient follow-up. [PR]    Clinical Course User Index [PR] Merlyn Lot, MD    The patient was evaluated in Emergency Department today for the symptoms described in the history of present illness. He/she was evaluated in the context of the global COVID-19 pandemic, which necessitated consideration that the patient might be at risk for infection with the SARS-CoV-2 virus that causes COVID-19. Institutional protocols and algorithms that pertain to the evaluation of patients at risk for COVID-19 are in a state of rapid change based on information released by regulatory bodies including the CDC and federal and state organizations. These policies and algorithms were followed during the patient's care in the ED.  As part of my medical decision making, I reviewed the following data within the [place] notes reviewed and incorporated, Labs reviewed, notes from prior ED visits and  Controlled Substance Database   ____________________________________________   FINAL CLINICAL IMPRESSION(S) / ED DIAGNOSES  Final diagnoses:  Atypical chest pain      NEW MEDICATIONS STARTED DURING THIS VISIT:  New Prescriptions   No medications on file     Note:  This document was prepared using Dragon voice recognition software and may include unintentional dictation errors.    Merlyn Lot, MD 12/21/20 1946

## 2020-12-21 NOTE — ED Triage Notes (Addendum)
Pt here with CP and oral pain from [facility]. Pt states pain is stabbing and severe. Pain is centered and radiates to her neck. Pt denies N/V/D. Pt's gumline is also having a lot of pain.

## 2020-12-23 DIAGNOSIS — R0789 Other chest pain: Secondary | ICD-10-CM | POA: Diagnosis not present

## 2020-12-23 DIAGNOSIS — K219 Gastro-esophageal reflux disease without esophagitis: Secondary | ICD-10-CM | POA: Diagnosis not present

## 2020-12-23 DIAGNOSIS — Z952 Presence of prosthetic heart valve: Secondary | ICD-10-CM | POA: Diagnosis not present

## 2020-12-29 ENCOUNTER — Other Ambulatory Visit: Payer: Self-pay

## 2020-12-29 ENCOUNTER — Inpatient Hospital Stay: Payer: PPO | Attending: Oncology

## 2020-12-29 DIAGNOSIS — D508 Other iron deficiency anemias: Secondary | ICD-10-CM

## 2020-12-29 DIAGNOSIS — D509 Iron deficiency anemia, unspecified: Secondary | ICD-10-CM | POA: Insufficient documentation

## 2020-12-29 LAB — IRON AND TIBC
Iron: 48 ug/dL (ref 28–170)
Saturation Ratios: 13 % (ref 10.4–31.8)
TIBC: 371 ug/dL (ref 250–450)
UIBC: 323 ug/dL

## 2020-12-29 LAB — CBC WITH DIFFERENTIAL/PLATELET
Abs Immature Granulocytes: 0.04 10*3/uL (ref 0.00–0.07)
Basophils Absolute: 0.1 10*3/uL (ref 0.0–0.1)
Basophils Relative: 1 %
Eosinophils Absolute: 0.5 10*3/uL (ref 0.0–0.5)
Eosinophils Relative: 4 %
HCT: 40.3 % (ref 36.0–46.0)
Hemoglobin: 13.3 g/dL (ref 12.0–15.0)
Immature Granulocytes: 0 %
Lymphocytes Relative: 28 %
Lymphs Abs: 3.4 10*3/uL (ref 0.7–4.0)
MCH: 28.9 pg (ref 26.0–34.0)
MCHC: 33 g/dL (ref 30.0–36.0)
MCV: 87.4 fL (ref 80.0–100.0)
Monocytes Absolute: 0.6 10*3/uL (ref 0.1–1.0)
Monocytes Relative: 5 %
Neutro Abs: 7.8 10*3/uL — ABNORMAL HIGH (ref 1.7–7.7)
Neutrophils Relative %: 62 %
Platelets: 299 10*3/uL (ref 150–400)
RBC: 4.61 MIL/uL (ref 3.87–5.11)
RDW: 13.3 % (ref 11.5–15.5)
WBC: 12.3 10*3/uL — ABNORMAL HIGH (ref 4.0–10.5)
nRBC: 0 % (ref 0.0–0.2)

## 2020-12-29 LAB — FERRITIN: Ferritin: 84 ng/mL (ref 11–307)

## 2021-01-02 DIAGNOSIS — H43812 Vitreous degeneration, left eye: Secondary | ICD-10-CM | POA: Diagnosis not present

## 2021-01-05 DIAGNOSIS — Z7901 Long term (current) use of anticoagulants: Secondary | ICD-10-CM | POA: Diagnosis not present

## 2021-01-18 DIAGNOSIS — I10 Essential (primary) hypertension: Secondary | ICD-10-CM | POA: Diagnosis not present

## 2021-01-18 DIAGNOSIS — Z952 Presence of prosthetic heart valve: Secondary | ICD-10-CM | POA: Diagnosis not present

## 2021-01-18 DIAGNOSIS — I350 Nonrheumatic aortic (valve) stenosis: Secondary | ICD-10-CM | POA: Diagnosis not present

## 2021-01-18 DIAGNOSIS — E7801 Familial hypercholesterolemia: Secondary | ICD-10-CM | POA: Diagnosis not present

## 2021-01-18 DIAGNOSIS — I700 Atherosclerosis of aorta: Secondary | ICD-10-CM | POA: Diagnosis not present

## 2021-01-18 DIAGNOSIS — Z950 Presence of cardiac pacemaker: Secondary | ICD-10-CM | POA: Diagnosis not present

## 2021-01-27 DIAGNOSIS — S0512XA Contusion of eyeball and orbital tissues, left eye, initial encounter: Secondary | ICD-10-CM | POA: Diagnosis not present

## 2021-01-31 DIAGNOSIS — Z7901 Long term (current) use of anticoagulants: Secondary | ICD-10-CM | POA: Diagnosis not present

## 2021-01-31 DIAGNOSIS — E119 Type 2 diabetes mellitus without complications: Secondary | ICD-10-CM | POA: Diagnosis not present

## 2021-02-02 ENCOUNTER — Encounter: Payer: Self-pay | Admitting: Ophthalmology

## 2021-02-07 DIAGNOSIS — Z7901 Long term (current) use of anticoagulants: Secondary | ICD-10-CM | POA: Diagnosis not present

## 2021-02-13 NOTE — Discharge Instructions (Signed)

## 2021-02-14 ENCOUNTER — Other Ambulatory Visit: Payer: Self-pay

## 2021-02-14 ENCOUNTER — Encounter
Admission: RE | Disposition: A | Discharge status: Home / Self Care | Payer: Self-pay | Source: Home / Self Care | Attending: Ophthalmology

## 2021-02-14 ENCOUNTER — Ambulatory Visit
Admission: RE | Admit: 2021-02-14 | Discharge: 2021-02-14 | Disposition: A | Discharge status: Home / Self Care | Payer: PPO | Attending: Ophthalmology | Admitting: Ophthalmology

## 2021-02-14 ENCOUNTER — Ambulatory Visit: Payer: PPO | Admitting: Anesthesiology

## 2021-02-14 ENCOUNTER — Encounter: Payer: Self-pay | Admitting: Ophthalmology

## 2021-02-14 DIAGNOSIS — Z7984 Long term (current) use of oral hypoglycemic drugs: Secondary | ICD-10-CM | POA: Insufficient documentation

## 2021-02-14 DIAGNOSIS — Z952 Presence of prosthetic heart valve: Secondary | ICD-10-CM | POA: Diagnosis not present

## 2021-02-14 DIAGNOSIS — H25812 Combined forms of age-related cataract, left eye: Secondary | ICD-10-CM | POA: Diagnosis not present

## 2021-02-14 DIAGNOSIS — Z950 Presence of cardiac pacemaker: Secondary | ICD-10-CM | POA: Diagnosis not present

## 2021-02-14 DIAGNOSIS — Z833 Family history of diabetes mellitus: Secondary | ICD-10-CM | POA: Diagnosis not present

## 2021-02-14 DIAGNOSIS — H2512 Age-related nuclear cataract, left eye: Secondary | ICD-10-CM | POA: Diagnosis not present

## 2021-02-14 DIAGNOSIS — Z7901 Long term (current) use of anticoagulants: Secondary | ICD-10-CM | POA: Insufficient documentation

## 2021-02-14 DIAGNOSIS — I10 Essential (primary) hypertension: Secondary | ICD-10-CM | POA: Diagnosis not present

## 2021-02-14 DIAGNOSIS — Z79899 Other long term (current) drug therapy: Secondary | ICD-10-CM | POA: Insufficient documentation

## 2021-02-14 DIAGNOSIS — Z8249 Family history of ischemic heart disease and other diseases of the circulatory system: Secondary | ICD-10-CM | POA: Diagnosis not present

## 2021-02-14 DIAGNOSIS — Z8051 Family history of malignant neoplasm of kidney: Secondary | ICD-10-CM | POA: Diagnosis not present

## 2021-02-14 DIAGNOSIS — J45909 Unspecified asthma, uncomplicated: Secondary | ICD-10-CM | POA: Diagnosis not present

## 2021-02-14 DIAGNOSIS — E1136 Type 2 diabetes mellitus with diabetic cataract: Secondary | ICD-10-CM | POA: Insufficient documentation

## 2021-02-14 DIAGNOSIS — Z87891 Personal history of nicotine dependence: Secondary | ICD-10-CM | POA: Insufficient documentation

## 2021-02-14 HISTORY — DX: Transient alteration of awareness: R40.4

## 2021-02-14 HISTORY — PX: CATARACT EXTRACTION W/PHACO: SHX586

## 2021-02-14 HISTORY — DX: Family history of other specified conditions: Z84.89

## 2021-02-14 HISTORY — DX: Presence of prosthetic heart valve: Z95.2

## 2021-02-14 HISTORY — DX: Other specified postprocedural states: Z98.890

## 2021-02-14 HISTORY — DX: Presence of cardiac pacemaker: Z95.0

## 2021-02-14 HISTORY — DX: Other specified postprocedural states: R11.2

## 2021-02-14 LAB — GLUCOSE, CAPILLARY: Glucose-Capillary: 140 mg/dL — ABNORMAL HIGH (ref 70–99)

## 2021-02-14 SURGERY — PHACOEMULSIFICATION, CATARACT, WITH IOL INSERTION
Anesthesia: Monitor Anesthesia Care | Site: Eye | Laterality: Left

## 2021-02-14 MED ORDER — MIDAZOLAM HCL 2 MG/2ML IJ SOLN
INTRAMUSCULAR | Status: DC | PRN
  Administered 2021-02-14: 1 mg via INTRAVENOUS

## 2021-02-14 MED ORDER — SIGHTPATH DOSE#1 BSS IO SOLN
INTRAOCULAR | Status: DC | PRN
  Administered 2021-02-14: 1 mL via INTRAMUSCULAR

## 2021-02-14 MED ORDER — TETRACAINE HCL 0.5 % OP SOLN
1.0000 [drp] | OPHTHALMIC | Status: DC | PRN
  Administered 2021-02-14 (×3): 1 [drp] via OPHTHALMIC

## 2021-02-14 MED ORDER — MOXIFLOXACIN HCL 0.5 % OP SOLN
OPHTHALMIC | Status: DC | PRN
  Administered 2021-02-14: 0.2 mL via OPHTHALMIC

## 2021-02-14 MED ORDER — SIGHTPATH DOSE#1 BSS IO SOLN
INTRAOCULAR | Status: DC | PRN
  Administered 2021-02-14: 55 mL via OPHTHALMIC

## 2021-02-14 MED ORDER — BRIMONIDINE TARTRATE-TIMOLOL 0.2-0.5 % OP SOLN
OPHTHALMIC | Status: DC | PRN
  Administered 2021-02-14: 1 [drp] via OPHTHALMIC

## 2021-02-14 MED ORDER — FENTANYL CITRATE (PF) 100 MCG/2ML IJ SOLN
INTRAMUSCULAR | Status: DC | PRN
  Administered 2021-02-14: 50 ug via INTRAVENOUS

## 2021-02-14 MED ORDER — SIGHTPATH DOSE#1 BSS IO SOLN
INTRAOCULAR | Status: DC | PRN
  Administered 2021-02-14: 15 mL

## 2021-02-14 MED ORDER — SIGHTPATH DOSE#1 NA CHONDROIT SULF-NA HYALURON 40-17 MG/ML IO SOLN
INTRAOCULAR | Status: DC | PRN
  Administered 2021-02-14: 1 mL via INTRAOCULAR

## 2021-02-14 MED ORDER — ARMC OPHTHALMIC DILATING DROPS
1.0000 "application " | OPHTHALMIC | Status: DC | PRN
  Administered 2021-02-14 (×3): 1 via OPHTHALMIC

## 2021-02-14 MED ORDER — LACTATED RINGERS IV SOLN: INTRAVENOUS | Status: DC

## 2021-02-14 SURGICAL SUPPLY — 13 items
CANNULA ANT/CHMB 27GA (MISCELLANEOUS) ×2 IMPLANT
GLOVE SURG ENC TEXT LTX SZ8 (GLOVE) ×2 IMPLANT
GLOVE SURG TRIUMPH 8.0 PF LTX (GLOVE) ×2 IMPLANT
GOWN STRL REUS W/ TWL LRG LVL3 (GOWN DISPOSABLE) ×2 IMPLANT
GOWN STRL REUS W/TWL LRG LVL3 (GOWN DISPOSABLE) ×4
LENS IOL TECNIS EYHANCE 23.0 (Intraocular Lens) ×2 IMPLANT
MARKER SKIN DUAL TIP RULER LAB (MISCELLANEOUS) ×2 IMPLANT
NEEDLE FILTER BLUNT 18X 1/2SAF (NEEDLE) ×1
NEEDLE FILTER BLUNT 18X1 1/2 (NEEDLE) ×1 IMPLANT
SYR 3ML LL SCALE MARK (SYRINGE) ×2 IMPLANT
SYR TB 1ML LUER SLIP (SYRINGE) ×2 IMPLANT
WATER STERILE IRR 250ML POUR (IV SOLUTION) ×2 IMPLANT
WIPE NON LINTING 3.25X3.25 (MISCELLANEOUS) ×2 IMPLANT

## 2021-02-14 NOTE — H&P (Signed)
[place]   Primary Care Physician:  [name], MD Ophthalmologist: Dr. George Ina  Pre-Procedure History & Physical: HPI:  Brianna Harris is a 60 y.o. female here for cataract surgery.   Past Medical History:  Diagnosis Date   Asthma    Deafness    Depression    Diabetes mellitus without complication (HCC)    Family history of adverse reaction to anesthesia    Mother - PONV   GERD (gastroesophageal reflux disease)    Heart abnormality    per mother pt has bad heart murmor- sees Dr Ubaldo Glassing    Hyperlipidemia    Hypertension    Mechanical heart valve present    PONV (postoperative nausea and vomiting)    Presence of permanent cardiac pacemaker    Staring episodes     Past Surgical History:  Procedure Laterality Date   ABDOMINAL HYSTERECTOMY  2000   per mother [facility] thought pt cancer and did hyster ,cervix and ovaries   CARDIAC VALVE REPLACEMENT     CHOLECYSTECTOMY     COLONOSCOPY WITH PROPOFOL N/A 08/17/2016   Procedure: COLONOSCOPY WITH PROPOFOL;  Surgeon: Manya Silvas, MD;  Location: [place];  Service: Endoscopy;  Laterality: N/A;   COLONOSCOPY WITH PROPOFOL N/A 02/02/2020   Procedure: COLONOSCOPY WITH PROPOFOL;  Surgeon: [name], MD;  Location: [facility] ENDOSCOPY;  Service: Gastroenterology;  Laterality: N/A;   ESOPHAGOGASTRODUODENOSCOPY (EGD) WITH PROPOFOL N/A 08/17/2016   Procedure: ESOPHAGOGASTRODUODENOSCOPY (EGD) WITH PROPOFOL;  Surgeon: Manya Silvas, MD;  Location: [facility] ENDOSCOPY;  Service: Endoscopy;  Laterality: N/A;   ESOPHAGOGASTRODUODENOSCOPY (EGD) WITH PROPOFOL  02/02/2020   Procedure: ESOPHAGOGASTRODUODENOSCOPY (EGD) WITH PROPOFOL;  Surgeon: [name], MD;  Location: [place];  Service: Gastroenterology;;   PACEMAKER IMPLANT  01/2018    Prior to Admission medications   Medication Sig Start Date End Date Taking? Authorizing Provider  acetaminophen (TYLENOL) 500 MG tablet Take 1,000 mg by mouth every 6 (six)  hours as needed for moderate pain.   Yes [provider]  amitriptyline (ELAVIL) 25 MG tablet Take 10 mg by mouth at bedtime. 05/04/19  Yes [provider]  atorvastatin (LIPITOR) 40 MG tablet Take 40 mg by mouth at bedtime. 10/19/19  Yes [provider]  lamoTRIgine (LAMICTAL) 100 MG tablet Take 100 mg by mouth 2 (two) times daily. 06/10/20 06/10/21 Yes [provider]  metFORMIN (GLUCOPHAGE-XR) 500 MG 24 hr tablet Take 1,000 mg by mouth 2 (two) times daily. 09/16/20  Yes [provider]  metoprolol tartrate (LOPRESSOR) 25 MG tablet Take 25 mg by mouth 2 (two) times daily.  05/04/19  Yes [provider]  Multiple Vitamins-Minerals (MULTIVITAMIN WITH MINERALS) tablet Take 1 tablet by mouth daily. Adults   Yes [provider]  omeprazole (PRILOSEC) 40 MG capsule Take 40 mg by mouth daily.   Yes [provider]  warfarin (COUMADIN) 1 MG tablet Take 3 mg by mouth See admin instructions. Take with 2 mg table for a total of 3 mg on Mon. Wed and Fri. 05/25/19  Yes [provider]  warfarin (COUMADIN) 2 MG tablet Take 2 mg by mouth See admin instructions. Take with 0.5 table of 1 mg for a total of 2.5 mg Cain Saupe, Sat., and Sunday 11/30/20  Yes [provider]  Blood Glucose Monitoring Suppl (ONE TOUCH ULTRA 2) w/Device KIT See admin instructions. 10/02/19   [provider]  Lancets (ONETOUCH DELICA PLUS [id number]) [place]  10/05/19   [provider]  ONE TOUCH ULTRA TEST test strip  05/08/16   [provider]  OneTouch Delica Lancets 83K MISC Use once daily to check blood sugars dx e11.9 10/05/19   [provider]    Allergies as of 01/03/2021   (No Known Allergies)    Family History  Problem Relation Age of Onset   Diabetes Maternal Uncle    Heart attack Father    Kidney cancer Paternal Uncle    Breast cancer Neg Hx    Bladder Cancer Neg Hx     Social History   Socioeconomic  History   Marital status: Single    Spouse name: Not on file   Number of children: Not on file   Years of education: Not on file   Highest education level: Not on file  Occupational History   Not on file  Tobacco Use   Smoking status: Former    Packs/day: 1.50    Years: 39.00    Pack years: 58.50    Types: Cigarettes    Quit date: 02/13/2018    Years since quitting: 3.0   Smokeless tobacco: Never  Vaping Use   Vaping Use: Former  Substance and Sexual Activity   Alcohol use: Not Currently   Drug use: Yes    Types: Marijuana, "Crack" cocaine    Comment: smoked 3 yr ago,for 2 y. Hx of cocaine use    Sexual activity: Never  Other Topics Concern   Not on file  Social History Narrative   Not on file   Social Determinants of Health   Financial Resource Strain: Not on file  Food Insecurity: Not on file  Transportation Needs: Not on file  Physical Activity: Not on file  Stress: Not on file  Social Connections: Not on file  Intimate Partner Violence: Not on file    Review of Systems: See HPI, otherwise negative ROS  Physical Exam: BP 118/76   Pulse 69   Temp (!) 97.2 F (36.2 C) (Temporal)   Resp 16   Ht '5\' 2"'  (1.575 m)   Wt 58.5 kg   SpO2 99%   BMI 23.59 kg/m  General:   Alert, cooperative in NAD Head:  Normocephalic and atraumatic. Respiratory:  Normal work of breathing. Cardiovascular:  RRR  Impression/Plan: Brianna Harris is here for cataract surgery.  Risks, benefits, limitations, and alternatives regarding cataract surgery have been reviewed with the patient.  Questions have been answered.  All parties agreeable.   Birder Robson, MD  02/14/2021, 10:54 AM

## 2021-02-14 NOTE — Anesthesia Postprocedure Evaluation (Signed)
Anesthesia Post Note  Patient: Brianna Harris  Procedure(s) Performed: CATARACT EXTRACTION PHACO AND INTRAOCULAR LENS PLACEMENT (IOC) Left DIABETIC (Left: Eye)     Patient location during evaluation: PACU Anesthesia Type: MAC Level of consciousness: awake and alert Pain management: pain level controlled Vital Signs Assessment: post-procedure vital signs reviewed and stable Respiratory status: spontaneous breathing, nonlabored ventilation, respiratory function stable and patient connected to nasal cannula oxygen Cardiovascular status: stable and blood pressure returned to baseline Postop Assessment: no apparent nausea or vomiting Anesthetic complications: no   No notable events documented.  Fidel Levy

## 2021-02-14 NOTE — Transfer of Care (Signed)
Immediate Anesthesia Transfer of Care Note  Patient: Brianna Harris  Procedure(s) Performed: CATARACT EXTRACTION PHACO AND INTRAOCULAR LENS PLACEMENT (IOC) Left DIABETIC (Left)  Patient Location: PACU  Anesthesia Type: MAC  Level of Consciousness: awake, alert  and patient cooperative  Airway and Oxygen Therapy: Patient Spontanous Breathing and Patient connected to supplemental oxygen  Post-op Assessment: Post-op Vital signs reviewed, Patient's Cardiovascular Status Stable, Respiratory Function Stable, Patent Airway and No signs of Nausea or vomiting  Post-op Vital Signs: Reviewed and stable  Complications: No notable events documented.

## 2021-02-14 NOTE — Anesthesia Preprocedure Evaluation (Signed)
Anesthesia Evaluation  Patient identified by MRN, date of birth, ID band Patient awake    Reviewed: NPO status   History of Anesthesia Complications Negative for: history of anesthetic complications  Airway Mallampati: II  TM Distance: >3 FB Neck ROM: full    Dental no notable dental hx.    Pulmonary asthma (mild) , former smoker,    Pulmonary exam normal        Cardiovascular Exercise Tolerance: Good hypertension, Normal cardiovascular exam+ pacemaker    bicuspid AV s/p AVR on 2019;  echo: 03/2020:  NORMAL LEFT VENTRICULAR SYSTOLIC FUNCTION  NORMAL RIGHT VENTRICULAR SYSTOLIC FUNCTION  MILD VALVULAR REGURGITATION (See above)  NO VALVULAR STENOSIS  Closest EF: >55%  AVS: PROSTHETIC AoV  Aortic: TRIVIAL AR  Mitral: MILD MR  Tricuspid: MILD TR;  ekg: 12/2020: AV paced;    Neuro/Psych Seizures - (Staring episodes),  PSYCHIATRIC DISORDERS Anxiety Depression deaf > sign language interpreter;    GI/Hepatic GERD  Controlled,(+)     substance abuse (hx alcohol & cocaine abuse : 3 years ago)  ,   Endo/Other  diabetes  Renal/GU negative Renal ROS  negative genitourinary   Musculoskeletal s/p fall 3 weeks ago : L eye, forehead, cheek bruising;     Abdominal   Peds  Hematology negative hematology ROS (+)   Anesthesia Other Findings cards cleared: 01/2021:  cards: 12/2020: Ubaldo Glassing;  last coumadin: 10/24;  deaf > sign language interpreter;  pacermaker > magnet at bedside.;  Reproductive/Obstetrics                             Anesthesia Physical Anesthesia Plan  ASA: 3  Anesthesia Plan: MAC   Post-op Pain Management:    Induction:   PONV Risk Score and Plan: 2 and Midazolam and TIVA  Airway Management Planned:   Additional Equipment:   Intra-op Plan:   Post-operative Plan:   Informed Consent: I have reviewed the patients History and Physical, chart, labs and discussed the  procedure including the risks, benefits and alternatives for the proposed anesthesia with the patient or authorized representative who has indicated his/her understanding and acceptance.       Plan Discussed with: CRNA  Anesthesia Plan Comments:         Anesthesia Quick Evaluation

## 2021-02-14 NOTE — Anesthesia Procedure Notes (Signed)
Date/Time: 02/14/2021 11:06 AM Performed by: Mayme Genta, CRNA Pre-anesthesia Checklist: Patient identified, Emergency Drugs available, Suction available, Timeout performed and Patient being monitored Patient Re-evaluated:Patient Re-evaluated prior to induction Oxygen Delivery Method: Nasal cannula Placement Confirmation: positive ETCO2

## 2021-02-14 NOTE — Op Note (Signed)
PREOPERATIVE DIAGNOSIS:  Nuclear sclerotic cataract of the left eye.   POSTOPERATIVE DIAGNOSIS:  Nuclear sclerotic cataract of the left eye.   OPERATIVE PROCEDURE:ORPROCALL@   SURGEON:  Birder Robson, MD.   ANESTHESIA:  Anesthesiologist: Fidel Levy, MD CRNA: Mayme Genta, CRNA  1.      Managed anesthesia care. 2.     0.46ml of Shugarcaine was instilled following the paracentesis   COMPLICATIONS:  None.   TECHNIQUE:   Stop and chop   DESCRIPTION OF PROCEDURE:  The patient was examined and consented in the preoperative holding area where the aforementioned topical anesthesia was applied to the left eye and then brought back to the Operating Room where the left eye was prepped and draped in the usual sterile ophthalmic fashion and a lid speculum was placed. A paracentesis was created with the side port blade and the anterior chamber was filled with viscoelastic. A near clear corneal incision was performed with the steel keratome. A continuous curvilinear capsulorrhexis was performed with a cystotome followed by the capsulorrhexis forceps. Hydrodissection and hydrodelineation were carried out with BSS on a blunt cannula. The lens was removed in a stop and chop  technique and the remaining cortical material was removed with the irrigation-aspiration handpiece. The capsular bag was inflated with viscoelastic and the Technis [id number] lens was placed in the capsular bag without complication. The remaining viscoelastic was removed from the eye with the irrigation-aspiration handpiece. The wounds were hydrated. The anterior chamber was flushed with BSS and the eye was inflated to physiologic pressure. 0.32ml Vigamox was placed in the anterior chamber. The wounds were found to be water tight. The eye was dressed with Combigan. The patient was given protective glasses to wear throughout the day and a shield with which to sleep tonight. The patient was also given drops with which to begin a drop regimen  today and will follow-up with me in one day. Implant Name Type Inv. Item Serial No. Manufacturer Lot No. LRB No. Used Action  LENS IOL TECNIS EYHANCE 23.0 - [id number] Intraocular Lens LENS IOL TECNIS EYHANCE 23.0 [id number] JOHNSON   Left 1 Implanted    Procedure(s) with comments: CATARACT EXTRACTION PHACO AND INTRAOCULAR LENS PLACEMENT (IOC) Left DIABETIC (Left) - leave arrival at 9 sign language interpreter has been requested  kp  Electronically signed: Birder Robson 02/14/2021 11:34 AM

## 2021-02-15 ENCOUNTER — Encounter: Payer: Self-pay | Admitting: Ophthalmology

## 2021-03-13 DIAGNOSIS — Z952 Presence of prosthetic heart valve: Secondary | ICD-10-CM | POA: Diagnosis not present

## 2021-03-20 DIAGNOSIS — Z952 Presence of prosthetic heart valve: Secondary | ICD-10-CM | POA: Diagnosis not present

## 2021-03-31 DIAGNOSIS — R42 Dizziness and giddiness: Secondary | ICD-10-CM | POA: Diagnosis not present

## 2021-04-04 ENCOUNTER — Other Ambulatory Visit: Payer: Self-pay | Admitting: Internal Medicine

## 2021-04-04 DIAGNOSIS — R42 Dizziness and giddiness: Secondary | ICD-10-CM

## 2021-04-07 DIAGNOSIS — Z952 Presence of prosthetic heart valve: Secondary | ICD-10-CM | POA: Diagnosis not present

## 2021-04-07 DIAGNOSIS — E119 Type 2 diabetes mellitus without complications: Secondary | ICD-10-CM | POA: Diagnosis not present

## 2021-04-12 ENCOUNTER — Other Ambulatory Visit: Payer: Self-pay

## 2021-04-12 ENCOUNTER — Ambulatory Visit
Admission: RE | Admit: 2021-04-12 | Discharge: 2021-04-12 | Disposition: A | Discharge status: Home / Self Care | Payer: PPO | Source: Ambulatory Visit | Attending: Internal Medicine | Admitting: Internal Medicine

## 2021-04-12 DIAGNOSIS — R42 Dizziness and giddiness: Secondary | ICD-10-CM | POA: Insufficient documentation

## 2021-04-12 DIAGNOSIS — Z952 Presence of prosthetic heart valve: Secondary | ICD-10-CM | POA: Diagnosis not present

## 2021-04-27 ENCOUNTER — Other Ambulatory Visit: Payer: Self-pay | Admitting: *Deleted

## 2021-04-27 DIAGNOSIS — D509 Iron deficiency anemia, unspecified: Secondary | ICD-10-CM

## 2021-05-02 ENCOUNTER — Encounter: Payer: Self-pay | Admitting: Oncology

## 2021-05-03 ENCOUNTER — Encounter: Payer: Self-pay | Admitting: Oncology

## 2021-05-05 ENCOUNTER — Encounter: Payer: Self-pay | Admitting: Oncology

## 2021-05-05 ENCOUNTER — Other Ambulatory Visit: Payer: Self-pay

## 2021-05-05 ENCOUNTER — Inpatient Hospital Stay: Payer: PPO | Attending: Nurse Practitioner

## 2021-05-05 ENCOUNTER — Inpatient Hospital Stay (HOSPITAL_BASED_OUTPATIENT_CLINIC_OR_DEPARTMENT_OTHER): Payer: PPO | Admitting: Nurse Practitioner

## 2021-05-05 VITALS — BP 143/68 | HR 61 | Temp 97.2°F | Resp 16 | Wt 129.5 lb

## 2021-05-05 DIAGNOSIS — Z87891 Personal history of nicotine dependence: Secondary | ICD-10-CM | POA: Insufficient documentation

## 2021-05-05 DIAGNOSIS — D72829 Elevated white blood cell count, unspecified: Secondary | ICD-10-CM | POA: Diagnosis not present

## 2021-05-05 DIAGNOSIS — D509 Iron deficiency anemia, unspecified: Secondary | ICD-10-CM | POA: Insufficient documentation

## 2021-05-05 DIAGNOSIS — D72820 Lymphocytosis (symptomatic): Secondary | ICD-10-CM | POA: Diagnosis not present

## 2021-05-05 LAB — FERRITIN: Ferritin: 35 ng/mL (ref 11–307)

## 2021-05-05 LAB — CBC WITH DIFFERENTIAL/PLATELET
Abs Immature Granulocytes: 0.02 10*3/uL (ref 0.00–0.07)
Basophils Absolute: 0.1 10*3/uL (ref 0.0–0.1)
Basophils Relative: 1 %
Eosinophils Absolute: 0.5 10*3/uL (ref 0.0–0.5)
Eosinophils Relative: 5 %
HCT: 38.7 % (ref 36.0–46.0)
Hemoglobin: 13 g/dL (ref 12.0–15.0)
Immature Granulocytes: 0 %
Lymphocytes Relative: 31 %
Lymphs Abs: 2.7 10*3/uL (ref 0.7–4.0)
MCH: 29.7 pg (ref 26.0–34.0)
MCHC: 33.6 g/dL (ref 30.0–36.0)
MCV: 88.4 fL (ref 80.0–100.0)
Monocytes Absolute: 0.4 10*3/uL (ref 0.1–1.0)
Monocytes Relative: 5 %
Neutro Abs: 5.1 10*3/uL (ref 1.7–7.7)
Neutrophils Relative %: 58 %
Platelets: 265 10*3/uL (ref 150–400)
RBC: 4.38 MIL/uL (ref 3.87–5.11)
RDW: 13.7 % (ref 11.5–15.5)
WBC: 8.7 10*3/uL (ref 4.0–10.5)
nRBC: 0 % (ref 0.0–0.2)

## 2021-05-05 LAB — IRON AND TIBC
Iron: 61 ug/dL (ref 28–170)
Saturation Ratios: 17 % (ref 10.4–31.8)
TIBC: 358 ug/dL (ref 250–450)
UIBC: 297 ug/dL

## 2021-05-05 NOTE — Progress Notes (Signed)
Hematology/Oncology Consult note [facility]  [phone] Fax:([phone]  Patient Care Team: [name], MD as PCP - General (Internal Medicine) Ubaldo Glassing Javier Docker, MD as Consulting Physician (Cardiology)   Name of the patient: Brianna Harris  [id number]  04-15-1961   Date of visit: 05/05/21  Diagnosis- -iron deficiency anemia  Chief complaint/ Reason for visit-routine follow-up of iron deficiency anemia  Heme/Onc history: Patient is a 61 year old female who have seen for leukocytosis since August 2019.  BCR ABL testing was negative for CML and peripheral flow cytometry showed no significant immunophenotypic abnormality.  Differential mainly shows neutrophilia and lymphocytosis and white count has been relatively stable between 11-15 over the last 4 years.  Patient has also been seeing me for her anemia and underwent EGD and colonoscopy with GI.  Colonoscopy showed 1 nonbleeding AVM which was treated with APC.Also had biopsies of duodenum and stomach.  Stomach biopsy showed intestinal metaplasia but otherwise negative for celiac dysplasia or malignancy. She also underwent heart valve surgery is on Coumadin.  She has received IV iron in the past  Interval history-history obtained with the help of sign language interpreter.  Patient reports no blood in her stool or urine.  Energy levels are stable. She feels well and denies complaints.   ECOG PS- 0 Pain scale- 0   Review of systems- Review of Systems  Constitutional:  Negative for chills, fever, malaise/fatigue and weight loss.  HENT:  Negative for congestion, ear discharge and nosebleeds.   Eyes:  Negative for blurred vision.  Respiratory:  Negative for cough, hemoptysis, sputum production, shortness of breath and wheezing.   Cardiovascular:  Negative for chest pain, palpitations, orthopnea and claudication.  Gastrointestinal:  Negative for abdominal pain, blood in stool, constipation,  diarrhea, heartburn, melena, nausea and vomiting.  Genitourinary:  Negative for dysuria, flank pain, frequency, hematuria and urgency.  Musculoskeletal:  Negative for back pain, joint pain and myalgias.  Skin:  Negative for rash.  Neurological:  Negative for dizziness, tingling, focal weakness, seizures, weakness and headaches.  Endo/Heme/Allergies:  Does not bruise/bleed easily.  Psychiatric/Behavioral:  Negative for depression and suicidal ideas. The patient does not have insomnia.     No Known Allergies  Past Medical History:  Diagnosis Date   Asthma    Deafness    Depression    Diabetes mellitus without complication (HCC)    Family history of adverse reaction to anesthesia    Mother - PONV   GERD (gastroesophageal reflux disease)    Heart abnormality    per mother pt has bad heart murmor- sees Dr Ubaldo Glassing    Hyperlipidemia    Hypertension    Mechanical heart valve present    PONV (postoperative nausea and vomiting)    Presence of permanent cardiac pacemaker    Staring episodes      Past Surgical History:  Procedure Laterality Date   ABDOMINAL HYSTERECTOMY  2000   per mother [facility] thought pt cancer and did hyster ,cervix and ovaries   CARDIAC VALVE REPLACEMENT     CATARACT EXTRACTION W/PHACO Left 02/14/2021   Procedure: CATARACT EXTRACTION PHACO AND INTRAOCULAR LENS PLACEMENT ([place]) Left DIABETIC;  Surgeon: Birder Robson, MD;  Location: [facility];  Service: Ophthalmology;  Laterality: Left;  leave arrival at 9 sign language interpreter has been requested  kp 6.12 00:39.2   CHOLECYSTECTOMY     COLONOSCOPY WITH PROPOFOL N/A 08/17/2016   Procedure: COLONOSCOPY WITH PROPOFOL;  Surgeon: Manya Silvas, MD;  Location: [facility] ENDOSCOPY;  Service: Endoscopy;  Laterality: N/A;   COLONOSCOPY WITH PROPOFOL N/A 02/02/2020   Procedure: COLONOSCOPY WITH PROPOFOL;  Surgeon: [name], MD;  Location: [facility] ENDOSCOPY;  Service: Gastroenterology;  Laterality: N/A;    ESOPHAGOGASTRODUODENOSCOPY (EGD) WITH PROPOFOL N/A 08/17/2016   Procedure: ESOPHAGOGASTRODUODENOSCOPY (EGD) WITH PROPOFOL;  Surgeon: Manya Silvas, MD;  Location: [facility] ENDOSCOPY;  Service: Endoscopy;  Laterality: N/A;   ESOPHAGOGASTRODUODENOSCOPY (EGD) WITH PROPOFOL  02/02/2020   Procedure: ESOPHAGOGASTRODUODENOSCOPY (EGD) WITH PROPOFOL;  Surgeon: [name], MD;  Location: [facility] ENDOSCOPY;  Service: Gastroenterology;;   PACEMAKER IMPLANT  01/2018    Social History   Socioeconomic History   Marital status: Single    Spouse name: Not on file   Number of children: Not on file   Years of education: Not on file   Highest education level: Not on file  Occupational History   Not on file  Tobacco Use   Smoking status: Former    Packs/day: 1.50    Years: 39.00    Pack years: 58.50    Types: Cigarettes    Quit date: 02/13/2018    Years since quitting: 3.2   Smokeless tobacco: Never  Vaping Use   Vaping Use: Former  Substance and Sexual Activity   Alcohol use: Not Currently   Drug use: Yes    Types: Marijuana, "Crack" cocaine    Comment: smoked 3 yr ago,for 2 y. Hx of cocaine use    Sexual activity: Never  Other Topics Concern   Not on file  Social History Narrative   Not on file   Social Determinants of Health   Financial Resource Strain: Not on file  Food Insecurity: Not on file  Transportation Needs: Not on file  Physical Activity: Not on file  Stress: Not on file  Social Connections: Not on file  Intimate Partner Violence: Not on file    Family History  Problem Relation Age of Onset   Diabetes Maternal Uncle    Heart attack Father    Kidney cancer Paternal Uncle    Breast cancer Neg Hx    Bladder Cancer Neg Hx      Current Outpatient Medications:    acetaminophen (TYLENOL) 500 MG tablet, Take 1,000 mg by mouth every 6 (six) hours as needed for moderate pain., Disp: , Rfl:    amitriptyline (ELAVIL) 25 MG tablet, Take 10 mg by mouth at bedtime., Disp:  , Rfl:    atorvastatin (LIPITOR) 40 MG tablet, Take 40 mg by mouth at bedtime., Disp: , Rfl:    Blood Glucose Monitoring Suppl (ONE TOUCH ULTRA 2) w/Device KIT, See admin instructions., Disp: , Rfl:    Lancets (ONETOUCH DELICA PLUS [id number]) MISC, , Disp: , Rfl:    metFORMIN (GLUCOPHAGE-XR) 500 MG 24 hr tablet, Take 1,000 mg by mouth 2 (two) times daily., Disp: , Rfl:    metoprolol tartrate (LOPRESSOR) 25 MG tablet, Take 25 mg by mouth 2 (two) times daily. , Disp: , Rfl:    Multiple Vitamins-Minerals (MULTIVITAMIN WITH MINERALS) tablet, Take 1 tablet by mouth daily. Adults, Disp: , Rfl:    omeprazole (PRILOSEC) 40 MG capsule, Take 40 mg by mouth daily., Disp: , Rfl:    ONE TOUCH ULTRA TEST test strip, , Disp: , Rfl:    OneTouch Delica Lancets 40J MISC, Use once daily to check blood sugars dx e11.9, Disp: , Rfl:    warfarin (COUMADIN) 1 MG tablet, Take 3 mg by mouth See admin instructions. Take with  2 mg table for a total of 3 mg on Mon. Wed and Fri., Disp: , Rfl:    warfarin (COUMADIN) 2 MG tablet, Take 2 mg by mouth See admin instructions. Take with 0.5 table of 1 mg for a total of 2.5 mg Tues, Thurs, Sat., and Sunday, Disp: , Rfl:    lamoTRIgine (LAMICTAL) 100 MG tablet, Take 100 mg by mouth 2 (two) times daily. (Patient not taking: Reported on 05/05/2021), Disp: , Rfl:   Physical exam:  Vitals:   05/05/21 1054  BP: (!) 143/68  Pulse: 61  Resp: 16  Temp: (!) 97.2 F (36.2 C)  SpO2: 98%  Weight: 129 lb 8 oz (58.7 kg)   Physical Exam Constitutional:      General: She is not in acute distress. Cardiovascular:     Rate and Rhythm: Normal rate and regular rhythm.     Heart sounds: Normal heart sounds.  Pulmonary:     Effort: Pulmonary effort is normal.     Breath sounds: Normal breath sounds.  Abdominal:     General: Bowel sounds are normal.     Palpations: Abdomen is soft.  Skin:    General: Skin is warm and dry.  Neurological:     Mental Status: She is alert and oriented to  person, place, and time.     CMP Latest Ref Rng & Units 12/21/2020  Glucose 70 - 99 mg/dL 217(H)  BUN 6 - 20 mg/dL 13  Creatinine 0.44 - 1.00 mg/dL 0.89  Sodium 135 - 145 mmol/L 137  Potassium 3.5 - 5.1 mmol/L 3.6  Chloride 98 - 111 mmol/L 102  CO2 22 - 32 mmol/L 27  Calcium 8.9 - 10.3 mg/dL 9.5  Total Protein 6.5 - 8.1 g/dL -  Total Bilirubin 0.3 - 1.2 mg/dL -  Alkaline Phos 38 - 126 U/L -  AST 15 - 41 U/L -  ALT 0 - 44 U/L -   CBC Latest Ref Rng & Units 05/05/2021  WBC 4.0 - 10.5 K/uL 8.7  Hemoglobin 12.0 - 15.0 g/dL 13.0  Hematocrit 36.0 - 46.0 % 38.7  Platelets 150 - 400 K/uL 265   Iron/TIBC/Ferritin/ %Sat    Component Value Date/Time   IRON 61 05/05/2021 1004   IRON 54 04/07/2020 1429   TIBC 358 05/05/2021 1004   TIBC 287 04/07/2020 1429   FERRITIN 35 05/05/2021 1004   FERRITIN 228 (H) 04/07/2020 1429   IRONPCTSAT 17 05/05/2021 1004   IRONPCTSAT 19 04/07/2020 1429     Assessment and plan- Patient is a 61 y.o. female who is here for follow-up of following issues:  1.  Leukocytosis mainly neutrophilia and lymphocytosis. Flow cytometry and BCR ABL testing in the past has been negative.  White count is stable between 11-14 over the last 5 years.  Suspect this is reactive. Normalized today. Monitor.   2. Iron deficiency anemia patient's hemoglobin was 10.1 back in August 2021 when she received IV iron presently normal at 14.1/42.6. Hemoglobin today is normal, normocytic. Iron studies pending at time of visit. Ferritin 35, TIBC normal, iron saturation 17%. No indication for IV iron at this time.   Patient requested I call her mother with results. Voicemail was left.   6 mo- lab (cbc, ferritin, iron studies), Dr. Janese Banks  Due to language barrier, a sign language interpreter was present for all interactions.    Visit Diagnosis 1. Leukocytosis, unspecified type   2. Iron deficiency anemia, unspecified iron deficiency anemia type    Brianna Harris  Candise Che, AGNP-C [place]  at [facility] [phone] (clinic) 05/05/2021

## 2021-05-08 DIAGNOSIS — E119 Type 2 diabetes mellitus without complications: Secondary | ICD-10-CM | POA: Diagnosis not present

## 2021-05-08 DIAGNOSIS — E7801 Familial hypercholesterolemia: Secondary | ICD-10-CM | POA: Diagnosis not present

## 2021-05-08 DIAGNOSIS — Z952 Presence of prosthetic heart valve: Secondary | ICD-10-CM | POA: Diagnosis not present

## 2021-05-08 DIAGNOSIS — I10 Essential (primary) hypertension: Secondary | ICD-10-CM | POA: Diagnosis not present

## 2021-05-08 DIAGNOSIS — J4520 Mild intermittent asthma, uncomplicated: Secondary | ICD-10-CM | POA: Diagnosis not present

## 2021-05-08 DIAGNOSIS — K219 Gastro-esophageal reflux disease without esophagitis: Secondary | ICD-10-CM | POA: Diagnosis not present

## 2021-05-08 DIAGNOSIS — Z0001 Encounter for general adult medical examination with abnormal findings: Secondary | ICD-10-CM | POA: Diagnosis not present

## 2021-05-08 DIAGNOSIS — K760 Fatty (change of) liver, not elsewhere classified: Secondary | ICD-10-CM | POA: Diagnosis not present

## 2021-05-08 DIAGNOSIS — F32A Depression, unspecified: Secondary | ICD-10-CM | POA: Diagnosis not present

## 2021-05-08 DIAGNOSIS — Z0000 Encounter for general adult medical examination without abnormal findings: Secondary | ICD-10-CM | POA: Diagnosis not present

## 2021-05-16 DIAGNOSIS — I700 Atherosclerosis of aorta: Secondary | ICD-10-CM | POA: Diagnosis not present

## 2021-05-16 DIAGNOSIS — Z950 Presence of cardiac pacemaker: Secondary | ICD-10-CM | POA: Diagnosis not present

## 2021-05-16 DIAGNOSIS — Z7901 Long term (current) use of anticoagulants: Secondary | ICD-10-CM | POA: Diagnosis not present

## 2021-05-16 DIAGNOSIS — Z952 Presence of prosthetic heart valve: Secondary | ICD-10-CM | POA: Diagnosis not present

## 2021-05-30 DIAGNOSIS — Z952 Presence of prosthetic heart valve: Secondary | ICD-10-CM | POA: Diagnosis not present

## 2021-05-31 DIAGNOSIS — Z952 Presence of prosthetic heart valve: Secondary | ICD-10-CM | POA: Diagnosis not present

## 2021-05-31 DIAGNOSIS — E119 Type 2 diabetes mellitus without complications: Secondary | ICD-10-CM | POA: Diagnosis not present

## 2021-05-31 DIAGNOSIS — I10 Essential (primary) hypertension: Secondary | ICD-10-CM | POA: Diagnosis not present

## 2021-05-31 DIAGNOSIS — F32A Depression, unspecified: Secondary | ICD-10-CM | POA: Diagnosis not present

## 2021-06-08 DIAGNOSIS — Z7901 Long term (current) use of anticoagulants: Secondary | ICD-10-CM | POA: Diagnosis not present

## 2021-06-14 DIAGNOSIS — I442 Atrioventricular block, complete: Secondary | ICD-10-CM | POA: Diagnosis not present

## 2021-06-14 DIAGNOSIS — Z7901 Long term (current) use of anticoagulants: Secondary | ICD-10-CM | POA: Diagnosis not present

## 2021-06-22 DIAGNOSIS — E119 Type 2 diabetes mellitus without complications: Secondary | ICD-10-CM | POA: Diagnosis not present

## 2021-06-22 DIAGNOSIS — M47816 Spondylosis without myelopathy or radiculopathy, lumbar region: Secondary | ICD-10-CM | POA: Diagnosis not present

## 2021-06-22 DIAGNOSIS — Z7901 Long term (current) use of anticoagulants: Secondary | ICD-10-CM | POA: Diagnosis not present

## 2021-06-22 DIAGNOSIS — I10 Essential (primary) hypertension: Secondary | ICD-10-CM | POA: Diagnosis not present

## 2021-06-22 DIAGNOSIS — G8929 Other chronic pain: Secondary | ICD-10-CM | POA: Diagnosis not present

## 2021-06-22 DIAGNOSIS — H9193 Unspecified hearing loss, bilateral: Secondary | ICD-10-CM | POA: Diagnosis not present

## 2021-06-22 DIAGNOSIS — M545 Low back pain, unspecified: Secondary | ICD-10-CM | POA: Diagnosis not present

## 2021-06-26 DIAGNOSIS — R569 Unspecified convulsions: Secondary | ICD-10-CM | POA: Diagnosis not present

## 2021-06-26 DIAGNOSIS — R42 Dizziness and giddiness: Secondary | ICD-10-CM | POA: Diagnosis not present

## 2021-06-26 DIAGNOSIS — H9193 Unspecified hearing loss, bilateral: Secondary | ICD-10-CM | POA: Diagnosis not present

## 2021-06-26 DIAGNOSIS — K760 Fatty (change of) liver, not elsewhere classified: Secondary | ICD-10-CM | POA: Diagnosis not present

## 2021-06-26 DIAGNOSIS — Z8673 Personal history of transient ischemic attack (TIA), and cerebral infarction without residual deficits: Secondary | ICD-10-CM | POA: Diagnosis not present

## 2021-06-26 DIAGNOSIS — I10 Essential (primary) hypertension: Secondary | ICD-10-CM | POA: Diagnosis not present

## 2021-07-06 ENCOUNTER — Other Ambulatory Visit: Payer: Self-pay | Admitting: Student

## 2021-07-06 DIAGNOSIS — I10 Essential (primary) hypertension: Secondary | ICD-10-CM | POA: Diagnosis not present

## 2021-07-06 DIAGNOSIS — Z87891 Personal history of nicotine dependence: Secondary | ICD-10-CM | POA: Diagnosis not present

## 2021-07-06 DIAGNOSIS — E1136 Type 2 diabetes mellitus with diabetic cataract: Secondary | ICD-10-CM | POA: Diagnosis not present

## 2021-07-06 DIAGNOSIS — Z950 Presence of cardiac pacemaker: Secondary | ICD-10-CM | POA: Diagnosis not present

## 2021-07-06 DIAGNOSIS — J45909 Unspecified asthma, uncomplicated: Secondary | ICD-10-CM | POA: Diagnosis not present

## 2021-07-06 DIAGNOSIS — I442 Atrioventricular block, complete: Secondary | ICD-10-CM | POA: Diagnosis not present

## 2021-07-06 DIAGNOSIS — F331 Major depressive disorder, recurrent, moderate: Secondary | ICD-10-CM | POA: Diagnosis not present

## 2021-07-07 ENCOUNTER — Other Ambulatory Visit (HOSPITAL_COMMUNITY): Payer: Self-pay | Admitting: Student

## 2021-07-07 DIAGNOSIS — R42 Dizziness and giddiness: Secondary | ICD-10-CM

## 2021-07-13 DIAGNOSIS — Z7901 Long term (current) use of anticoagulants: Secondary | ICD-10-CM | POA: Diagnosis not present

## 2021-07-17 DIAGNOSIS — Z950 Presence of cardiac pacemaker: Secondary | ICD-10-CM | POA: Diagnosis not present

## 2021-07-17 DIAGNOSIS — I10 Essential (primary) hypertension: Secondary | ICD-10-CM | POA: Diagnosis not present

## 2021-07-17 DIAGNOSIS — Z952 Presence of prosthetic heart valve: Secondary | ICD-10-CM | POA: Diagnosis not present

## 2021-07-17 DIAGNOSIS — R42 Dizziness and giddiness: Secondary | ICD-10-CM | POA: Diagnosis not present

## 2021-07-17 DIAGNOSIS — Z7901 Long term (current) use of anticoagulants: Secondary | ICD-10-CM | POA: Diagnosis not present

## 2021-07-31 DIAGNOSIS — E119 Type 2 diabetes mellitus without complications: Secondary | ICD-10-CM | POA: Diagnosis not present

## 2021-08-07 DIAGNOSIS — Z952 Presence of prosthetic heart valve: Secondary | ICD-10-CM | POA: Diagnosis not present

## 2021-08-07 DIAGNOSIS — F32A Depression, unspecified: Secondary | ICD-10-CM | POA: Diagnosis not present

## 2021-08-07 DIAGNOSIS — E7801 Familial hypercholesterolemia: Secondary | ICD-10-CM | POA: Diagnosis not present

## 2021-08-07 DIAGNOSIS — I10 Essential (primary) hypertension: Secondary | ICD-10-CM | POA: Diagnosis not present

## 2021-08-07 DIAGNOSIS — K219 Gastro-esophageal reflux disease without esophagitis: Secondary | ICD-10-CM | POA: Diagnosis not present

## 2021-08-07 DIAGNOSIS — E119 Type 2 diabetes mellitus without complications: Secondary | ICD-10-CM | POA: Diagnosis not present

## 2021-08-07 DIAGNOSIS — J4520 Mild intermittent asthma, uncomplicated: Secondary | ICD-10-CM | POA: Diagnosis not present

## 2021-08-08 DIAGNOSIS — Z952 Presence of prosthetic heart valve: Secondary | ICD-10-CM | POA: Diagnosis not present

## 2021-08-08 DIAGNOSIS — R42 Dizziness and giddiness: Secondary | ICD-10-CM | POA: Diagnosis not present

## 2021-08-08 DIAGNOSIS — G9389 Other specified disorders of brain: Secondary | ICD-10-CM | POA: Diagnosis not present

## 2021-08-08 DIAGNOSIS — Z950 Presence of cardiac pacemaker: Secondary | ICD-10-CM | POA: Diagnosis not present

## 2021-08-14 DIAGNOSIS — Z7901 Long term (current) use of anticoagulants: Secondary | ICD-10-CM | POA: Diagnosis not present

## 2021-08-16 ENCOUNTER — Other Ambulatory Visit: Payer: Self-pay | Admitting: Internal Medicine

## 2021-08-16 DIAGNOSIS — Z1231 Encounter for screening mammogram for malignant neoplasm of breast: Secondary | ICD-10-CM

## 2021-08-17 DIAGNOSIS — H2512 Age-related nuclear cataract, left eye: Secondary | ICD-10-CM | POA: Diagnosis not present

## 2021-08-24 ENCOUNTER — Ambulatory Visit (HOSPITAL_COMMUNITY): Admission: RE | Admit: 2021-08-24 | Payer: PPO | Source: Ambulatory Visit

## 2021-08-24 ENCOUNTER — Encounter (HOSPITAL_COMMUNITY): Payer: Self-pay

## 2021-08-24 ENCOUNTER — Other Ambulatory Visit (HOSPITAL_COMMUNITY): Payer: PPO

## 2021-08-24 ENCOUNTER — Ambulatory Visit (HOSPITAL_COMMUNITY): Payer: PPO

## 2021-09-13 DIAGNOSIS — Z7901 Long term (current) use of anticoagulants: Secondary | ICD-10-CM | POA: Diagnosis not present

## 2021-09-15 ENCOUNTER — Ambulatory Visit
Admission: RE | Admit: 2021-09-15 | Discharge: 2021-09-15 | Disposition: A | Discharge status: Home / Self Care | Payer: PPO | Source: Ambulatory Visit | Attending: Internal Medicine | Admitting: Internal Medicine

## 2021-09-15 DIAGNOSIS — Z1231 Encounter for screening mammogram for malignant neoplasm of breast: Secondary | ICD-10-CM | POA: Insufficient documentation

## 2021-09-20 DIAGNOSIS — Z7901 Long term (current) use of anticoagulants: Secondary | ICD-10-CM | POA: Diagnosis not present

## 2021-09-26 DIAGNOSIS — Z87898 Personal history of other specified conditions: Secondary | ICD-10-CM | POA: Diagnosis not present

## 2021-09-26 DIAGNOSIS — G9389 Other specified disorders of brain: Secondary | ICD-10-CM | POA: Diagnosis not present

## 2021-09-26 DIAGNOSIS — R42 Dizziness and giddiness: Secondary | ICD-10-CM | POA: Diagnosis not present

## 2021-09-26 DIAGNOSIS — G939 Disorder of brain, unspecified: Secondary | ICD-10-CM | POA: Diagnosis not present

## 2021-09-26 DIAGNOSIS — Z8673 Personal history of transient ischemic attack (TIA), and cerebral infarction without residual deficits: Secondary | ICD-10-CM | POA: Diagnosis not present

## 2021-09-26 DIAGNOSIS — Z7901 Long term (current) use of anticoagulants: Secondary | ICD-10-CM | POA: Diagnosis not present

## 2021-10-11 DIAGNOSIS — Z7901 Long term (current) use of anticoagulants: Secondary | ICD-10-CM | POA: Diagnosis not present

## 2021-10-31 DIAGNOSIS — Z7901 Long term (current) use of anticoagulants: Secondary | ICD-10-CM | POA: Diagnosis not present

## 2021-10-31 DIAGNOSIS — E119 Type 2 diabetes mellitus without complications: Secondary | ICD-10-CM | POA: Diagnosis not present

## 2021-11-01 ENCOUNTER — Encounter: Payer: Self-pay | Admitting: Oncology

## 2021-11-06 ENCOUNTER — Inpatient Hospital Stay: Payer: PPO | Attending: Oncology

## 2021-11-06 ENCOUNTER — Inpatient Hospital Stay (HOSPITAL_BASED_OUTPATIENT_CLINIC_OR_DEPARTMENT_OTHER): Payer: PPO | Admitting: Oncology

## 2021-11-06 ENCOUNTER — Encounter: Payer: Self-pay | Admitting: Oncology

## 2021-11-06 ENCOUNTER — Other Ambulatory Visit: Payer: Self-pay

## 2021-11-06 DIAGNOSIS — J4520 Mild intermittent asthma, uncomplicated: Secondary | ICD-10-CM | POA: Diagnosis not present

## 2021-11-06 DIAGNOSIS — K760 Fatty (change of) liver, not elsewhere classified: Secondary | ICD-10-CM | POA: Diagnosis not present

## 2021-11-06 DIAGNOSIS — I350 Nonrheumatic aortic (valve) stenosis: Secondary | ICD-10-CM | POA: Diagnosis not present

## 2021-11-06 DIAGNOSIS — D509 Iron deficiency anemia, unspecified: Secondary | ICD-10-CM | POA: Insufficient documentation

## 2021-11-06 DIAGNOSIS — E119 Type 2 diabetes mellitus without complications: Secondary | ICD-10-CM | POA: Diagnosis not present

## 2021-11-06 DIAGNOSIS — E7801 Familial hypercholesterolemia: Secondary | ICD-10-CM | POA: Diagnosis not present

## 2021-11-06 DIAGNOSIS — Z952 Presence of prosthetic heart valve: Secondary | ICD-10-CM | POA: Diagnosis not present

## 2021-11-06 DIAGNOSIS — D72829 Elevated white blood cell count, unspecified: Secondary | ICD-10-CM | POA: Diagnosis not present

## 2021-11-06 DIAGNOSIS — I10 Essential (primary) hypertension: Secondary | ICD-10-CM | POA: Diagnosis not present

## 2021-11-06 DIAGNOSIS — Z79899 Other long term (current) drug therapy: Secondary | ICD-10-CM | POA: Insufficient documentation

## 2021-11-06 LAB — CBC WITH DIFFERENTIAL/PLATELET
Abs Immature Granulocytes: 0.04 10*3/uL (ref 0.00–0.07)
Basophils Absolute: 0.1 10*3/uL (ref 0.0–0.1)
Basophils Relative: 1 %
Eosinophils Absolute: 0.4 10*3/uL (ref 0.0–0.5)
Eosinophils Relative: 4 %
HCT: 39.8 % (ref 36.0–46.0)
Hemoglobin: 13.1 g/dL (ref 12.0–15.0)
Immature Granulocytes: 0 %
Lymphocytes Relative: 33 %
Lymphs Abs: 3.8 10*3/uL (ref 0.7–4.0)
MCH: 28.7 pg (ref 26.0–34.0)
MCHC: 32.9 g/dL (ref 30.0–36.0)
MCV: 87.1 fL (ref 80.0–100.0)
Monocytes Absolute: 0.5 10*3/uL (ref 0.1–1.0)
Monocytes Relative: 5 %
Neutro Abs: 6.9 10*3/uL (ref 1.7–7.7)
Neutrophils Relative %: 57 %
Platelets: 331 10*3/uL (ref 150–400)
RBC: 4.57 MIL/uL (ref 3.87–5.11)
RDW: 13.1 % (ref 11.5–15.5)
WBC: 11.8 10*3/uL — ABNORMAL HIGH (ref 4.0–10.5)
nRBC: 0 % (ref 0.0–0.2)

## 2021-11-06 LAB — IRON AND TIBC
Iron: 60 ug/dL (ref 28–170)
Saturation Ratios: 16 % (ref 10.4–31.8)
TIBC: 381 ug/dL (ref 250–450)
UIBC: 321 ug/dL

## 2021-11-06 LAB — FERRITIN: Ferritin: 62 ng/mL (ref 11–307)

## 2021-11-06 NOTE — Progress Notes (Signed)
Pt has no concerns and pt coumadin is now 3 mg daily and she is not checking her sugars so I suggested to start back doing that for her on health

## 2021-11-06 NOTE — Progress Notes (Signed)
Hematology/Oncology Consult note [facility]  Telephone:([phone] Fax:([phone]  Patient Care Team: [name], MD as PCP - General (Internal Medicine) Ubaldo Glassing Javier Docker, MD as Consulting Physician (Cardiology)   Name of the patient: Brianna Harris  [id number]  05-03-1960   Date of visit: 11/06/21  Diagnosis-leukocytosis mainly neutrophilia Iron deficiency anemia  Chief complaint/ Reason for visit-routine follow-up of neutrophilia and iron deficiency anemia  Heme/Onc history: Patient is a 61 year old female who have seen for leukocytosis since August 2019.  BCR ABL testing was negative for CML and peripheral flow cytometry showed no significant immunophenotypic abnormality.  Differential mainly shows neutrophilia and lymphocytosis and white count has been relatively stable between 11-15 over the last 4 years.  Patient has also been seeing me for her anemia and underwent EGD and colonoscopy with GI.  Colonoscopy showed 1 nonbleeding AVM which was treated with APC.Also had biopsies of duodenum and stomach.  Stomach biopsy showed intestinal metaplasia but otherwise negative for celiac dysplasia or malignancy. She also underwent heart valve surgery is on Coumadin.  She has received IV iron in the past  Interval history-history obtained with the help of sign language interpreter.  Overall patient is doing well and denies any specific complaints at this time.  Energy levels have been stable.  Denies any blood loss in her stool or urine.  ECOG PS- 1 Pain scale- 0   Review of systems- Review of Systems  Constitutional:  Negative for chills, fever, malaise/fatigue and weight loss.  HENT:  Negative for congestion, ear discharge and nosebleeds.   Eyes:  Negative for blurred vision.  Respiratory:  Negative for cough, hemoptysis, sputum production, shortness of breath and wheezing.   Cardiovascular:  Negative for chest pain, palpitations, orthopnea and  claudication.  Gastrointestinal:  Negative for abdominal pain, blood in stool, constipation, diarrhea, heartburn, melena, nausea and vomiting.  Genitourinary:  Negative for dysuria, flank pain, frequency, hematuria and urgency.  Musculoskeletal:  Negative for back pain, joint pain and myalgias.  Skin:  Negative for rash.  Neurological:  Negative for dizziness, tingling, focal weakness, seizures, weakness and headaches.  Endo/Heme/Allergies:  Does not bruise/bleed easily.  Psychiatric/Behavioral:  Negative for depression and suicidal ideas. The patient does not have insomnia.       No Known Allergies   Past Medical History:  Diagnosis Date   Asthma    Deafness    Depression    Diabetes mellitus without complication (HCC)    Family history of adverse reaction to anesthesia    Mother - PONV   GERD (gastroesophageal reflux disease)    Heart abnormality    per mother pt has bad heart murmor- sees Dr Ubaldo Glassing    Hyperlipidemia    Hypertension    Mechanical heart valve present    PONV (postoperative nausea and vomiting)    Presence of permanent cardiac pacemaker    Staring episodes      Past Surgical History:  Procedure Laterality Date   ABDOMINAL HYSTERECTOMY  2000   per mother [facility] thought pt cancer and did hyster ,cervix and ovaries   CARDIAC VALVE REPLACEMENT     CATARACT EXTRACTION W/PHACO Left 02/14/2021   Procedure: CATARACT EXTRACTION PHACO AND INTRAOCULAR LENS PLACEMENT ([place]) Left DIABETIC;  Surgeon: Birder Robson, MD;  Location: [facility];  Service: Ophthalmology;  Laterality: Left;  leave arrival at 9 sign language interpreter has been requested  kp 6.12 00:39.2   CHOLECYSTECTOMY     COLONOSCOPY WITH PROPOFOL  N/A 08/17/2016   Procedure: COLONOSCOPY WITH PROPOFOL;  Surgeon: Manya Silvas, MD;  Location: [facility] ENDOSCOPY;  Service: Endoscopy;  Laterality: N/A;   COLONOSCOPY WITH PROPOFOL N/A 02/02/2020   Procedure: COLONOSCOPY WITH PROPOFOL;  Surgeon:  [name], MD;  Location: [facility] ENDOSCOPY;  Service: Gastroenterology;  Laterality: N/A;   ESOPHAGOGASTRODUODENOSCOPY (EGD) WITH PROPOFOL N/A 08/17/2016   Procedure: ESOPHAGOGASTRODUODENOSCOPY (EGD) WITH PROPOFOL;  Surgeon: Manya Silvas, MD;  Location: [facility] ENDOSCOPY;  Service: Endoscopy;  Laterality: N/A;   ESOPHAGOGASTRODUODENOSCOPY (EGD) WITH PROPOFOL  02/02/2020   Procedure: ESOPHAGOGASTRODUODENOSCOPY (EGD) WITH PROPOFOL;  Surgeon: [name], MD;  Location: [facility] ENDOSCOPY;  Service: Gastroenterology;;   PACEMAKER IMPLANT  01/2018    Social History   Socioeconomic History   Marital status: Single    Spouse name: Not on file   Number of children: Not on file   Years of education: Not on file   Highest education level: Not on file  Occupational History   Not on file  Tobacco Use   Smoking status: Former    Packs/day: 1.50    Years: 39.00    Total pack years: 58.50    Types: Cigarettes    Quit date: 02/13/2018    Years since quitting: 3.7   Smokeless tobacco: Never  Vaping Use   Vaping Use: Former  Substance and Sexual Activity   Alcohol use: Yes    Comment: occasional wine   Drug use: Yes    Types: Marijuana    Comment: smoked 3 yr ago,for 2 y. Hx of cocaine use    Sexual activity: Never  Other Topics Concern   Not on file  Social History Narrative   Not on file   Social Determinants of Health   Financial Resource Strain: Not on file  Food Insecurity: Not on file  Transportation Needs: Not on file  Physical Activity: Not on file  Stress: Not on file  Social Connections: Not on file  Intimate Partner Violence: Not on file    Family History  Problem Relation Age of Onset   Diabetes Maternal Uncle    Heart attack Father    Kidney cancer Paternal Uncle    Breast cancer Neg Hx    Bladder Cancer Neg Hx      Current Outpatient Medications:    acetaminophen (TYLENOL) 500 MG tablet, Take 1,000 mg by mouth every 6 (six) hours as needed for  moderate pain., Disp: , Rfl:    amitriptyline (ELAVIL) 25 MG tablet, Take 10 mg by mouth at bedtime., Disp: , Rfl:    atorvastatin (LIPITOR) 40 MG tablet, Take 40 mg by mouth at bedtime., Disp: , Rfl:    metFORMIN (GLUCOPHAGE-XR) 500 MG 24 hr tablet, Take 1,000 mg by mouth 2 (two) times daily., Disp: , Rfl:    metoprolol tartrate (LOPRESSOR) 25 MG tablet, Take 25 mg by mouth 2 (two) times daily. , Disp: , Rfl:    Multiple Vitamins-Minerals (MULTIVITAMIN WITH MINERALS) tablet, Take 1 tablet by mouth daily. Adults, Disp: , Rfl:    omeprazole (PRILOSEC) 40 MG capsule, Take 40 mg by mouth daily., Disp: , Rfl:    ONE TOUCH ULTRA TEST test strip, , Disp: , Rfl:    OneTouch Delica Lancets 85I MISC, Use once daily to check blood sugars dx e11.9, Disp: , Rfl:    warfarin (COUMADIN) 3 MG tablet, Take 3 mg by mouth daily., Disp: , Rfl:    Blood Glucose Monitoring Suppl (ONE TOUCH ULTRA 2) w/Device KIT, See  admin instructions. (Patient not taking: Reported on 11/06/2021), Disp: , Rfl:    Lancets (ONETOUCH DELICA PLUS [id number]) MISC, , Disp: , Rfl:   Physical exam:  Physical Exam Constitutional:      General: She is not in acute distress. Cardiovascular:     Rate and Rhythm: Normal rate and regular rhythm.     Heart sounds: Normal heart sounds.  Pulmonary:     Effort: Pulmonary effort is normal.     Breath sounds: Normal breath sounds.  Abdominal:     General: Bowel sounds are normal.     Palpations: Abdomen is soft.  Skin:    General: Skin is warm and dry.  Neurological:     Mental Status: She is alert and oriented to person, place, and time.         Latest Ref Rng & Units 12/21/2020    1:21 PM  CMP  Glucose 70 - 99 mg/dL 217   BUN 6 - 20 mg/dL 13   Creatinine 0.44 - 1.00 mg/dL 0.89   Sodium 135 - 145 mmol/L 137   Potassium 3.5 - 5.1 mmol/L 3.6   Chloride 98 - 111 mmol/L 102   CO2 22 - 32 mmol/L 27   Calcium 8.9 - 10.3 mg/dL 9.5       Latest Ref Rng & Units 11/06/2021    1:10 PM   CBC  WBC 4.0 - 10.5 K/uL 11.8   Hemoglobin 12.0 - 15.0 g/dL 13.1   Hematocrit 36.0 - 46.0 % 39.8   Platelets 150 - 400 K/uL 331      Assessment and plan- Patient is a 61 y.o. female who is here for follow-up of following issues:  Leukocytosis/neutrophilia: This is a chronic problem with a white cell count that fluctuates between 8-14.  Presently it is 11.8 with a normal differential.  Prior work-up has been unremarkable.  Continue to monitor  Iron deficiency anemia.  Patient is not presently anemic with a hemoglobin of 13.1.  Ferritin levels are normal at 62 and iron studies are normal.  Patient does not require any IV iron at this time.  Repeat CBC ferritin and iron studies in 6 months in 1 year and I will see her back in 1 year   Visit Diagnosis 1. Leukocytosis, unspecified type   2. Iron deficiency anemia, unspecified iron deficiency anemia type      Dr. Randa Evens, MD, MPH [facility] at [facility] [phone] 11/06/2021 4:51 PM

## 2021-11-07 ENCOUNTER — Telehealth: Payer: Self-pay

## 2021-11-07 NOTE — Telephone Encounter (Signed)
Reached out to pts mothers (per her request) in regards to pt lab results and if she will require IV iron. IV iron is not reuqired at this time due to ferritin and iron numbers being normal. Not able to leave a VM due to VM not being set up. Have sent a MyChart message to relay information.

## 2021-11-28 DIAGNOSIS — Z7901 Long term (current) use of anticoagulants: Secondary | ICD-10-CM | POA: Diagnosis not present

## 2021-12-01 ENCOUNTER — Other Ambulatory Visit: Payer: Self-pay | Admitting: *Deleted

## 2021-12-01 NOTE — Patient Outreach (Signed)
  Care Coordination   12/01/2021 Name: Brianna Harris MRN: [id number] DOB: March 08, 1961   Care Coordination Outreach Attempts:  An unsuccessful telephone outreach was attempted today to offer the patient information about available care coordination services as a benefit of their health plan.   Follow Up Plan:  Additional outreach attempts will be made to offer the patient care coordination information and services.   Encounter Outcome:  No Answer  Care Coordination Interventions Activated:  No   Care Coordination Interventions:  No, not indicated    Emelia Loron RN, BSN [place] [phone] [email or web address]

## 2021-12-08 DIAGNOSIS — R300 Dysuria: Secondary | ICD-10-CM | POA: Diagnosis not present

## 2021-12-08 DIAGNOSIS — N760 Acute vaginitis: Secondary | ICD-10-CM | POA: Diagnosis not present

## 2021-12-08 DIAGNOSIS — E119 Type 2 diabetes mellitus without complications: Secondary | ICD-10-CM | POA: Diagnosis not present

## 2021-12-21 DIAGNOSIS — Z7901 Long term (current) use of anticoagulants: Secondary | ICD-10-CM | POA: Diagnosis not present

## 2021-12-28 DIAGNOSIS — Z7901 Long term (current) use of anticoagulants: Secondary | ICD-10-CM | POA: Diagnosis not present

## 2022-01-04 DIAGNOSIS — Z7901 Long term (current) use of anticoagulants: Secondary | ICD-10-CM | POA: Diagnosis not present

## 2022-01-08 ENCOUNTER — Other Ambulatory Visit: Payer: Self-pay

## 2022-01-08 ENCOUNTER — Emergency Department: Payer: PPO

## 2022-01-08 ENCOUNTER — Emergency Department
Admission: EM | Admit: 2022-01-08 | Discharge: 2022-01-08 | Disposition: A | Discharge status: Home / Self Care | Payer: PPO | Attending: Emergency Medicine | Admitting: Emergency Medicine

## 2022-01-08 ENCOUNTER — Encounter: Payer: Self-pay | Admitting: Emergency Medicine

## 2022-01-08 DIAGNOSIS — I639 Cerebral infarction, unspecified: Secondary | ICD-10-CM | POA: Diagnosis not present

## 2022-01-08 DIAGNOSIS — N390 Urinary tract infection, site not specified: Secondary | ICD-10-CM

## 2022-01-08 DIAGNOSIS — E119 Type 2 diabetes mellitus without complications: Secondary | ICD-10-CM | POA: Diagnosis not present

## 2022-01-08 DIAGNOSIS — J45909 Unspecified asthma, uncomplicated: Secondary | ICD-10-CM | POA: Insufficient documentation

## 2022-01-08 DIAGNOSIS — M19031 Primary osteoarthritis, right wrist: Secondary | ICD-10-CM | POA: Diagnosis not present

## 2022-01-08 DIAGNOSIS — W010XXA Fall on same level from slipping, tripping and stumbling without subsequent striking against object, initial encounter: Secondary | ICD-10-CM | POA: Insufficient documentation

## 2022-01-08 DIAGNOSIS — R739 Hyperglycemia, unspecified: Secondary | ICD-10-CM

## 2022-01-08 DIAGNOSIS — I672 Cerebral atherosclerosis: Secondary | ICD-10-CM | POA: Diagnosis not present

## 2022-01-08 DIAGNOSIS — E1165 Type 2 diabetes mellitus with hyperglycemia: Secondary | ICD-10-CM | POA: Diagnosis not present

## 2022-01-08 DIAGNOSIS — R531 Weakness: Secondary | ICD-10-CM | POA: Diagnosis present

## 2022-01-08 DIAGNOSIS — I10 Essential (primary) hypertension: Secondary | ICD-10-CM | POA: Insufficient documentation

## 2022-01-08 DIAGNOSIS — M189 Osteoarthritis of first carpometacarpal joint, unspecified: Secondary | ICD-10-CM | POA: Diagnosis not present

## 2022-01-08 DIAGNOSIS — M25531 Pain in right wrist: Secondary | ICD-10-CM | POA: Insufficient documentation

## 2022-01-08 LAB — URINALYSIS, ROUTINE W REFLEX MICROSCOPIC
Bilirubin Urine: NEGATIVE
Glucose, UA: >=500 mg/dL — AB
Ketones, ur: NEGATIVE mg/dL
Nitrite: NEGATIVE
Protein, ur: NEGATIVE mg/dL
Specific Gravity, Urine: 1.024 (ref 1.005–1.030)
pH: 5 (ref 5.0–8.0)

## 2022-01-08 LAB — BASIC METABOLIC PANEL
Anion gap: 10 (ref 5–15)
BUN: 17 mg/dL (ref 6–20)
CO2: 29 mmol/L (ref 22–32)
Calcium: 9.8 mg/dL (ref 8.9–10.3)
Chloride: 95 mmol/L — ABNORMAL LOW (ref 98–111)
Creatinine, Ser: 0.85 mg/dL (ref 0.44–1.00)
GFR, Estimated: >60 mL/min (ref >60)
Glucose, Bld: 393 mg/dL — ABNORMAL HIGH (ref 70–99)
Potassium: 3.9 mmol/L (ref 3.5–5.1)
Sodium: 134 mmol/L — ABNORMAL LOW (ref 135–145)

## 2022-01-08 LAB — CBG MONITORING, ED
Glucose-Capillary: 398 mg/dL — ABNORMAL HIGH (ref 70–99)
Glucose-Capillary: 427 mg/dL — ABNORMAL HIGH (ref 70–99)

## 2022-01-08 LAB — CBC
HCT: 42.4 % (ref 36.0–46.0)
Hemoglobin: 14.3 g/dL (ref 12.0–15.0)
MCH: 27.9 pg (ref 26.0–34.0)
MCHC: 33.7 g/dL (ref 30.0–36.0)
MCV: 82.8 fL (ref 80.0–100.0)
Platelets: 343 10*3/uL (ref 150–400)
RBC: 5.12 MIL/uL — ABNORMAL HIGH (ref 3.87–5.11)
RDW: 13.4 % (ref 11.5–15.5)
WBC: 9.6 10*3/uL (ref 4.0–10.5)
nRBC: 0 % (ref 0.0–0.2)

## 2022-01-08 MED ORDER — INSULIN ASPART 100 UNIT/ML IJ SOLN
6.0000 [IU] | Freq: Once | INTRAMUSCULAR | Status: AC
  Administered 2022-01-08: 6 [IU] via SUBCUTANEOUS
  Filled 2022-01-08: qty 1

## 2022-01-08 MED ORDER — CEPHALEXIN 500 MG PO CAPS
500.0000 mg | ORAL_CAPSULE | Freq: Once | ORAL | Status: AC
  Administered 2022-01-08: 500 mg via ORAL
  Filled 2022-01-08: qty 1

## 2022-01-08 MED ORDER — CEPHALEXIN 500 MG PO CAPS: 500.0000 mg | ORAL_CAPSULE | Freq: Two times a day (BID) | ORAL | 0 refills | Status: AC

## 2022-01-08 MED ORDER — SODIUM CHLORIDE 0.9 % IV BOLUS
1000.0000 mL | Freq: Once | INTRAVENOUS | Status: AC
  Administered 2022-01-08: 1000 mL via INTRAVENOUS

## 2022-01-08 NOTE — Discharge Instructions (Addendum)
Call your doctor tomorrow morning to discuss blood sugar management. Take Antibiotics for the full course as prescribed  Thank you for choosing [place] for your health care today!  Please see your primary doctor this week for a follow up appointment.   If you do not have a primary doctor call the following clinics to establish care:  If you have insurance:  [facility] [phone] [place] [address]   [facility]  [phone] [address] [place] [address]   If you do not have insurance:  [facility]  [phone] [address], [place] [address]  Sometimes, in the early stages of certain disease courses it is difficult to detect in the emergency department evaluation -- so, it is important that you continue to monitor your symptoms and call your doctor right away or return to the emergency department if you develop any new or worsening symptoms.  It was my pleasure to care for you today.   Hoover Brunette Jacelyn Grip, MD

## 2022-01-08 NOTE — ED Provider Notes (Signed)
[facility] Provider Note    Event Date/Time   First MD Initiated Contact with Patient 01/08/22 1410     (approximate)   History   Fall and Weakness   HPI  HPI obtained via video sign language interpreter  Brianna Harris is a 61 y.o. female with a history of diabetes, asthma, hypertension, hyperlipidemia, and deafness who presents with generalized weakness over the last few days and 2 falls during which she hit her head in the right wrist.  She reports pain in the right wrist.  She denies other injuries.  The patient states that she currently is not feeling weak or dizzy and did not feel lightheaded or presyncopal before falling.  She states that she tripped and fell and then felt dizzy afterwards.  However, she endorses increased urination and thirst over the last several days although no dysuria.  She has no abdominal pain, nausea, or vomiting.  She is on metformin.  She previously was on insulin but it was dropping her sugar too low.  She had a glucose of 370 last night after dinner.      Physical Exam   Triage Vital Signs: ED Triage Vitals  Enc Vitals Group     BP 01/08/22 1114 (!) 151/89     Pulse Rate 01/08/22 1110 80     Resp 01/08/22 1110 18     Temp 01/08/22 1110 97.8 F (36.6 C)     Temp Source 01/08/22 1110 Oral     SpO2 01/08/22 1114 95 %     Weight 01/08/22 1101 130 lb (59 kg)     Height 01/08/22 1101 '5\' 2"'$  (1.575 m)     Head Circumference --      Peak Flow --      Pain Score 01/08/22 1101 7     Pain Loc --      Pain Edu? --      Excl. in [employer]? --     Most recent vital signs: Vitals:   01/08/22 1110 01/08/22 1114  BP:  (!) 151/89  Pulse: 80   Resp: 18   Temp: 97.8 F (36.6 C)   SpO2:  95%     General: Awake, no distress.  CV:  Good peripheral perfusion.  Resp:  Normal effort.  Abd:  No distention.  Other:  EOMI.  PERRLA.  No facial droop.  5/5 motor strength and intact sensation of bilateral upper and lower  extremities.  No ataxia.  Minimal tenderness to the right wrist with no deformity.  2+ radial pulse.  Motor and sensory intact in all distributions of the right hand.   ED Results / Procedures / Treatments   Labs (all labs ordered are listed, but only abnormal results are displayed) Labs Reviewed  BASIC METABOLIC PANEL - Abnormal; Notable for the following components:      Result Value   Sodium 134 (*)    Chloride 95 (*)    Glucose, Bld 393 (*)    All other components within normal limits  CBC - Abnormal; Notable for the following components:   RBC 5.12 (*)    All other components within normal limits  URINALYSIS, ROUTINE W REFLEX MICROSCOPIC - Abnormal; Notable for the following components:   Color, Urine YELLOW (*)    APPearance HAZY (*)    Glucose, UA >=500 (*)    Hgb urine dipstick SMALL (*)    Leukocytes,Ua MODERATE (*)    Bacteria, UA RARE (*)  All other components within normal limits  CBG MONITORING, ED - Abnormal; Notable for the following components:   Glucose-Capillary 398 (*)    All other components within normal limits  CBG MONITORING, ED     EKG     RADIOLOGY  XR R wrist: I independently viewed and interpreted the images; there is no acute fracture  CT head: No ICH or other acute abnormality  PROCEDURES:  Critical Care performed: No  Procedures   MEDICATIONS ORDERED IN ED: Medications  sodium chloride 0.9 % bolus 1,000 mL (1,000 mLs Intravenous New Bag/Given 01/08/22 1525)  cephALEXin (KEFLEX) capsule 500 mg (500 mg Oral Given 01/08/22 1526)  insulin aspart (novoLOG) injection 6 Units (6 Units Subcutaneous Given 01/08/22 1526)     IMPRESSION / MDM / ASSESSMENT AND PLAN / ED COURSE  I reviewed the triage vital signs and the nursing notes.  61 year old female with PMH as noted above presents with some generalized weakness, falls x2, as well as polydipsia and polyuria and elevated blood glucose over the last several days.  I reviewed the past  medical records.  The patient follows with hematology/oncology for leukocytosis and was last seen in July.  She was seen at the [facility] walk-in clinic today due to this acute presentation and was referred to the ED.  Physical exam is unremarkable.  Thorough neurologic exam is normal.  Differential diagnosis includes, but is not limited to, hyperglycemia, DKA, dehydration, other metabolic disturbance, UTI or other infection.  I have a low suspicion for stroke or other CNS cause.  I also do not suspect ICH.  Patient's presentation is most consistent with acute presentation with potential threat to life or bodily function.  CT head is negative for acute findings.  Initial lab work-up reveals elevated glucose with a normal anion gap, otherwise reassuring electrolytes, and no leukocytosis.  Urinalysis does show some WBCs, leukocytes, and bacteria.  The patient is on the cardiac monitor to evaluate for evidence of arrhythmia and/or significant heart rate changes.  We will obtain an x-ray of the right wrist, give fluids and insulin, and reassess.  I suspect that the patient may be having hypoglycemia due to UTI.  I did consider whether the patient may benefit from inpatient admission, however she expresses a strong desire to go home if at all possible.  Given her normal vital signs and neurologic exam I feel that this is reasonable.  She denies feeling acutely weak or out of balance at this time.  Once she receives the fluids and medication we will reassess but if the patient is feeling better anticipate discharge home with oral antibiotics.  I signed the patient out to the oncoming ED physician Dr. Ronalee Red.   FINAL CLINICAL IMPRESSION(S) / ED DIAGNOSES   Final diagnoses:  Hyperglycemia  Urinary tract infection without hematuria, site unspecified     Rx / DC Orders   ED Discharge Orders          Ordered    cephALEXin (KEFLEX) 500 MG capsule  2 times daily        01/08/22 1523              Note:  This document was prepared using Dragon voice recognition software and may include unintentional dictation errors.    Arta Silence, MD 01/08/22 1623

## 2022-01-08 NOTE — ED Notes (Signed)
Pt brought over from [facility] with c/o multiple falls and is on thinners .

## 2022-01-08 NOTE — ED Triage Notes (Signed)
Pt was bib by friend POV complains of mutliple falls in past 2 days. Pt is diabetic and had increased urine and thirst lately. Pt states glucose was over 370 last night after dinner. Pt complains of left wrist pain from falling yesterday and is wearing a wrist splint from home.

## 2022-01-08 NOTE — ED Provider Notes (Signed)
Received this patient in signout with hyperglycemia and urinary tract infection. After insulin and fluids, blood sugar rose slightly.  Labs reviewed, no DKA.  Otherwise patient has been feeling well.  Unfortunately, her partner brought her food and Gatorade to drink after she received fluids and insulin, contributing to her largely unchanged blood sugar levels. I discussed with the patient and her partner the option to stay in the emergency department for further evaluation and management of her sugars and recheck, but she deferred and states that she would rather be discharged home at this time. I think that her rising blood sugars were likely due to her eating dinner along with Gatorade, and perhaps a component of her urinary tract infection for which we have a plan to treat. Given patient preference for discharge at this time, and agrees to follow-up closely with her primary doctor for blood sugar management, and return to the emergency department if worsening, she will be discharged at this time.  Lucillie Garfinkel MD   Lucillie Garfinkel, MD 01/08/22 [phone]

## 2022-01-09 DIAGNOSIS — H9193 Unspecified hearing loss, bilateral: Secondary | ICD-10-CM | POA: Diagnosis not present

## 2022-01-09 DIAGNOSIS — E1165 Type 2 diabetes mellitus with hyperglycemia: Secondary | ICD-10-CM | POA: Diagnosis not present

## 2022-01-11 DIAGNOSIS — E119 Type 2 diabetes mellitus without complications: Secondary | ICD-10-CM | POA: Diagnosis not present

## 2022-01-11 DIAGNOSIS — E1165 Type 2 diabetes mellitus with hyperglycemia: Secondary | ICD-10-CM | POA: Diagnosis not present

## 2022-01-17 ENCOUNTER — Telehealth: Payer: Self-pay

## 2022-01-17 NOTE — Telephone Encounter (Signed)
        Patient  visited [place] on 9/18    Telephone encounter attempt :1st    A HIPAA compliant voice message was left requesting a return call.  Instructed patient to call back    [place], [facility] [employer]  [phone] [address], [place], [place] [address] Phone: [phone] Email: [email or web address]

## 2022-01-18 ENCOUNTER — Telehealth: Payer: Self-pay

## 2022-01-18 NOTE — Telephone Encounter (Signed)
        Patient  visited [place] on 9/18   Telephone encounter attempt :  2nd  A HIPAA compliant voice message was left requesting a return call.  Instructed patient to call back     [place], [facility] [employer]  [phone] [address], [place], [place] [address] Phone: [phone] Email: [email or web address]

## 2022-01-19 DIAGNOSIS — E119 Type 2 diabetes mellitus without complications: Secondary | ICD-10-CM | POA: Diagnosis not present

## 2022-01-22 DIAGNOSIS — H2511 Age-related nuclear cataract, right eye: Secondary | ICD-10-CM | POA: Diagnosis not present

## 2022-01-25 DIAGNOSIS — Z7901 Long term (current) use of anticoagulants: Secondary | ICD-10-CM | POA: Diagnosis not present

## 2022-01-25 DIAGNOSIS — E119 Type 2 diabetes mellitus without complications: Secondary | ICD-10-CM | POA: Diagnosis not present

## 2022-02-06 DIAGNOSIS — E119 Type 2 diabetes mellitus without complications: Secondary | ICD-10-CM | POA: Diagnosis not present

## 2022-02-06 DIAGNOSIS — Z952 Presence of prosthetic heart valve: Secondary | ICD-10-CM | POA: Diagnosis not present

## 2022-02-06 DIAGNOSIS — E7801 Familial hypercholesterolemia: Secondary | ICD-10-CM | POA: Diagnosis not present

## 2022-02-06 DIAGNOSIS — K760 Fatty (change of) liver, not elsewhere classified: Secondary | ICD-10-CM | POA: Diagnosis not present

## 2022-02-06 DIAGNOSIS — I10 Essential (primary) hypertension: Secondary | ICD-10-CM | POA: Diagnosis not present

## 2022-02-06 DIAGNOSIS — F32A Depression, unspecified: Secondary | ICD-10-CM | POA: Diagnosis not present

## 2022-02-06 DIAGNOSIS — H9193 Unspecified hearing loss, bilateral: Secondary | ICD-10-CM | POA: Diagnosis not present

## 2022-02-07 DIAGNOSIS — I10 Essential (primary) hypertension: Secondary | ICD-10-CM | POA: Diagnosis not present

## 2022-02-07 DIAGNOSIS — E7801 Familial hypercholesterolemia: Secondary | ICD-10-CM | POA: Diagnosis not present

## 2022-02-07 DIAGNOSIS — E1165 Type 2 diabetes mellitus with hyperglycemia: Secondary | ICD-10-CM | POA: Diagnosis not present

## 2022-02-07 DIAGNOSIS — F32A Depression, unspecified: Secondary | ICD-10-CM | POA: Diagnosis not present

## 2022-02-13 ENCOUNTER — Encounter: Payer: Self-pay | Admitting: *Deleted

## 2022-02-13 ENCOUNTER — Encounter: Payer: PPO | Attending: Internal Medicine | Admitting: *Deleted

## 2022-02-13 VITALS — BP 124/70 | Ht 62.0 in | Wt 124.0 lb

## 2022-02-13 DIAGNOSIS — Z713 Dietary counseling and surveillance: Secondary | ICD-10-CM | POA: Insufficient documentation

## 2022-02-13 DIAGNOSIS — E118 Type 2 diabetes mellitus with unspecified complications: Secondary | ICD-10-CM | POA: Diagnosis not present

## 2022-02-13 DIAGNOSIS — E119 Type 2 diabetes mellitus without complications: Secondary | ICD-10-CM

## 2022-02-13 NOTE — Patient Instructions (Addendum)
Check blood sugars before meals, 2 hrs after meals, at bedtime and as needed with FreeStyle Libre Bring blood sugar records/reader to the next appointment  Exercise: Continue walking for    15  minutes   7  days a week and gradually increase to 150 minutes/week  Eat 3 meals day,   1-2  snacks a day Space meals 4-6 hours apart Don't skip meals - eat 1 protein and 1 carbohydrate serving  Complete 3 Day Food Record and bring to next appointment (Mom will complete)  Next appointment Monday March 19, 2022 at 3:00 pm with Freda Munro (nurse)

## 2022-02-13 NOTE — Progress Notes (Signed)
Diabetes Self-Management Education  Visit Type: First/Initial  Appt. Start Time: 1015 Appt. End Time: 1200  02/13/2022  [name], identified by name and date of birth, is a 61 y.o. female with a diagnosis of Diabetes: Type 2.   ASSESSMENT  Blood pressure 124/70, height '5\' 2"'$  (1.575 m), weight 124 lb (56.2 kg). Body mass index is 22.68 kg/m.   Diabetes Self-Management Education - 02/13/22 1219       Visit Information   Visit Type First/Initial      Initial Visit   Diabetes Type Type 2    Date Diagnosed unsure    Are you currently following a meal plan? Yes    What type of meal plan do you follow? "less fried foods"    Are you taking your medications as prescribed? Yes      Health Coping   How would you rate your overall health? Good      Psychosocial Assessment   Patient Belief/Attitude about Diabetes Other (comment)   "feel the same"   What is the hardest part about your diabetes right now, causing you the most concern, or is the most worrisome to you about your diabetes?   Making healty food and beverage choices    Self-care barriers Hard of hearing   Needs sign language interpreter   Self-management support Doctor's office;Friends;Family    Other persons present Interpreter;Parent;Companion   Brianna Harris - sign language, mother Brianna Harris, room mate Brianna Harris   Patient Concerns Nutrition/Meal planning;Medication;Monitoring;Glycemic Control    Special Needs Simplified materials;Instruct caregiver;Other (comment)   Sign language Interpreter   Preferred Learning Style Other (comment)   discussion; sign language   Learning Readiness Ready    How often do you need to have someone help you when you read instructions, pamphlets, or other written materials from your doctor or pharmacy? 4 - Often    What is the last grade level you completed in school? 12th      Pre-Education Assessment   Patient understands the diabetes disease and treatment process. Needs Instruction     Patient understands incorporating nutritional management into lifestyle. Needs Instruction    Patient undertands incorporating physical activity into lifestyle. Needs Review    Patient understands using medications safely. Needs Instruction    Patient understands monitoring blood glucose, interpreting and using results Needs Review    Patient understands prevention, detection, and treatment of acute complications. Needs Instruction    Patient understands prevention, detection, and treatment of chronic complications. Needs Review    Patient understands how to develop strategies to address psychosocial issues. Needs Instruction    Patient understands how to develop strategies to promote health/change behavior. Needs Instruction      Complications   Last HgB A1C per patient/outside source 12.2 %   01/25/2022   How often do you check your blood sugar? 3-4 times/day   She has a [employer] 2. Has been wearing for a week. Time in range - 100% Avg BG 122 mg/dL   Have you had a dilated eye exam in the past 12 months? Yes    Have you had a dental exam in the past 12 months? No    Are you checking your feet? No      Dietary Intake   Breakfast cereal and milk; bagel; oatmeal; boiled eggs, banana    Lunch "snack" - pretzels, nabs, ice cream    Dinner eats out for supper - chicken and dumplings, grilled chicken, chicken pie, meatloaf, fried fish; frozen  dinners; salads, potatoes, fried okra, peas, corn, squash, cuccumbers    Snack (evening) pretzels    Beverage(s) water, unsweetened tea, Crystal light      Activity / Exercise   Activity / Exercise Type Light (walking / raking leaves)    How many days per week do you exercise? 7    How many minutes per day do you exercise? 15    Total minutes per week of exercise 105      Patient Education   Previous Diabetes Education Yes (please comment)   Here 2017   Disease Pathophysiology Definition of diabetes, type 1 and 2, and the diagnosis of  diabetes;Explored patient's options for treatment of their diabetes    Healthy Eating Role of diet in the treatment of diabetes and the relationship between the three main macronutrients and blood glucose level;Plate Method;Reviewed blood glucose goals for pre and post meals and how to evaluate the patients' food intake on their blood glucose level.;Information on hints to eating out and maintain blood glucose control.    Being Active Role of exercise on diabetes management, blood pressure control and cardiac health.    Medications Taught/reviewed insulin/injectables, injection, site rotation, insulin/injectables storage and needle disposal.;Reviewed patients medication for diabetes, action, purpose, timing of dose and side effects.    Monitoring Taught/evaluated CGM (comment);Taught/discussed recording of test results and interpretation of SMBG.;Identified appropriate SMBG and/or A1C goals.    Chronic complications Relationship between chronic complications and blood glucose control    Diabetes Stress and Support Identified and addressed patients feelings and concerns about diabetes      Individualized Goals (developed by patient)   Reducing Risk Other (comment)   improve blood sugars, decrease medications, prevent diabetes complications, lead a healthier lifestyle     Outcomes   Expected Outcomes Demonstrated interest in learning. Expect positive outcomes    Future DMSE 4-6 wks         Individualized Plan for Diabetes Self-Management Training:   Learning Objective:  Patient will have a greater understanding of diabetes self-management. Patient education plan is to attend individual and/or group sessions per assessed needs and concerns.   Plan:   Patient Instructions  Check blood sugars before meals, 2 hrs after meals, at bedtime and as needed with FreeStyle Libre Bring blood sugar records/reader to the next appointment  Exercise: Continue walking for    15  minutes   7  days a week and  gradually increase to 150 minutes/week  Eat 3 meals day,   1-2  snacks a day Space meals 4-6 hours apart Don't skip meals - eat 1 protein and 1 carbohydrate serving  Complete 3 Day Food Record and bring to next appointment (Mom will complete)  Next appointment Monday March 19, 2022 at 3:00 pm with Freda Munro (nurse)  Expected Outcomes:  Demonstrated interest in learning. Expect positive outcomes  Education material provided:  General Meal Planning Guidelines Simple Meal Plan 3 Day Food Record Symptoms, causes and treatments of Hypoglycemia Diabetes Plate Method (ADA) FreeStyle Libre AVS  If problems or questions, patient to contact team via:   Johny Drilling, RN, [place] [phone]  Future DSME appointment: 4-6 wks March 19, 2022 with this educator

## 2022-02-20 DIAGNOSIS — I10 Essential (primary) hypertension: Secondary | ICD-10-CM | POA: Diagnosis not present

## 2022-02-20 DIAGNOSIS — I700 Atherosclerosis of aorta: Secondary | ICD-10-CM | POA: Diagnosis not present

## 2022-02-20 DIAGNOSIS — E7801 Familial hypercholesterolemia: Secondary | ICD-10-CM | POA: Diagnosis not present

## 2022-02-20 DIAGNOSIS — H9193 Unspecified hearing loss, bilateral: Secondary | ICD-10-CM | POA: Diagnosis not present

## 2022-02-20 DIAGNOSIS — Z7901 Long term (current) use of anticoagulants: Secondary | ICD-10-CM | POA: Diagnosis not present

## 2022-02-20 DIAGNOSIS — Z950 Presence of cardiac pacemaker: Secondary | ICD-10-CM | POA: Diagnosis not present

## 2022-02-20 DIAGNOSIS — E1165 Type 2 diabetes mellitus with hyperglycemia: Secondary | ICD-10-CM | POA: Diagnosis not present

## 2022-02-20 DIAGNOSIS — Z952 Presence of prosthetic heart valve: Secondary | ICD-10-CM | POA: Diagnosis not present

## 2022-02-23 DIAGNOSIS — E119 Type 2 diabetes mellitus without complications: Secondary | ICD-10-CM | POA: Diagnosis not present

## 2022-03-11 DIAGNOSIS — Z03818 Encounter for observation for suspected exposure to other biological agents ruled out: Secondary | ICD-10-CM | POA: Diagnosis not present

## 2022-03-11 DIAGNOSIS — J019 Acute sinusitis, unspecified: Secondary | ICD-10-CM | POA: Diagnosis not present

## 2022-03-11 DIAGNOSIS — B9689 Other specified bacterial agents as the cause of diseases classified elsewhere: Secondary | ICD-10-CM | POA: Diagnosis not present

## 2022-03-19 ENCOUNTER — Ambulatory Visit: Payer: PPO | Admitting: *Deleted

## 2022-03-19 ENCOUNTER — Encounter: Payer: Self-pay | Admitting: *Deleted

## 2022-03-20 DIAGNOSIS — Z7901 Long term (current) use of anticoagulants: Secondary | ICD-10-CM | POA: Diagnosis not present

## 2022-03-21 ENCOUNTER — Emergency Department
Admission: EM | Admit: 2022-03-21 | Discharge: 2022-03-21 | Disposition: A | Discharge status: Home / Self Care | Payer: PPO | Attending: Emergency Medicine | Admitting: Emergency Medicine

## 2022-03-21 ENCOUNTER — Encounter: Payer: Self-pay | Admitting: *Deleted

## 2022-03-21 ENCOUNTER — Emergency Department: Payer: PPO

## 2022-03-21 ENCOUNTER — Other Ambulatory Visit: Payer: Self-pay

## 2022-03-21 DIAGNOSIS — Z20822 Contact with and (suspected) exposure to covid-19: Secondary | ICD-10-CM | POA: Insufficient documentation

## 2022-03-21 DIAGNOSIS — R109 Unspecified abdominal pain: Secondary | ICD-10-CM | POA: Diagnosis not present

## 2022-03-21 DIAGNOSIS — R112 Nausea with vomiting, unspecified: Secondary | ICD-10-CM | POA: Diagnosis not present

## 2022-03-21 DIAGNOSIS — J209 Acute bronchitis, unspecified: Secondary | ICD-10-CM | POA: Diagnosis not present

## 2022-03-21 DIAGNOSIS — I10 Essential (primary) hypertension: Secondary | ICD-10-CM | POA: Diagnosis not present

## 2022-03-21 DIAGNOSIS — R059 Cough, unspecified: Secondary | ICD-10-CM | POA: Diagnosis not present

## 2022-03-21 DIAGNOSIS — K5730 Diverticulosis of large intestine without perforation or abscess without bleeding: Secondary | ICD-10-CM | POA: Diagnosis not present

## 2022-03-21 LAB — COMPREHENSIVE METABOLIC PANEL
ALT: 42 U/L (ref 0–44)
AST: 59 U/L — ABNORMAL HIGH (ref 15–41)
Albumin: 4.8 g/dL (ref 3.5–5.0)
Alkaline Phosphatase: 84 U/L (ref 38–126)
Anion gap: 11 (ref 5–15)
BUN: 15 mg/dL (ref 6–20)
CO2: 29 mmol/L (ref 22–32)
Calcium: 10.1 mg/dL (ref 8.9–10.3)
Chloride: 104 mmol/L (ref 98–111)
Creatinine, Ser: 0.97 mg/dL (ref 0.44–1.00)
GFR, Estimated: >60 mL/min (ref >60)
Glucose, Bld: 135 mg/dL — ABNORMAL HIGH (ref 70–99)
Potassium: 3.7 mmol/L (ref 3.5–5.1)
Sodium: 144 mmol/L (ref 135–145)
Total Bilirubin: 1.6 mg/dL — ABNORMAL HIGH (ref 0.3–1.2)
Total Protein: 8.6 g/dL — ABNORMAL HIGH (ref 6.5–8.1)

## 2022-03-21 LAB — URINALYSIS, ROUTINE W REFLEX MICROSCOPIC
Bilirubin Urine: NEGATIVE
Glucose, UA: NEGATIVE mg/dL
Hgb urine dipstick: NEGATIVE
Ketones, ur: NEGATIVE mg/dL
Leukocytes,Ua: NEGATIVE
Nitrite: NEGATIVE
Protein, ur: 30 mg/dL — AB
Specific Gravity, Urine: >1.046 — ABNORMAL HIGH (ref 1.005–1.030)
pH: 6 (ref 5.0–8.0)

## 2022-03-21 LAB — RESP PANEL BY RT-PCR (FLU A&B, COVID) ARPGX2
Influenza A by PCR: NEGATIVE
Influenza B by PCR: NEGATIVE
SARS Coronavirus 2 by RT PCR: NEGATIVE

## 2022-03-21 LAB — CBC
HCT: 45.9 % (ref 36.0–46.0)
Hemoglobin: 15.2 g/dL — ABNORMAL HIGH (ref 12.0–15.0)
MCH: 28.5 pg (ref 26.0–34.0)
MCHC: 33.1 g/dL (ref 30.0–36.0)
MCV: 86.1 fL (ref 80.0–100.0)
Platelets: 476 10*3/uL — ABNORMAL HIGH (ref 150–400)
RBC: 5.33 MIL/uL — ABNORMAL HIGH (ref 3.87–5.11)
RDW: 13.9 % (ref 11.5–15.5)
WBC: 16.6 10*3/uL — ABNORMAL HIGH (ref 4.0–10.5)
nRBC: 0 % (ref 0.0–0.2)

## 2022-03-21 LAB — LIPASE, BLOOD: Lipase: 57 U/L — ABNORMAL HIGH (ref 11–51)

## 2022-03-21 MED ORDER — AZITHROMYCIN 500 MG PO TABS
500.0000 mg | ORAL_TABLET | Freq: Once | ORAL | Status: AC
  Administered 2022-03-21: 500 mg via ORAL
  Filled 2022-03-21: qty 1

## 2022-03-21 MED ORDER — PREDNISONE 10 MG PO TABS: 10.0000 mg | ORAL_TABLET | Freq: Every day | ORAL | 0 refills | Status: DC

## 2022-03-21 MED ORDER — WARFARIN SODIUM 3 MG PO TABS
3.0000 mg | ORAL_TABLET | Freq: Once | ORAL | Status: AC
  Administered 2022-03-21: 3 mg via ORAL
  Filled 2022-03-21: qty 1

## 2022-03-21 MED ORDER — ONDANSETRON 4 MG PO TBDP: 4.0000 mg | ORAL_TABLET | Freq: Three times a day (TID) | ORAL | 0 refills | Status: DC | PRN

## 2022-03-21 MED ORDER — ONDANSETRON HCL 4 MG/2ML IJ SOLN
4.0000 mg | Freq: Once | INTRAMUSCULAR | Status: AC
  Administered 2022-03-21: 4 mg via INTRAVENOUS
  Filled 2022-03-21: qty 2

## 2022-03-21 MED ORDER — AZITHROMYCIN 250 MG PO TABS: 250.0000 mg | ORAL_TABLET | Freq: Every day | ORAL | 0 refills | Status: DC

## 2022-03-21 MED ORDER — IOHEXOL 300 MG/ML  SOLN
100.0000 mL | Freq: Once | INTRAMUSCULAR | Status: AC | PRN
  Administered 2022-03-21: 100 mL via INTRAVENOUS

## 2022-03-21 MED ORDER — SODIUM CHLORIDE 0.9 % IV BOLUS
1000.0000 mL | Freq: Once | INTRAVENOUS | Status: AC
  Administered 2022-03-21: 1000 mL via INTRAVENOUS

## 2022-03-21 MED ORDER — METHYLPREDNISOLONE SODIUM SUCC 125 MG IJ SOLR
125.0000 mg | Freq: Once | INTRAMUSCULAR | Status: AC
  Administered 2022-03-21: 125 mg via INTRAVENOUS
  Filled 2022-03-21: qty 2

## 2022-03-21 NOTE — ED Provider Notes (Signed)
[facility] Provider Note    Event Date/Time   First MD Initiated Contact with Patient 03/21/22 1947     (approximate)  Sign language interpreter used for this evaluation  History   Chief Complaint: Abdominal Pain  HPI  Brianna Harris is a 61 y.o. female who is deaf, with a past medical history of gastric reflux, hypertension, hyperlipidemia presents to the emergency department with cough and vomiting.  According to the patient via sign language interpreter she has been coughing for the past 3 days with sputum production.  Patient saw a walk-in clinic and was prescribed an antibiotic and a cough medication dextromethorphan.  Patient states she took dextromethorphan this morning and had several episodes of vomiting at home and then several episodes in the emergency department prior to my evaluation.  Patient is complaining of abdominal pain mostly across the upper abdomen but states this started prior to the vomiting.  Patient denies any fever.  No urinary symptoms, no diarrhea.  Patient does have a frequent wet sounding cough in the emergency department.  Patient does not know which antibiotic was prescribed by the walk-in clinic.  Patient and her mother was here with her both appear to be fairly poor historians.  I have reviewed the patient's records and it appears that the patient called her PCP 03/14/2022 for a cough.  States the cough medication helped and she called once again 03/19/2022 asking for a refill of the cough medication.  It appears that dextromethorphan or cough medication was called in but no antibiotic appears to have ever been prescribed nothing that appears in her chart.  Physical Exam   Triage Vital Signs: ED Triage Vitals  Enc Vitals Group     BP 03/21/22 1744 (!) 149/100     Pulse Rate 03/21/22 1744 (!) 105     Resp 03/21/22 1744 18     Temp 03/21/22 1744 98.4 F (36.9 C)     Temp Source 03/21/22 1744 Oral     SpO2 03/21/22 1744 95 %      Weight 03/21/22 1745 123 lb 7.3 oz (56 kg)     Height 03/21/22 1745 '5\' 2"'$  (1.575 m)     Head Circumference --      Peak Flow --      Pain Score 03/21/22 1745 0     Pain Loc --      Pain Edu? --      Excl. in [place]? --     Most recent vital signs: Vitals:   03/21/22 1744  BP: (!) 149/100  Pulse: (!) 105  Resp: 18  Temp: 98.4 F (36.9 C)  SpO2: 95%    General: Awake, no distress.  CV:  Good peripheral perfusion.  Regular rate and rhythm  Resp:  Normal effort.  Equal breath sounds bilaterally.  No wheeze rales or rhonchi but does have a frequent wet sounding cough Abd:  No distention.  Soft, nontender.  No rebound or guarding.   ED Results / Procedures / Treatments   RADIOLOGY  Reviewed and interpreted the chest x-ray images.  I do not see any obvious consolidation on my evaluation. Radiology has read the chest x-ray as negative. CT scan of the abdomen is negative for acute abnormality.   MEDICATIONS ORDERED IN ED: Medications  sodium chloride 0.9 % bolus 1,000 mL (has no administration in time range)  ondansetron (ZOFRAN) injection 4 mg (has no administration in time range)     IMPRESSION / MDM /  ASSESSMENT AND PLAN / ED COURSE  I reviewed the triage vital signs and the nursing notes.  Patient's presentation is most consistent with acute presentation with potential threat to life or bodily function.  Patient presents to the emergency department with complaints of cough which appears to be ongoing for greater than 1 week although the patient reports 3 days.  Patient now had vomiting this morning as well as again this afternoon.  Patient is complaining of abdominal pain which could be related to the vomiting although the patient states she believes abdominal pain started before the vomiting.  Abdominal pain could also be related to the amount of coughing that the patient is doing.  Patient's lab work shows a very slight lipase elevation her chemistry is otherwise reassuring  besides very slight AST and bilirubin elevation.  Patient has fairly diffuse abdominal tenderness on my exam although mild.  No focal area of tenderness.  However given the mild LFT elevation and a CBC showing elevated white blood cell count of 16,000 and both patient and mother being rather poor historians we will proceed with further workup including CT imaging the abdomen/pelvis.  We will obtain a chest x-ray as well as a right upper quadrant ultrasound.  We will dose fluids nausea medication and continue to closely monitor while awaiting results.  Patient's chest x-ray is clear showing no signs of pneumonia.  Patient CT scan is negative.  Patient continues to cough with occasional clear sputum.  Clinical exam is very consistent with acute bronchitis.  Patient's mother was able to find a prescription for doxycycline that was written last week.  Discussed with the patient to discontinue the doxycycline if she has not yet completed it.  We will prescribe a 5-day course of Zithromax as well as a steroid taper.  Patient does take warfarin, the patient will need to have her warfarin level rechecked in approximately 2 to 3 days given the antibiotic use.  Patient to follow-up with her doctor.  FINAL CLINICAL IMPRESSION(S) / ED DIAGNOSES   Abdominal pain Nausea vomiting Cough Acute bronchitis  Rx / DC Orders   Zithromax Prednisone  Note:  This document was prepared using Dragon voice recognition software and may include unintentional dictation errors.   Harvest Dark, MD 03/21/22 2225

## 2022-03-21 NOTE — ED Notes (Signed)
This rn stuck pt for lab, no go, pt almost hit this rn swinging her arms and fist at nurse.    Mother with pt.  Lab called for labs.

## 2022-03-21 NOTE — Discharge Instructions (Addendum)
Please take your antibiotics and steroids as prescribed.  As you are on warfarin antibiotics can affect your warfarin level.  Please have your warfarin/Coumadin level rechecked in 2 to 3 days by your doctor.  You have also been prescribed a nausea medication which may be used as needed, as written.  Return to the emergency department for any trouble breathing or any other symptom personally concerning to yourself.

## 2022-03-21 NOTE — ED Triage Notes (Signed)
Pt is Deaf.  Interpreter on a stick used in triage.  Pt has abd pain and has vomiting x 3 today.  No diarrhea.  No back pain.  Pt alert.  Mother with pt

## 2022-03-22 ENCOUNTER — Telehealth: Payer: Self-pay

## 2022-03-22 NOTE — Telephone Encounter (Signed)
        Patient  visited [place] on 11/29  Telephone encounter attempt :  1st  A HIPAA compliant voice message was left requesting a return call.  Instructed patient to call back     [place], [employer]  [phone] [address], [place], [place] [address] Phone: [phone] Email: [email or web address]

## 2022-03-26 ENCOUNTER — Telehealth: Payer: Self-pay

## 2022-03-26 DIAGNOSIS — H9193 Unspecified hearing loss, bilateral: Secondary | ICD-10-CM | POA: Diagnosis not present

## 2022-03-26 DIAGNOSIS — E1165 Type 2 diabetes mellitus with hyperglycemia: Secondary | ICD-10-CM | POA: Diagnosis not present

## 2022-03-26 DIAGNOSIS — R791 Abnormal coagulation profile: Secondary | ICD-10-CM | POA: Diagnosis not present

## 2022-03-26 NOTE — Telephone Encounter (Signed)
        Patient  visited [place] on 11/30    Telephone encounter attempt :  2nd  A HIPAA compliant voice message was left requesting a return call.  Instructed patient to call back     [place], [place] [employer]  [phone] [address], [place], [place] [address] Phone: [phone] Email: [email or web address]

## 2022-03-29 DIAGNOSIS — Z7901 Long term (current) use of anticoagulants: Secondary | ICD-10-CM | POA: Diagnosis not present

## 2022-03-29 DIAGNOSIS — J40 Bronchitis, not specified as acute or chronic: Secondary | ICD-10-CM | POA: Diagnosis not present

## 2022-04-26 DIAGNOSIS — Z7901 Long term (current) use of anticoagulants: Secondary | ICD-10-CM | POA: Diagnosis not present

## 2022-04-26 DIAGNOSIS — E1165 Type 2 diabetes mellitus with hyperglycemia: Secondary | ICD-10-CM | POA: Diagnosis not present

## 2022-04-26 DIAGNOSIS — E119 Type 2 diabetes mellitus without complications: Secondary | ICD-10-CM | POA: Diagnosis not present

## 2022-05-09 ENCOUNTER — Inpatient Hospital Stay: Payer: PPO | Attending: Oncology

## 2022-05-09 DIAGNOSIS — I700 Atherosclerosis of aorta: Secondary | ICD-10-CM | POA: Diagnosis not present

## 2022-05-09 DIAGNOSIS — D509 Iron deficiency anemia, unspecified: Secondary | ICD-10-CM

## 2022-05-09 DIAGNOSIS — R569 Unspecified convulsions: Secondary | ICD-10-CM | POA: Diagnosis not present

## 2022-05-09 DIAGNOSIS — F32A Depression, unspecified: Secondary | ICD-10-CM | POA: Diagnosis not present

## 2022-05-09 DIAGNOSIS — E7801 Familial hypercholesterolemia: Secondary | ICD-10-CM | POA: Diagnosis not present

## 2022-05-09 DIAGNOSIS — H9193 Unspecified hearing loss, bilateral: Secondary | ICD-10-CM | POA: Diagnosis not present

## 2022-05-09 DIAGNOSIS — Z0000 Encounter for general adult medical examination without abnormal findings: Secondary | ICD-10-CM | POA: Diagnosis not present

## 2022-05-09 DIAGNOSIS — J4520 Mild intermittent asthma, uncomplicated: Secondary | ICD-10-CM | POA: Diagnosis not present

## 2022-05-09 DIAGNOSIS — R791 Abnormal coagulation profile: Secondary | ICD-10-CM | POA: Diagnosis not present

## 2022-05-09 DIAGNOSIS — E1165 Type 2 diabetes mellitus with hyperglycemia: Secondary | ICD-10-CM | POA: Diagnosis not present

## 2022-05-09 DIAGNOSIS — I10 Essential (primary) hypertension: Secondary | ICD-10-CM | POA: Diagnosis not present

## 2022-05-09 DIAGNOSIS — K760 Fatty (change of) liver, not elsewhere classified: Secondary | ICD-10-CM | POA: Diagnosis not present

## 2022-05-09 DIAGNOSIS — Z952 Presence of prosthetic heart valve: Secondary | ICD-10-CM | POA: Diagnosis not present

## 2022-05-09 LAB — CBC WITH DIFFERENTIAL/PLATELET
Abs Immature Granulocytes: 0.02 10*3/uL (ref 0.00–0.07)
Basophils Absolute: 0.1 10*3/uL (ref 0.0–0.1)
Basophils Relative: 1 %
Eosinophils Absolute: 0.3 10*3/uL (ref 0.0–0.5)
Eosinophils Relative: 3 %
HCT: 40 % (ref 36.0–46.0)
Hemoglobin: 13.3 g/dL (ref 12.0–15.0)
Immature Granulocytes: 0 %
Lymphocytes Relative: 36 %
Lymphs Abs: 3.5 10*3/uL (ref 0.7–4.0)
MCH: 29.2 pg (ref 26.0–34.0)
MCHC: 33.3 g/dL (ref 30.0–36.0)
MCV: 87.7 fL (ref 80.0–100.0)
Monocytes Absolute: 0.6 10*3/uL (ref 0.1–1.0)
Monocytes Relative: 6 %
Neutro Abs: 5.4 10*3/uL (ref 1.7–7.7)
Neutrophils Relative %: 54 %
Platelets: 311 10*3/uL (ref 150–400)
RBC: 4.56 MIL/uL (ref 3.87–5.11)
RDW: 13 % (ref 11.5–15.5)
WBC: 9.9 10*3/uL (ref 4.0–10.5)
nRBC: 0 % (ref 0.0–0.2)

## 2022-05-09 LAB — IRON AND TIBC
Iron: 45 ug/dL (ref 28–170)
Saturation Ratios: 10 % — ABNORMAL LOW (ref 10.4–31.8)
TIBC: 469 ug/dL — ABNORMAL HIGH (ref 250–450)
UIBC: 424 ug/dL

## 2022-05-09 LAB — FERRITIN: Ferritin: 19 ng/mL (ref 11–307)

## 2022-05-16 DIAGNOSIS — R791 Abnormal coagulation profile: Secondary | ICD-10-CM | POA: Diagnosis not present

## 2022-05-18 ENCOUNTER — Other Ambulatory Visit: Payer: Self-pay | Admitting: Oncology

## 2022-05-18 ENCOUNTER — Inpatient Hospital Stay: Payer: PPO

## 2022-05-18 VITALS — BP 165/80 | HR 74 | Temp 97.2°F | Resp 18

## 2022-05-18 DIAGNOSIS — D509 Iron deficiency anemia, unspecified: Secondary | ICD-10-CM | POA: Diagnosis not present

## 2022-05-18 DIAGNOSIS — D508 Other iron deficiency anemias: Secondary | ICD-10-CM

## 2022-05-18 MED ORDER — SODIUM CHLORIDE 0.9 % IV SOLN
Freq: Once | INTRAVENOUS | Status: AC
  Filled 2022-05-18: qty 250

## 2022-05-18 MED ORDER — SODIUM CHLORIDE 0.9 % IV SOLN
510.0000 mg | INTRAVENOUS | Status: DC
  Administered 2022-05-18: 510 mg via INTRAVENOUS
  Filled 2022-05-18: qty 17

## 2022-05-25 ENCOUNTER — Inpatient Hospital Stay: Payer: PPO | Attending: Oncology

## 2022-05-25 VITALS — BP 139/75 | HR 79 | Temp 97.2°F | Resp 18

## 2022-05-25 DIAGNOSIS — Z79899 Other long term (current) drug therapy: Secondary | ICD-10-CM | POA: Insufficient documentation

## 2022-05-25 DIAGNOSIS — D509 Iron deficiency anemia, unspecified: Secondary | ICD-10-CM | POA: Diagnosis not present

## 2022-05-25 DIAGNOSIS — D508 Other iron deficiency anemias: Secondary | ICD-10-CM

## 2022-05-25 MED ORDER — SODIUM CHLORIDE 0.9 % IV SOLN
510.0000 mg | INTRAVENOUS | Status: DC
  Administered 2022-05-25: 510 mg via INTRAVENOUS
  Filled 2022-05-25: qty 510

## 2022-05-25 MED ORDER — SODIUM CHLORIDE 0.9 % IV SOLN
Freq: Once | INTRAVENOUS | Status: AC
  Filled 2022-05-25: qty 250

## 2022-05-25 NOTE — Progress Notes (Signed)
Pt has been educated and understands. Pt refused to stay 30 mins after iron infusion.  Monitor interpreter used during treatment. VSS.

## 2022-06-12 DIAGNOSIS — E119 Type 2 diabetes mellitus without complications: Secondary | ICD-10-CM | POA: Diagnosis not present

## 2022-06-12 DIAGNOSIS — Z7901 Long term (current) use of anticoagulants: Secondary | ICD-10-CM | POA: Diagnosis not present

## 2022-06-12 DIAGNOSIS — H9193 Unspecified hearing loss, bilateral: Secondary | ICD-10-CM | POA: Diagnosis not present

## 2022-07-08 DIAGNOSIS — B9789 Other viral agents as the cause of diseases classified elsewhere: Secondary | ICD-10-CM | POA: Diagnosis not present

## 2022-07-08 DIAGNOSIS — Z03818 Encounter for observation for suspected exposure to other biological agents ruled out: Secondary | ICD-10-CM | POA: Diagnosis not present

## 2022-07-08 DIAGNOSIS — R051 Acute cough: Secondary | ICD-10-CM | POA: Diagnosis not present

## 2022-07-08 DIAGNOSIS — M62838 Other muscle spasm: Secondary | ICD-10-CM | POA: Diagnosis not present

## 2022-07-08 DIAGNOSIS — J988 Other specified respiratory disorders: Secondary | ICD-10-CM | POA: Diagnosis not present

## 2022-07-10 DIAGNOSIS — Z7901 Long term (current) use of anticoagulants: Secondary | ICD-10-CM | POA: Diagnosis not present

## 2022-08-01 DIAGNOSIS — I10 Essential (primary) hypertension: Secondary | ICD-10-CM | POA: Diagnosis not present

## 2022-08-01 DIAGNOSIS — I350 Nonrheumatic aortic (valve) stenosis: Secondary | ICD-10-CM | POA: Diagnosis not present

## 2022-08-01 DIAGNOSIS — Z952 Presence of prosthetic heart valve: Secondary | ICD-10-CM | POA: Diagnosis not present

## 2022-08-01 DIAGNOSIS — Z950 Presence of cardiac pacemaker: Secondary | ICD-10-CM | POA: Diagnosis not present

## 2022-08-01 DIAGNOSIS — E1165 Type 2 diabetes mellitus with hyperglycemia: Secondary | ICD-10-CM | POA: Diagnosis not present

## 2022-08-01 DIAGNOSIS — I700 Atherosclerosis of aorta: Secondary | ICD-10-CM | POA: Diagnosis not present

## 2022-08-01 DIAGNOSIS — I441 Atrioventricular block, second degree: Secondary | ICD-10-CM | POA: Diagnosis not present

## 2022-08-01 DIAGNOSIS — E7801 Familial hypercholesterolemia: Secondary | ICD-10-CM | POA: Diagnosis not present

## 2022-08-01 DIAGNOSIS — R001 Bradycardia, unspecified: Secondary | ICD-10-CM | POA: Diagnosis not present

## 2022-08-07 ENCOUNTER — Emergency Department: Payer: PPO

## 2022-08-07 ENCOUNTER — Emergency Department
Admission: EM | Admit: 2022-08-07 | Discharge: 2022-08-07 | Disposition: A | Discharge status: Home / Self Care | Payer: PPO | Attending: Emergency Medicine | Admitting: Emergency Medicine

## 2022-08-07 ENCOUNTER — Other Ambulatory Visit: Payer: Self-pay

## 2022-08-07 DIAGNOSIS — R262 Difficulty in walking, not elsewhere classified: Secondary | ICD-10-CM | POA: Diagnosis not present

## 2022-08-07 DIAGNOSIS — M545 Low back pain, unspecified: Secondary | ICD-10-CM | POA: Diagnosis not present

## 2022-08-07 DIAGNOSIS — S0990XA Unspecified injury of head, initial encounter: Secondary | ICD-10-CM | POA: Diagnosis not present

## 2022-08-07 DIAGNOSIS — Z7901 Long term (current) use of anticoagulants: Secondary | ICD-10-CM | POA: Diagnosis not present

## 2022-08-07 DIAGNOSIS — W01198A Fall on same level from slipping, tripping and stumbling with subsequent striking against other object, initial encounter: Secondary | ICD-10-CM | POA: Diagnosis not present

## 2022-08-07 DIAGNOSIS — E119 Type 2 diabetes mellitus without complications: Secondary | ICD-10-CM | POA: Insufficient documentation

## 2022-08-07 DIAGNOSIS — R519 Headache, unspecified: Secondary | ICD-10-CM | POA: Diagnosis not present

## 2022-08-07 DIAGNOSIS — I6381 Other cerebral infarction due to occlusion or stenosis of small artery: Secondary | ICD-10-CM | POA: Diagnosis not present

## 2022-08-07 DIAGNOSIS — I10 Essential (primary) hypertension: Secondary | ICD-10-CM | POA: Diagnosis not present

## 2022-08-07 DIAGNOSIS — W19XXXA Unspecified fall, initial encounter: Secondary | ICD-10-CM

## 2022-08-07 DIAGNOSIS — R102 Pelvic and perineal pain: Secondary | ICD-10-CM | POA: Diagnosis not present

## 2022-08-07 LAB — URINALYSIS, ROUTINE W REFLEX MICROSCOPIC
Bilirubin Urine: NEGATIVE
Glucose, UA: NEGATIVE mg/dL
Hgb urine dipstick: NEGATIVE
Ketones, ur: NEGATIVE mg/dL
Leukocytes,Ua: NEGATIVE
Nitrite: NEGATIVE
Protein, ur: NEGATIVE mg/dL
Specific Gravity, Urine: 1.015 (ref 1.005–1.030)
pH: 5 (ref 5.0–8.0)

## 2022-08-07 LAB — BASIC METABOLIC PANEL
Anion gap: 10 (ref 5–15)
BUN: 18 mg/dL (ref 6–20)
CO2: 27 mmol/L (ref 22–32)
Calcium: 9.1 mg/dL (ref 8.9–10.3)
Chloride: 103 mmol/L (ref 98–111)
Creatinine, Ser: 0.77 mg/dL (ref 0.44–1.00)
GFR, Estimated: >60 mL/min (ref >60)
Glucose, Bld: 118 mg/dL — ABNORMAL HIGH (ref 70–99)
Potassium: 3.3 mmol/L — ABNORMAL LOW (ref 3.5–5.1)
Sodium: 140 mmol/L (ref 135–145)

## 2022-08-07 LAB — CBC
HCT: 41.5 % (ref 36.0–46.0)
Hemoglobin: 14.1 g/dL (ref 12.0–15.0)
MCH: 30.8 pg (ref 26.0–34.0)
MCHC: 34 g/dL (ref 30.0–36.0)
MCV: 90.6 fL (ref 80.0–100.0)
Platelets: 332 10*3/uL (ref 150–400)
RBC: 4.58 MIL/uL (ref 3.87–5.11)
RDW: 13.9 % (ref 11.5–15.5)
WBC: 12.3 10*3/uL — ABNORMAL HIGH (ref 4.0–10.5)
nRBC: 0 % (ref 0.0–0.2)

## 2022-08-07 NOTE — ED Provider Notes (Signed)
[facility] Provider Note    Event Date/Time   First MD Initiated Contact with Patient 08/07/22 [phone]     (approximate)  History   Chief Complaint: Fall and Back Pain  HPI  Brianna Harris is a 62 y.o. female with a past medical history of diabetes, gastric reflux, hypertension, hyperlipidemia who is deaf presents to the emergency department after a fall.  According to the patient around 2 PM yesterday she tripped falling backwards landing on her buttocks and hit the back of her head on the grass.  Patient states she is having some pain and stiffness in her lower back and pelvis.  No headache.  No nausea or vomiting.  No LOC.  Patient does believe she takes a blood thinner but she is not sure which 1.  Physical Exam   Triage Vital Signs: ED Triage Vitals  Enc Vitals Group     BP 08/07/22 0109 (!) 156/77     Pulse Rate 08/07/22 0109 83     Resp 08/07/22 0109 18     Temp 08/07/22 0109 98.3 F (36.8 C)     Temp Source 08/07/22 0109 Oral     SpO2 08/07/22 0109 97 %     Weight --      Height --      Head Circumference --      Peak Flow --      Pain Score 08/07/22 0108 8     Pain Loc --      Pain Edu? --      Excl. in GC? --     Most recent vital signs: Vitals:   08/07/22 0109  BP: (!) 156/77  Pulse: 83  Resp: 18  Temp: 98.3 F (36.8 C)  SpO2: 97%    General: Awake, no distress.  CV:  Good peripheral perfusion.  Regular rate and rhythm  Resp:  Normal effort.  Equal breath sounds bilaterally.  Abd:  No distention.  Soft, nontender.  No rebound or guarding. Other:  No CT or L-spine tenderness to palpation.  No step-offs or deformities.   ED Results / Procedures / Treatments   RADIOLOGY  I have reviewed and interpreted the CT head images.  No bleed seen on my evaluation. Radiology is read the CT scan is negative. Pelvis x-ray and lumbar x-ray showed no acute fracture or abnormality.   MEDICATIONS ORDERED IN ED: Medications - No data to  display   IMPRESSION / MDM / ASSESSMENT AND PLAN / ED COURSE  I reviewed the triage vital signs and the nursing notes.  Patient's presentation is most consistent with acute presentation with potential threat to life or bodily function.  Patient presents emergency department for mechanical fall.  Patient's chemistry shows no concerning findings, CBC reassuring as well.  Given the patient's reported head injury we will obtain a CT scan of the head as a precaution.  We will obtain a lumbar and pelvis x-ray as well.  Overall patient appears well great range of motion in all extremities no obvious signs of trauma identified on exam.  Reassuring vital signs.  No acute finding on the patient's x-rays or CT imaging.  Urinalysis has resulted normal as well.  We will discharge patient from the emergency department with PCP follow-up discussed Tylenol or ibuprofen as needed for discomfort.  Patient agreeable.  FINAL CLINICAL IMPRESSION(S) / ED DIAGNOSES   Fall Head injury    Note:  This document was prepared using Conservation officer, historic buildings  and may include unintentional dictation errors.   Minna Antis, MD 08/07/22 [phone]

## 2022-08-07 NOTE — ED Notes (Signed)
Pt ambulated to the bathroom with assistance, family at bedside. Pt given snack and water.

## 2022-08-07 NOTE — ED Triage Notes (Addendum)
Pt presents to ER with c/o left lower back pain that started after pt had a fall on Sunday 4/14.  Pt endorses some trouble walking since the fall.  Pt also c/o less urinary output since pain started, with the feeling like she is not fully emptying her bladder.  Pt states pain does not radiate anywhere.  States pain is worse when bending over, and making certain movements.  Pt is otherwise A&O x4 and in NAD in triage.

## 2022-08-10 ENCOUNTER — Telehealth: Payer: Self-pay

## 2022-08-10 NOTE — Telephone Encounter (Signed)
        Patient  visited [place] on 4/16   Telephone encounter attempt :   2nd  A HIPAA compliant voice message was left requesting a return call.  Instructed patient to call back    Lenard Forth [facility] Guide, [facility] [phone] [address] [place], [place], [place] [address] Phone: [phone] Email: [email or web address]

## 2022-08-10 NOTE — Telephone Encounter (Signed)
        Patient  visited [place] on 4/16   Telephone encounter attempt :  1st  A HIPAA compliant voice message was left requesting a return call.  Instructed patient to call back    Lenard Forth [facility] Guide, [facility] [phone] [address] [place], [place], [place] [address] Phone: [phone] Email: [email or web address]

## 2022-08-13 DIAGNOSIS — R791 Abnormal coagulation profile: Secondary | ICD-10-CM | POA: Diagnosis not present

## 2022-08-15 ENCOUNTER — Other Ambulatory Visit: Payer: Self-pay | Admitting: Internal Medicine

## 2022-08-15 DIAGNOSIS — Z1231 Encounter for screening mammogram for malignant neoplasm of breast: Secondary | ICD-10-CM

## 2022-08-20 DIAGNOSIS — R791 Abnormal coagulation profile: Secondary | ICD-10-CM | POA: Diagnosis not present

## 2022-08-27 DIAGNOSIS — R791 Abnormal coagulation profile: Secondary | ICD-10-CM | POA: Diagnosis not present

## 2022-08-30 DIAGNOSIS — R399 Unspecified symptoms and signs involving the genitourinary system: Secondary | ICD-10-CM | POA: Diagnosis not present

## 2022-09-04 DIAGNOSIS — R791 Abnormal coagulation profile: Secondary | ICD-10-CM | POA: Diagnosis not present

## 2022-09-04 DIAGNOSIS — E1165 Type 2 diabetes mellitus with hyperglycemia: Secondary | ICD-10-CM | POA: Diagnosis not present

## 2022-09-11 DIAGNOSIS — R791 Abnormal coagulation profile: Secondary | ICD-10-CM | POA: Diagnosis not present

## 2022-09-12 DIAGNOSIS — F32A Depression, unspecified: Secondary | ICD-10-CM | POA: Diagnosis not present

## 2022-09-12 DIAGNOSIS — H9193 Unspecified hearing loss, bilateral: Secondary | ICD-10-CM | POA: Diagnosis not present

## 2022-09-12 DIAGNOSIS — E1165 Type 2 diabetes mellitus with hyperglycemia: Secondary | ICD-10-CM | POA: Diagnosis not present

## 2022-09-12 DIAGNOSIS — K219 Gastro-esophageal reflux disease without esophagitis: Secondary | ICD-10-CM | POA: Diagnosis not present

## 2022-09-12 DIAGNOSIS — E7801 Familial hypercholesterolemia: Secondary | ICD-10-CM | POA: Diagnosis not present

## 2022-09-12 DIAGNOSIS — I10 Essential (primary) hypertension: Secondary | ICD-10-CM | POA: Diagnosis not present

## 2022-09-12 DIAGNOSIS — R300 Dysuria: Secondary | ICD-10-CM | POA: Diagnosis not present

## 2022-09-12 DIAGNOSIS — J4520 Mild intermittent asthma, uncomplicated: Secondary | ICD-10-CM | POA: Diagnosis not present

## 2022-09-12 DIAGNOSIS — Z0001 Encounter for general adult medical examination with abnormal findings: Secondary | ICD-10-CM | POA: Diagnosis not present

## 2022-09-14 DIAGNOSIS — H02826 Cysts of left eye, unspecified eyelid: Secondary | ICD-10-CM | POA: Diagnosis not present

## 2022-09-18 ENCOUNTER — Ambulatory Visit
Admission: RE | Admit: 2022-09-18 | Discharge: 2022-09-18 | Disposition: A | Discharge status: Home / Self Care | Payer: PPO | Source: Ambulatory Visit | Attending: Internal Medicine | Admitting: Internal Medicine

## 2022-09-18 DIAGNOSIS — Z1231 Encounter for screening mammogram for malignant neoplasm of breast: Secondary | ICD-10-CM | POA: Insufficient documentation

## 2022-09-18 DIAGNOSIS — R791 Abnormal coagulation profile: Secondary | ICD-10-CM | POA: Diagnosis not present

## 2022-09-19 DIAGNOSIS — E1165 Type 2 diabetes mellitus with hyperglycemia: Secondary | ICD-10-CM | POA: Diagnosis not present

## 2022-09-19 DIAGNOSIS — E119 Type 2 diabetes mellitus without complications: Secondary | ICD-10-CM | POA: Diagnosis not present

## 2022-09-19 DIAGNOSIS — H9193 Unspecified hearing loss, bilateral: Secondary | ICD-10-CM | POA: Diagnosis not present

## 2022-09-27 DIAGNOSIS — Z87898 Personal history of other specified conditions: Secondary | ICD-10-CM | POA: Diagnosis not present

## 2022-09-27 DIAGNOSIS — R202 Paresthesia of skin: Secondary | ICD-10-CM | POA: Diagnosis not present

## 2022-09-27 DIAGNOSIS — R2689 Other abnormalities of gait and mobility: Secondary | ICD-10-CM | POA: Diagnosis not present

## 2022-09-27 DIAGNOSIS — Z79899 Other long term (current) drug therapy: Secondary | ICD-10-CM | POA: Diagnosis not present

## 2022-09-27 DIAGNOSIS — R42 Dizziness and giddiness: Secondary | ICD-10-CM | POA: Diagnosis not present

## 2022-09-27 DIAGNOSIS — R791 Abnormal coagulation profile: Secondary | ICD-10-CM | POA: Diagnosis not present

## 2022-10-01 DIAGNOSIS — R2689 Other abnormalities of gait and mobility: Secondary | ICD-10-CM | POA: Diagnosis not present

## 2022-10-01 DIAGNOSIS — Z79899 Other long term (current) drug therapy: Secondary | ICD-10-CM | POA: Diagnosis not present

## 2022-10-01 DIAGNOSIS — R42 Dizziness and giddiness: Secondary | ICD-10-CM | POA: Diagnosis not present

## 2022-10-01 DIAGNOSIS — R202 Paresthesia of skin: Secondary | ICD-10-CM | POA: Diagnosis not present

## 2022-10-02 DIAGNOSIS — R6889 Other general symptoms and signs: Secondary | ICD-10-CM | POA: Diagnosis not present

## 2022-10-05 ENCOUNTER — Emergency Department: Payer: PPO

## 2022-10-05 ENCOUNTER — Inpatient Hospital Stay
Admission: EM | Admit: 2022-10-05 | Discharge: 2022-10-08 | DRG: 194 | Disposition: A | Discharge status: Home Health | Payer: PPO | Attending: Osteopathic Medicine | Admitting: Osteopathic Medicine

## 2022-10-05 ENCOUNTER — Other Ambulatory Visit: Payer: Self-pay

## 2022-10-05 DIAGNOSIS — S0990XA Unspecified injury of head, initial encounter: Secondary | ICD-10-CM | POA: Diagnosis not present

## 2022-10-05 DIAGNOSIS — R509 Fever, unspecified: Secondary | ICD-10-CM | POA: Diagnosis not present

## 2022-10-05 DIAGNOSIS — E876 Hypokalemia: Secondary | ICD-10-CM | POA: Diagnosis present

## 2022-10-05 DIAGNOSIS — Z952 Presence of prosthetic heart valve: Secondary | ICD-10-CM

## 2022-10-05 DIAGNOSIS — N179 Acute kidney failure, unspecified: Secondary | ICD-10-CM | POA: Diagnosis not present

## 2022-10-05 DIAGNOSIS — K219 Gastro-esophageal reflux disease without esophagitis: Secondary | ICD-10-CM | POA: Diagnosis not present

## 2022-10-05 DIAGNOSIS — Z833 Family history of diabetes mellitus: Secondary | ICD-10-CM | POA: Diagnosis not present

## 2022-10-05 DIAGNOSIS — Z79899 Other long term (current) drug therapy: Secondary | ICD-10-CM

## 2022-10-05 DIAGNOSIS — R791 Abnormal coagulation profile: Secondary | ICD-10-CM | POA: Diagnosis present

## 2022-10-05 DIAGNOSIS — Z7901 Long term (current) use of anticoagulants: Secondary | ICD-10-CM

## 2022-10-05 DIAGNOSIS — Z961 Presence of intraocular lens: Secondary | ICD-10-CM | POA: Diagnosis not present

## 2022-10-05 DIAGNOSIS — Z90710 Acquired absence of both cervix and uterus: Secondary | ICD-10-CM

## 2022-10-05 DIAGNOSIS — Z8249 Family history of ischemic heart disease and other diseases of the circulatory system: Secondary | ICD-10-CM | POA: Diagnosis not present

## 2022-10-05 DIAGNOSIS — R296 Repeated falls: Secondary | ICD-10-CM

## 2022-10-05 DIAGNOSIS — F79 Unspecified intellectual disabilities: Secondary | ICD-10-CM | POA: Diagnosis present

## 2022-10-05 DIAGNOSIS — Z603 Acculturation difficulty: Secondary | ICD-10-CM | POA: Diagnosis present

## 2022-10-05 DIAGNOSIS — E785 Hyperlipidemia, unspecified: Secondary | ICD-10-CM | POA: Diagnosis not present

## 2022-10-05 DIAGNOSIS — J45909 Unspecified asthma, uncomplicated: Secondary | ICD-10-CM

## 2022-10-05 DIAGNOSIS — R001 Bradycardia, unspecified: Secondary | ICD-10-CM | POA: Diagnosis not present

## 2022-10-05 DIAGNOSIS — Z8673 Personal history of transient ischemic attack (TIA), and cerebral infarction without residual deficits: Secondary | ICD-10-CM | POA: Diagnosis not present

## 2022-10-05 DIAGNOSIS — H9193 Unspecified hearing loss, bilateral: Secondary | ICD-10-CM | POA: Diagnosis not present

## 2022-10-05 DIAGNOSIS — Y9301 Activity, walking, marching and hiking: Secondary | ICD-10-CM | POA: Diagnosis present

## 2022-10-05 DIAGNOSIS — R059 Cough, unspecified: Secondary | ICD-10-CM | POA: Diagnosis not present

## 2022-10-05 DIAGNOSIS — Z950 Presence of cardiac pacemaker: Secondary | ICD-10-CM

## 2022-10-05 DIAGNOSIS — E119 Type 2 diabetes mellitus without complications: Secondary | ICD-10-CM | POA: Diagnosis not present

## 2022-10-05 DIAGNOSIS — Z87891 Personal history of nicotine dependence: Secondary | ICD-10-CM | POA: Diagnosis not present

## 2022-10-05 DIAGNOSIS — Z7984 Long term (current) use of oral hypoglycemic drugs: Secondary | ICD-10-CM

## 2022-10-05 DIAGNOSIS — R569 Unspecified convulsions: Secondary | ICD-10-CM | POA: Diagnosis not present

## 2022-10-05 DIAGNOSIS — I10 Essential (primary) hypertension: Secondary | ICD-10-CM | POA: Diagnosis not present

## 2022-10-05 DIAGNOSIS — H913 Deaf nonspeaking, not elsewhere classified: Secondary | ICD-10-CM | POA: Diagnosis present

## 2022-10-05 DIAGNOSIS — W010XXA Fall on same level from slipping, tripping and stumbling without subsequent striking against object, initial encounter: Secondary | ICD-10-CM | POA: Diagnosis present

## 2022-10-05 DIAGNOSIS — R531 Weakness: Secondary | ICD-10-CM

## 2022-10-05 DIAGNOSIS — M47816 Spondylosis without myelopathy or radiculopathy, lumbar region: Secondary | ICD-10-CM | POA: Diagnosis not present

## 2022-10-05 DIAGNOSIS — W19XXXA Unspecified fall, initial encounter: Secondary | ICD-10-CM

## 2022-10-05 DIAGNOSIS — Z7985 Long-term (current) use of injectable non-insulin antidiabetic drugs: Secondary | ICD-10-CM

## 2022-10-05 DIAGNOSIS — M542 Cervicalgia: Secondary | ICD-10-CM | POA: Diagnosis not present

## 2022-10-05 DIAGNOSIS — J189 Pneumonia, unspecified organism: Secondary | ICD-10-CM | POA: Diagnosis not present

## 2022-10-05 DIAGNOSIS — M47812 Spondylosis without myelopathy or radiculopathy, cervical region: Secondary | ICD-10-CM | POA: Diagnosis not present

## 2022-10-05 DIAGNOSIS — M545 Low back pain, unspecified: Secondary | ICD-10-CM | POA: Diagnosis not present

## 2022-10-05 DIAGNOSIS — R339 Retention of urine, unspecified: Secondary | ICD-10-CM | POA: Diagnosis not present

## 2022-10-05 DIAGNOSIS — H9190 Unspecified hearing loss, unspecified ear: Secondary | ICD-10-CM

## 2022-10-05 DIAGNOSIS — Z043 Encounter for examination and observation following other accident: Secondary | ICD-10-CM | POA: Diagnosis not present

## 2022-10-05 DIAGNOSIS — Z9842 Cataract extraction status, left eye: Secondary | ICD-10-CM

## 2022-10-05 DIAGNOSIS — S3993XA Unspecified injury of pelvis, initial encounter: Secondary | ICD-10-CM | POA: Diagnosis not present

## 2022-10-05 LAB — BASIC METABOLIC PANEL
Anion gap: 13 (ref 5–15)
BUN: 37 mg/dL — ABNORMAL HIGH (ref 6–20)
CO2: 25 mmol/L (ref 22–32)
Calcium: 8.9 mg/dL (ref 8.9–10.3)
Chloride: 98 mmol/L (ref 98–111)
Creatinine, Ser: 1.78 mg/dL — ABNORMAL HIGH (ref 0.44–1.00)
GFR, Estimated: 32 mL/min — ABNORMAL LOW (ref >60)
Glucose, Bld: 123 mg/dL — ABNORMAL HIGH (ref 70–99)
Potassium: 3.5 mmol/L (ref 3.5–5.1)
Sodium: 136 mmol/L (ref 135–145)

## 2022-10-05 LAB — CBC WITH DIFFERENTIAL/PLATELET
Abs Immature Granulocytes: 0.02 10*3/uL (ref 0.00–0.07)
Basophils Absolute: 0 10*3/uL (ref 0.0–0.1)
Basophils Relative: 0 %
Eosinophils Absolute: 0.2 10*3/uL (ref 0.0–0.5)
Eosinophils Relative: 2 %
HCT: 44 % (ref 36.0–46.0)
Hemoglobin: 14.7 g/dL (ref 12.0–15.0)
Immature Granulocytes: 0 %
Lymphocytes Relative: 31 %
Lymphs Abs: 3.6 10*3/uL (ref 0.7–4.0)
MCH: 30.4 pg (ref 26.0–34.0)
MCHC: 33.4 g/dL (ref 30.0–36.0)
MCV: 91.1 fL (ref 80.0–100.0)
Monocytes Absolute: 1.1 10*3/uL — ABNORMAL HIGH (ref 0.1–1.0)
Monocytes Relative: 9 %
Neutro Abs: 6.7 10*3/uL (ref 1.7–7.7)
Neutrophils Relative %: 58 %
Platelets: 339 10*3/uL (ref 150–400)
RBC: 4.83 MIL/uL (ref 3.87–5.11)
RDW: 13.1 % (ref 11.5–15.5)
WBC: 11.7 10*3/uL — ABNORMAL HIGH (ref 4.0–10.5)
nRBC: 0 % (ref 0.0–0.2)

## 2022-10-05 LAB — HEPATIC FUNCTION PANEL
ALT: 23 U/L (ref 0–44)
AST: 30 U/L (ref 15–41)
Albumin: 4.3 g/dL (ref 3.5–5.0)
Alkaline Phosphatase: 52 U/L (ref 38–126)
Bilirubin, Direct: 0.2 mg/dL (ref 0.0–0.2)
Indirect Bilirubin: 1 mg/dL — ABNORMAL HIGH (ref 0.3–0.9)
Total Bilirubin: 1.2 mg/dL (ref 0.3–1.2)
Total Protein: 7.2 g/dL (ref 6.5–8.1)

## 2022-10-05 LAB — APTT: aPTT: 39 seconds — ABNORMAL HIGH (ref 24–36)

## 2022-10-05 LAB — PROTIME-INR
INR: 1.7 — ABNORMAL HIGH (ref 0.8–1.2)
Prothrombin Time: 20.3 seconds — ABNORMAL HIGH (ref 11.4–15.2)

## 2022-10-05 LAB — TROPONIN I (HIGH SENSITIVITY): Troponin I (High Sensitivity): 21 ng/L — ABNORMAL HIGH (ref <18)

## 2022-10-05 MED ORDER — HEPARIN BOLUS VIA INFUSION
3400.0000 [IU] | Freq: Once | INTRAVENOUS | Status: AC
  Administered 2022-10-06: 3400 [IU] via INTRAVENOUS
  Filled 2022-10-05: qty 3400

## 2022-10-05 MED ORDER — INSULIN ASPART 100 UNIT/ML IJ SOLN
0.0000 [IU] | Freq: Every day | INTRAMUSCULAR | Status: DC
  Administered 2022-10-06: 2 [IU] via SUBCUTANEOUS
  Filled 2022-10-05: qty 1

## 2022-10-05 MED ORDER — SODIUM CHLORIDE 0.9 % IV SOLN
500.0000 mg | INTRAVENOUS | Status: AC
  Administered 2022-10-06 – 2022-10-07 (×2): 500 mg via INTRAVENOUS
  Filled 2022-10-05 (×2): qty 5

## 2022-10-05 MED ORDER — SODIUM CHLORIDE 0.9 % IV BOLUS
1000.0000 mL | Freq: Once | INTRAVENOUS | Status: AC
  Administered 2022-10-05: 1000 mL via INTRAVENOUS

## 2022-10-05 MED ORDER — INSULIN ASPART 100 UNIT/ML IJ SOLN
0.0000 [IU] | Freq: Three times a day (TID) | INTRAMUSCULAR | Status: DC
  Administered 2022-10-07: 2 [IU] via SUBCUTANEOUS
  Filled 2022-10-05: qty 1

## 2022-10-05 MED ORDER — LAMOTRIGINE 100 MG PO TABS
100.0000 mg | ORAL_TABLET | Freq: Two times a day (BID) | ORAL | Status: DC
  Administered 2022-10-06 – 2022-10-08 (×6): 100 mg via ORAL
  Filled 2022-10-05 (×6): qty 1

## 2022-10-05 MED ORDER — SODIUM CHLORIDE 0.9 % IV SOLN
500.0000 mg | Freq: Once | INTRAVENOUS | Status: AC
  Administered 2022-10-05: 500 mg via INTRAVENOUS
  Filled 2022-10-05: qty 5

## 2022-10-05 MED ORDER — DM-GUAIFENESIN ER 30-600 MG PO TB12
1.0000 | ORAL_TABLET | Freq: Two times a day (BID) | ORAL | Status: DC
  Administered 2022-10-06 – 2022-10-08 (×6): 1 via ORAL
  Filled 2022-10-05 (×7): qty 1

## 2022-10-05 MED ORDER — SODIUM CHLORIDE 0.9 % IV SOLN
2.0000 g | Freq: Once | INTRAVENOUS | Status: AC
  Administered 2022-10-05: 2 g via INTRAVENOUS
  Filled 2022-10-05: qty 20

## 2022-10-05 MED ORDER — SODIUM CHLORIDE 0.9 % IV SOLN
1.0000 g | INTRAVENOUS | Status: DC
  Administered 2022-10-06 – 2022-10-08 (×3): 1 g via INTRAVENOUS
  Filled 2022-10-05 (×3): qty 10

## 2022-10-05 MED ORDER — HEPARIN (PORCINE) 25000 UT/250ML-% IV SOLN
900.0000 [IU]/h | INTRAVENOUS | Status: DC
  Administered 2022-10-06: 750 [IU]/h via INTRAVENOUS
  Filled 2022-10-05 (×2): qty 250

## 2022-10-05 NOTE — Assessment & Plan Note (Signed)
No associated hypoxia.  Patient has been coughing for a week associated with poor appetite 1 episode of vomiting.  Found on CAT scan done for AKI which shows right lower lobe pneumonia.  Crackles are still heard in this region.  Treat with ceftriaxone and azithromycin.

## 2022-10-05 NOTE — Assessment & Plan Note (Addendum)
As per patient's mother, INR target is 2.5-3.5.  This is corroborated by my review of cardiology telephone note on September 27, 2022.  Patient has had a couple of strokes in her lifetime.  Current INR is 1.7 we will treat with heparin bridge.  Warfarin as per pharmacy protocol.  We will try to look for further documentation as to advise if patient needs bridging and what the exact target is

## 2022-10-05 NOTE — Assessment & Plan Note (Signed)
Several unwitnessed falls for the last 1 week.  No history of focal weakness tremors loss of consciousness.  In the setting of pneumonia most consistent with deconditioning due to not eating and drinking.  Will request physical therapy evaluation in the morning

## 2022-10-05 NOTE — Assessment & Plan Note (Signed)
This is chronic/remote problem.  Patient apparently has a pacemaker from [facility] as per mother

## 2022-10-05 NOTE — Progress Notes (Signed)
ANTICOAGULATION CONSULT NOTE  Pharmacy Consult for Warfarin with Heparin Bridge Indication: Mechanical Valve  No Known Allergies  Patient Measurements: Height: 5\' 2"  (157.5 cm) Weight: 56 kg (123 lb 7.3 oz) IBW/kg (Calculated) : 50.1 Heparin Dosing Weight: 56 kg  Vital Signs: Temp: 98.4 F (36.9 C) (06/14 2245) Temp Source: Oral (06/14 2245) BP: 95/60 (06/14 2145) Pulse Rate: 88 (06/14 2145)  Labs: Recent Labs    10/05/22 1938 10/05/22 2141 10/05/22 2231  HGB 14.7  --   --   HCT 44.0  --   --   PLT 339  --   --   APTT  --  39*  --   LABPROT  --  20.3*  --   INR  --  1.7*  --   CREATININE 1.78*  --   --   TROPONINIHS  --   --  21*    Estimated Creatinine Clearance: 26.9 mL/min (A) (by C-G formula based on SCr of 1.78 mg/dL (H)).   Medical History: Past Medical History:  Diagnosis Date   Asthma    Deafness    Depression    Diabetes mellitus without complication (HCC)    Family history of adverse reaction to anesthesia    Mother - PONV   GERD (gastroesophageal reflux disease)    Heart abnormality    per mother pt has bad heart murmor- sees Dr Lady Gary    Hyperlipidemia    Hypertension    Mechanical heart valve present    PONV (postoperative nausea and vomiting)    Presence of permanent cardiac pacemaker    Staring episodes     Medications:  Warfarin - med rec pending, last dose & time unknown, INR at admission 1.7.  Assessment: Pt is 62 yo female w/ hx of mechanical valve on warfarin.  Goal of Therapy:  INR 2.5 -3.5 Heparin level 0.3-0.7 units/ml Monitor platelets by anticoagulation protocol: Yes   Plan:  Bolus 3400 units x 1 Start heparin infusion at 750 units/hr Check HL/INR with AM labs CBC daily while on heparin  Otelia Sergeant, PharmD, [facility] 10/05/2022 11:23 PM

## 2022-10-05 NOTE — H&P (Addendum)
History and Physical    Patient: Brianna Harris FAO:[id number] DOB: 10/11/1960 DOA: 10/05/2022 DOS: the patient was seen and examined on 10/05/2022 PCP: Gracelyn Nurse, MD  Patient coming from: Home  Chief Complaint:  Chief Complaint  Patient presents with   Fall    Multiple today   HPI: Brianna Harris is a 62 y.o. 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  Patient was in her usual state of health till about a week ago when patient had a new onset of cough which was dry.  There is no associated fever chest pain shortness of breath that is reported no diarrhea that is confirmed.  Patient has had 1 episode of vomiting in the last 5 days.  Patient was started as outpatient on azithromycin for her cough.  Unfortunately patient has had very poor appetite and has not been eating and drinking much.  Was found to have several what sounds like mechanical falls at home due to generalized weakness of patient.  There is no report of patient having focal weakness loss of consciousness or tremors to suggest seizures.  Unfortunately the only weakness of the patient's fall is another lady who also seems to have mental retardation/deafness and mute status since birth.  Patient is brought into the ER today because of recurrent falls.  Patient does not report any trauma from her falls.  Patient describes several episodes of tripping, falling in the dark or walking backwards and then tripping over furniture.  Patient does not report any focal weakness loss of consciousness or tremor.  ER course is notable for finding of AKI, during further workup, right lower lobe pneumonia is discovered. Review of Systems: As mentioned in the history of present illness. All other systems reviewed and are negative. Past Medical History:  Diagnosis Date   Asthma    Deafness    Depression    Diabetes mellitus without complication (HCC)     Family history of adverse reaction to anesthesia    Mother - PONV   GERD (gastroesophageal reflux disease)    Heart abnormality    per mother pt has bad heart murmor- sees Dr Lady Gary    Hyperlipidemia    Hypertension    Mechanical heart valve present    PONV (postoperative nausea and vomiting)    Presence of permanent cardiac pacemaker    Staring episodes    Past Surgical History:  Procedure Laterality Date   ABDOMINAL HYSTERECTOMY  2000   per mother [facility] thought pt cancer and did hyster ,cervix and ovaries   CARDIAC VALVE REPLACEMENT     CATARACT EXTRACTION W/PHACO Left 02/14/2021   Procedure: CATARACT EXTRACTION PHACO AND INTRAOCULAR LENS PLACEMENT (IOC) Left DIABETIC;  Surgeon: Galen Manila, MD;  Location: [facility];  Service: Ophthalmology;  Laterality: Left;  leave arrival at 9 sign language interpreter has been requested  kp 6.12 00:39.2   CHOLECYSTECTOMY     COLONOSCOPY WITH PROPOFOL N/A 08/17/2016   Procedure: COLONOSCOPY WITH PROPOFOL;  Surgeon: Scot Jun, MD;  Location: [facility] ENDOSCOPY;  Service: Endoscopy;  Laterality: N/A;   COLONOSCOPY WITH PROPOFOL N/A 02/02/2020   Procedure: COLONOSCOPY WITH PROPOFOL;  Surgeon: Toney Reil, MD;  Location: [facility] ENDOSCOPY;  Service: Gastroenterology;  Laterality: N/A;   ESOPHAGOGASTRODUODENOSCOPY (EGD) WITH PROPOFOL N/A 08/17/2016   Procedure: ESOPHAGOGASTRODUODENOSCOPY (EGD) WITH PROPOFOL;  Surgeon: Scot Jun, MD;  Location: [facility] ENDOSCOPY;  Service: Endoscopy;  Laterality:  N/A;   ESOPHAGOGASTRODUODENOSCOPY (EGD) WITH PROPOFOL  02/02/2020   Procedure: ESOPHAGOGASTRODUODENOSCOPY (EGD) WITH PROPOFOL;  Surgeon: Toney Reil, MD;  Location: [facility] ENDOSCOPY;  Service: Gastroenterology;;   PACEMAKER IMPLANT  01/2018   Social History:  reports that she quit smoking about 4 years ago. Her smoking use included cigarettes. She has a 58.50 pack-year smoking history. She has never used smokeless tobacco. She  reports that she does not currently use alcohol. She reports current drug use. Drug: Marijuana.  No Known Allergies  Family History  Problem Relation Age of Onset   Diabetes Maternal Uncle    Heart attack Father    Kidney cancer Paternal Uncle    Breast cancer Neg Hx    Bladder Cancer Neg Hx     Prior to Admission medications   Medication Sig Start Date End Date Taking? Authorizing Provider  acetaminophen (TYLENOL) 500 MG tablet Take 1,000 mg by mouth every 6 (six) hours as needed for moderate pain.    [provider]  amitriptyline (ELAVIL) 25 MG tablet Take 10 mg by mouth at bedtime. 05/04/19   [provider]  atorvastatin (LIPITOR) 40 MG tablet Take 40 mg by mouth at bedtime. 10/19/19   [provider]  azithromycin (ZITHROMAX) 250 MG tablet Take 1 tablet (250 mg total) by mouth daily. 03/21/22   Minna Antis, MD  Blood Glucose Monitoring Suppl (ONE TOUCH ULTRA 2) w/Device KIT See admin instructions. 10/02/19   [provider]  Continuous Blood Gluc Receiver (FREESTYLE LIBRE 2 READER) DEVI USE AS DIRECTED TO MONITOR BLOD GLUCOSE 01/19/22   [provider]  Continuous Blood Gluc Sensor (FREESTYLE LIBRE 2 SENSOR) MISC USE EVERY 14 DAYS FOR GLUCOSE MONITORING 01/19/22   [provider]  hydrochlorothiazide (HYDRODIURIL) 12.5 MG tablet Take 12.5 mg by mouth daily. 02/06/22   [provider]  lamoTRIgine (LAMICTAL) 100 MG tablet Take 100 mg by mouth 2 (two) times daily. 01/05/22   [provider]  Lancets [facility] [place] PLUS [place]) MISC  10/05/19   [provider]  metFORMIN (GLUCOPHAGE-XR) 500 MG 24 hr tablet Take 1,000 mg by mouth 2 (two) times daily. 09/16/20   [provider]  metoprolol tartrate (LOPRESSOR) 25 MG tablet Take 25 mg by mouth 2 (two) times daily.  05/04/19   [provider]  nystatin cream (MYCOSTATIN) Apply 1 Application topically 2 (two) times daily as needed. 12/15/21    [provider]  omeprazole (PRILOSEC) 40 MG capsule Take 40 mg by mouth daily.    [provider]  ondansetron (ZOFRAN-ODT) 4 MG disintegrating tablet Take 1 tablet (4 mg total) by mouth every 8 (eight) hours as needed for nausea or vomiting. 03/21/22   Minna Antis, MD  ONE TOUCH ULTRA TEST test strip  05/08/16   [provider]  OneTouch Delica Lancets 33G MISC Use once daily to check blood sugars dx e11.9 10/05/19   [provider]  predniSONE (DELTASONE) 10 MG tablet Take 1 tablet (10 mg total) by mouth daily. Day 1-3: take 4 tablets PO daily Day 4-6: take 3 tablets PO daily Day 7-9: take 2 tablets PO daily Day 10-12: take 1 tablet PO daily 03/21/22   Minna Antis, MD  TRULICITY 0.75 MG/0.5ML SOPN SMARTSIG:0.5 Milliliter(s) SUB-Q Once a Week    [provider]  TRULICITY 1.5 MG/0.5ML SOPN Inject 1.5 mg into the skin once a week. 01/13/22   [provider]  warfarin (COUMADIN) 1 MG tablet Take 1 mg by mouth  daily.    [provider]  warfarin (COUMADIN) 3 MG tablet Take 3 mg by mouth daily.    [provider]    Physical Exam: Vitals:   10/05/22 1852 10/05/22 2115 10/05/22 2145 10/05/22 2245  BP: 125/74  95/60   Pulse: 87 91 88   Resp: 20  18   Temp: 99.1 F (37.3 C)   98.4 F (36.9 C)  TempSrc: Oral   Oral  SpO2: 97% 96% 96%   Weight:      Height:       General: Patient appears to be in no distress is using American sign language interpreter via telemetry for communication.  Mother is at the bedside who is apparently the healthcare proxy.  Who seems to clarify several aspects of history obtained from the patient as patient is not giving 100% reliable history. Respiratory exam: Bilateral intravesicular.  Right basilar crackles posteriorly Cardiovascular exam metallic click is heard otherwise normal heart sounds Abdomen soft nontender Extremities warm withou edema, moving to directions. Some direct  tenderness C6-C7.  Movements are intact  Data Reviewed:  Labs on Admission:  Results for orders placed or performed during the hospital encounter of 10/05/22 (from the past 24 hour(s))  Basic metabolic panel     Status: Abnormal   Collection Time: 10/05/22  7:38 PM  Result Value Ref Range   Sodium 136 135 - 145 mmol/L   Potassium 3.5 3.5 - 5.1 mmol/L   Chloride 98 98 - 111 mmol/L   CO2 25 22 - 32 mmol/L   Glucose, Bld 123 (H) 70 - 99 mg/dL   BUN 37 (H) 6 - 20 mg/dL   Creatinine, Ser 1.61 (H) 0.44 - 1.00 mg/dL   Calcium 8.9 8.9 - 09.6 mg/dL   GFR, Estimated 32 (L) >60 mL/min   Anion gap 13 5 - 15  CBC with Differential     Status: Abnormal   Collection Time: 10/05/22  7:38 PM  Result Value Ref Range   WBC 11.7 (H) 4.0 - 10.5 K/uL   RBC 4.83 3.87 - 5.11 MIL/uL   Hemoglobin 14.7 12.0 - 15.0 g/dL   HCT 04.5 40.9 - 81.1 %   MCV 91.1 80.0 - 100.0 fL   MCH 30.4 26.0 - 34.0 pg   MCHC 33.4 30.0 - 36.0 g/dL   RDW 91.4 78.2 - 95.6 %   Platelets 339 150 - 400 K/uL   nRBC 0.0 0.0 - 0.2 %   Neutrophils Relative % 58 %   Neutro Abs 6.7 1.7 - 7.7 K/uL   Lymphocytes Relative 31 %   Lymphs Abs 3.6 0.7 - 4.0 K/uL   Monocytes Relative 9 %   Monocytes Absolute 1.1 (H) 0.1 - 1.0 K/uL   Eosinophils Relative 2 %   Eosinophils Absolute 0.2 0.0 - 0.5 K/uL   Basophils Relative 0 %   Basophils Absolute 0.0 0.0 - 0.1 K/uL   Immature Granulocytes 0 %   Abs Immature Granulocytes 0.02 0.00 - 0.07 K/uL  Hepatic function panel     Status: Abnormal   Collection Time: 10/05/22  7:38 PM  Result Value Ref Range   Total Protein 7.2 6.5 - 8.1 g/dL   Albumin 4.3 3.5 - 5.0 g/dL   AST 30 15 - 41 U/L   ALT 23 0 - 44 U/L   Alkaline Phosphatase 52 38 - 126 U/L   Total Bilirubin 1.2 0.3 - 1.2 mg/dL   Bilirubin, Direct 0.2 0.0 - 0.2 mg/dL  Indirect Bilirubin 1.0 (H) 0.3 - 0.9 mg/dL  Protime-INR     Status: Abnormal   Collection Time: 10/05/22  9:41 PM  Result Value Ref Range   Prothrombin Time 20.3 (H)  11.4 - 15.2 seconds   INR 1.7 (H) 0.8 - 1.2  APTT     Status: Abnormal   Collection Time: 10/05/22  9:41 PM  Result Value Ref Range   aPTT 39 (H) 24 - 36 seconds  Troponin I (High Sensitivity)     Status: Abnormal   Collection Time: 10/05/22 10:31 PM  Result Value Ref Range   Troponin I (High Sensitivity) 21 (H) <18 ng/L   Basic Metabolic Panel: Recent Labs  Lab 10/05/22 1938  NA 136  K 3.5  CL 98  CO2 25  GLUCOSE 123*  BUN 37*  CREATININE 1.78*  CALCIUM 8.9   Liver Function Tests: Recent Labs  Lab 10/05/22 1938  AST 30  ALT 23  ALKPHOS 52  BILITOT 1.2  PROT 7.2  ALBUMIN 4.3   No results for input(s): "LIPASE", "AMYLASE" in the last 168 hours. No results for input(s): "AMMONIA" in the last 168 hours. CBC: Recent Labs  Lab 10/05/22 1938  WBC 11.7*  NEUTROABS 6.7  HGB 14.7  HCT 44.0  MCV 91.1  PLT 339   Cardiac Enzymes: Recent Labs  Lab 10/05/22 2231  TROPONINIHS 21*    BNP (last 3 results) No results for input(s): "PROBNP" in the last 8760 hours. CBG: No results for input(s): "GLUCAP" in the last 168 hours.  Radiological Exams on Admission:  CT Renal Stone Study  Result Date: 10/05/2022 CLINICAL DATA:  Acute kidney injury.  Fall. EXAM: CT ABDOMEN AND PELVIS WITHOUT CONTRAST TECHNIQUE: Multidetector CT imaging of the abdomen and pelvis was performed following the standard protocol without IV contrast. RADIATION DOSE REDUCTION: This exam was performed according to the departmental dose-optimization program which includes automated exposure control, adjustment of the mA and/or kV according to patient size and/or use of iterative reconstruction technique. COMPARISON:  03/21/2022 FINDINGS: Lower chest: Airspace opacity in the right lower lobe concerning for pneumonia. Heart borderline in size. Pacer wires in the right heart. Aortic atherosclerosis. Hepatobiliary: No focal liver abnormality is seen. Status post cholecystectomy. No biliary dilatation. Pancreas:  No focal abnormality or ductal dilatation. Spleen: No focal abnormality.  Normal size. Adrenals/Urinary Tract: No adrenal abnormality. No focal renal abnormality. No stones or hydronephrosis. Urinary bladder is unremarkable. Stomach/Bowel: Stomach, large and small bowel grossly unremarkable. Vascular/Lymphatic: Aortic atherosclerosis. No evidence of aneurysm or adenopathy. Reproductive: Prior hysterectomy.  No adnexal masses. Other: No free fluid or free air. Musculoskeletal: No acute bony abnormality. IMPRESSION: Airspace opacity in the right lower lobe paddle with pneumonia. No acute findings in the abdomen or pelvis. Aortic atherosclerosis. Electronically Signed   By: Charlett Nose M.D.   On: 10/05/2022 21:48   DG Sacrum/Coccyx  Result Date: 10/05/2022 CLINICAL DATA:  Trauma, fall EXAM: SACRUM AND COCCYX - 2+ VIEW COMPARISON:  None Available. FINDINGS: No recent displaced fracture is seen. SI joints are unremarkable. Degenerative changes are noted in the visualized lower lumbar spine. IMPRESSION: No recent fracture is seen.  Lumbar spondylosis. Electronically Signed   By: Ernie Avena M.D.   On: 10/05/2022 20:20   DG Lumbar Spine Complete  Result Date: 10/05/2022 CLINICAL DATA:  Trauma, fall, pain EXAM: LUMBAR SPINE - COMPLETE 4+ VIEW COMPARISON:  08/07/2022 FINDINGS: There are 6 non-rib-bearing vertebrae in lumbar spine. This may suggest partial lumbarization of S1 vertebra.  No recent fracture is seen. Alignment of posterior margins of vertebral bodies is within normal limits. Degenerative changes are noted with small anterior bony spurs and facet hypertrophy, more so at the L4-L5 and L5-S1 levels. No significant interval changes are noted in bony structures. Arterial calcifications are seen in aorta and its major branches. Surgical clips are seen in gallbladder fossa. IMPRESSION: No recent fracture is seen. Lumbar spondylosis. No significant interval changes are noted since 08/07/2022.  Electronically Signed   By: Ernie Avena M.D.   On: 10/05/2022 20:18   DG Chest 2 View  Result Date: 10/05/2022 CLINICAL DATA:  Trauma, fall, fever, cough EXAM: CHEST - 2 VIEW COMPARISON:  03/21/2022 FINDINGS: Transverse diameter of heart is increased. There is prosthetic aortic valve. Pacemaker battery is seen in right infraclavicular region with tips of leads in right atrium and right ventricle. Lung fields are clear of any infiltrates or pulmonary edema. There is no pleural effusion or pneumothorax. There is mild extrinsic pressure over the right lateral margin of Aki in the lower neck, possibly due to enlarged right lobe of thyroid. IMPRESSION: No active cardiopulmonary disease. Electronically Signed   By: Ernie Avena M.D.   On: 10/05/2022 20:13   DG Cervical Spine 2-3 Views  Result Date: 10/05/2022 CLINICAL DATA:  Pain after fall EXAM: CERVICAL SPINE - 3 VIEW COMPARISON:  None Available. FINDINGS: Preserved vertebral body heights, alignment and prevertebral soft tissues. There is multilevel disc height loss with endplate osteophytes in particular from C4 through C7. No listhesis. Prominent facet degenerative changes seen particularly left mid cervical spine. Recommend continue precautions until clinical clearance and if there is further concern of injury recommend further workup with CT for higher sensitivity as a significant portion of acute cervical spine injuries are x-ray occult. Note is made of sternal wires at the edge of the imaging field with battery pack along the right upper chest. Osteopenia IMPRESSION: Osteopenia with moderate degenerative changes of the mid to lower cervical spine greatest at C6-7. Postop chest.  Pacemaker. Electronically Signed   By: Karen Kays M.D.   On: 10/05/2022 17:17   CT Head Wo Contrast  Result Date: 10/05/2022 CLINICAL DATA:  Head trauma, minor, normal mental status (Age 44-64y). Multiple falls. EXAM: CT HEAD WITHOUT CONTRAST TECHNIQUE:  Contiguous axial images were obtained from the base of the skull through the vertex without intravenous contrast. RADIATION DOSE REDUCTION: This exam was performed according to the departmental dose-optimization program which includes automated exposure control, adjustment of the mA and/or kV according to patient size and/or use of iterative reconstruction technique. COMPARISON:  Head CT 08/07/2022 FINDINGS: Brain: There is no evidence of an acute infarct, intracranial hemorrhage, mass, midline shift, or extra-axial fluid collection. Head a moderate-sized chronic infarct is again noted in the left frontal lobe. Patchy hypodensities elsewhere in the cerebral white matter bilaterally are unchanged and nonspecific but compatible with mild-to-moderate chronic small vessel ischemic disease. A chronic infarct is again noted inferiorly in the left basal ganglia, and there is also underlying chronic bilateral caudate nucleus volume loss. Vascular: Calcified atherosclerosis at the skull base. Skull: No fracture or suspicious osseous lesion. Sinuses/Orbits: Opacification of a single posterior right ethmoid air cell. Clear mastoid air cells. Left cataract extraction. Other: None. IMPRESSION: 1. No evidence of acute intracranial abnormality. 2. Chronic ischemia with multiple old infarcts as above. Electronically Signed   By: Sebastian Ache M.D.   On: 10/05/2022 17:03    EKG: Independently reviewed. Paced rythm   Assessment  and Plan: * Community acquired pneumonia No associated hypoxia.  Patient has been coughing for a week associated with poor appetite 1 episode of vomiting.  Found on CAT scan done for AKI which shows right lower lobe pneumonia.  Crackles are still heard in this region.  Treat with ceftriaxone and azithromycin.  Seizure [facility]) Is a chronic suspected diagnosis.  Patient is on lamotrigine at home which will be continued in the hospital.  Neurology note in Care Everywhere describes patient having blank  staring spells.  Continue with lamotrigine and telemetry in the hospital.  Monitor clinically  Falls Several unwitnessed falls for the last 1 week.  No history of focal weakness tremors loss of consciousness.  In the setting of pneumonia most consistent with deconditioning due to not eating and drinking.  Will request physical therapy evaluation in the morning  AKI (acute kidney injury) (HCC) Setting of of pneumonia, patient not eating and drinking for the last several days.  We will monitor patient response to IV hydration.  Check urinalysis, patient received a liter bolus in the ER  S/P AVR (aortic valve replacement) As per patient's mother, INR target is 2.5-3.5.  This is corroborated by my review of cardiology telephone note on September 27, 2022.  Patient has had a couple of strokes in her lifetime.  Current INR is 1.7 we will treat with heparin bridge.  Warfarin as per pharmacy protocol.  We will try to look for further documentation as to advise if patient needs bridging and what the exact target is  Junctional bradycardia This is chronic/remote problem.  Patient apparently has a pacemaker from [employer] as per mother  Diabetes mellitus type 2, uncomplicated (HCC) This is managed with Trulicity at home.  Patient does not otherwise take insulin or any other medications per d mom  Deafness Chronic issue, mother also advises that patient has chronic memory issues since her stroke.  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  Telemetry American sign language interpreter was used for this encounter  Home meds were reviewed over phone with mom. Metformin held for aki. Other meds re-ordred. Hydrochlorothiazide held due to soft bp. Metoprolol started at reduced dosage in AM with hold parameters. Patient is not on steroids at this time.    Advance Care Planning:   Code Status: Full Code   Consults: Not at this time  Family Communication: Mother at the bedside  Severity of Illness: The appropriate  patient status for this patient is INPATIENT. Inpatient status is judged to be reasonable and necessary in order to provide the required intensity of service to ensure the patient's safety. The patient's presenting symptoms, physical exam findings, and initial radiographic and laboratory data in the context of their chronic comorbidities is felt to place them at high risk for further clinical deterioration. Furthermore, it is not anticipated that the patient will be medically stable for discharge from the hospital within 2 midnights of admission.   * I certify that at the point of admission it is my clinical judgment that the patient will require inpatient hospital care spanning beyond 2 midnights from the point of admission due to high intensity of service, high risk for further deterioration and high frequency of surveillance required.*  Author: Nolberto Hanlon, MD 10/05/2022 11:17 PM  For on call review [email or web address].

## 2022-10-05 NOTE — ED Provider Notes (Signed)
[facility] Emergency Department Provider Note     Event Date/Time   First MD Initiated Contact with Patient 10/05/22 1852     (approximate)   History   Fall (Multiple today)   HPI  History limited by hearing impairment.  ASL interpreter [facility]) used for interview and exam.  Brianna Harris is a 62 y.o. female with hearing impairment, asthma, HTN, DM, HLD, heart valve on Coumadin, cardiac pacemaker in situ and history of CVA, presents to the ED for evaluation of generalized weakness with multiple falls today.  Patient has been on a course of azithromycin called in by her primary provider after she endorsed some cough over the last week.  She was evaluated at a local urgent care last week and had a negative viral panel test.  She presents today after she had multiple falls during transition from sitting to standing but she notes 1 fall she fell and hit her head but denies any LOC.  She also reports another fall she fell in the lobby but it was unwitnessed fall patient denies any associated nausea, vomiting, diarrhea.  Patient would endorse generally decreased appetite today and admits to poor overall intake of water, preferring diet sodas primary.  She would note that she has not had much urine output today when asked related to bladder and bowel habits.  She does endorse some buttocks and low back pain at this time related to her falls.  No other reports of any injury at this time.  Patient lives with her roommate is also hearing impaired the patient's mother is at bedside to provide some additional HPI.   Physical Exam   Triage Vital Signs: ED Triage Vitals [10/05/22 1620]  Enc Vitals Group     BP 110/77     Pulse Rate 87     Resp 16     Temp 98 F (36.7 C)     Temp Source Oral     SpO2 98 %     Weight 123 lb 7.3 oz (56 kg)     Height 5\' 2"  (1.575 m)     Head Circumference      Peak Flow      Pain Score 0     Pain Loc      Pain Edu?      Excl. in  GC?     Most recent vital signs: Vitals:   10/05/22 2145 10/05/22 2245  BP: 95/60   Pulse: 88   Resp: 18   Temp:  98.4 F (36.9 C)  SpO2: 96%     General Awake, no distress. NAD HEENT NCAT. PERRL. EOMI. No rhinorrhea. Mucous membranes are moist.  CV:  Good peripheral perfusion. RRR RESP:  Normal effort. CTA ABD:  No distention.  Soft and nontender.  Normal bowel sounds noted.  No CVA tenderness elicited. MSK:  Normal spinal alignment without significant midline tenderness, spasm, bony, or step-off.  Patient does endorse some pain to the sacrum and coccyx region NEURO: Cranial nerves II to XII grossly intact.   ED Results / Procedures / Treatments   Labs (all labs ordered are listed, but only abnormal results are displayed) Labs Reviewed  BASIC METABOLIC PANEL - Abnormal; Notable for the following components:      Result Value   Glucose, Bld 123 (*)    BUN 37 (*)    Creatinine, Ser 1.78 (*)    GFR, Estimated 32 (*)    All other components within normal  limits  CBC WITH DIFFERENTIAL/PLATELET - Abnormal; Notable for the following components:   WBC 11.7 (*)    Monocytes Absolute 1.1 (*)    All other components within normal limits  HEPATIC FUNCTION PANEL - Abnormal; Notable for the following components:   Indirect Bilirubin 1.0 (*)    All other components within normal limits  PROTIME-INR - Abnormal; Notable for the following components:   Prothrombin Time 20.3 (*)    INR 1.7 (*)    All other components within normal limits  APTT - Abnormal; Notable for the following components:   aPTT 39 (*)    All other components within normal limits  TROPONIN I (HIGH SENSITIVITY) - Abnormal; Notable for the following components:   Troponin I (High Sensitivity) 21 (*)    All other components within normal limits  URINALYSIS, ROUTINE W REFLEX MICROSCOPIC  HEMOGLOBIN A1C  URINALYSIS, COMPLETE (UACMP) WITH MICROSCOPIC  LAMOTRIGINE LEVEL  TROPONIN I (HIGH SENSITIVITY)     EKG  Vent. rate 88 BPM PR interval 200 ms QRS duration 164 ms QT/QTcB 440/532 ms P-R-T axes 56 -85 91 Atrial-sensed ventricular-paced rhythm Abnormal ECG When compared with ECG of 31-AUG-  RADIOLOGY  I personally viewed and evaluated these images as part of my medical decision making, as well as reviewing the written report by the radiologist.  ED Provider Interpretation: no acute findings  CT Renal Stone Study  Result Date: 10/05/2022 CLINICAL DATA:  Acute kidney injury.  Fall. EXAM: CT ABDOMEN AND PELVIS WITHOUT CONTRAST TECHNIQUE: Multidetector CT imaging of the abdomen and pelvis was performed following the standard protocol without IV contrast. RADIATION DOSE REDUCTION: This exam was performed according to the departmental dose-optimization program which includes automated exposure control, adjustment of the mA and/or kV according to patient size and/or use of iterative reconstruction technique. COMPARISON:  03/21/2022 FINDINGS: Lower chest: Airspace opacity in the right lower lobe concerning for pneumonia. Heart borderline in size. Pacer wires in the right heart. Aortic atherosclerosis. Hepatobiliary: No focal liver abnormality is seen. Status post cholecystectomy. No biliary dilatation. Pancreas: No focal abnormality or ductal dilatation. Spleen: No focal abnormality.  Normal size. Adrenals/Urinary Tract: No adrenal abnormality. No focal renal abnormality. No stones or hydronephrosis. Urinary bladder is unremarkable. Stomach/Bowel: Stomach, large and small bowel grossly unremarkable. Vascular/Lymphatic: Aortic atherosclerosis. No evidence of aneurysm or adenopathy. Reproductive: Prior hysterectomy.  No adnexal masses. Other: No free fluid or free air. Musculoskeletal: No acute bony abnormality. IMPRESSION: Airspace opacity in the right lower lobe paddle with pneumonia. No acute findings in the abdomen or pelvis. Aortic atherosclerosis. Electronically Signed   By: Charlett Nose M.D.    On: 10/05/2022 21:48   DG Sacrum/Coccyx  Result Date: 10/05/2022 CLINICAL DATA:  Trauma, fall EXAM: SACRUM AND COCCYX - 2+ VIEW COMPARISON:  None Available. FINDINGS: No recent displaced fracture is seen. SI joints are unremarkable. Degenerative changes are noted in the visualized lower lumbar spine. IMPRESSION: No recent fracture is seen.  Lumbar spondylosis. Electronically Signed   By: Ernie Avena M.D.   On: 10/05/2022 20:20   DG Lumbar Spine Complete  Result Date: 10/05/2022 CLINICAL DATA:  Trauma, fall, pain EXAM: LUMBAR SPINE - COMPLETE 4+ VIEW COMPARISON:  08/07/2022 FINDINGS: There are 6 non-rib-bearing vertebrae in lumbar spine. This may suggest partial lumbarization of S1 vertebra. No recent fracture is seen. Alignment of posterior margins of vertebral bodies is within normal limits. Degenerative changes are noted with small anterior bony spurs and facet hypertrophy, more so at the L4-L5 and  L5-S1 levels. No significant interval changes are noted in bony structures. Arterial calcifications are seen in aorta and its major branches. Surgical clips are seen in gallbladder fossa. IMPRESSION: No recent fracture is seen. Lumbar spondylosis. No significant interval changes are noted since 08/07/2022. Electronically Signed   By: Ernie Avena M.D.   On: 10/05/2022 20:18   DG Chest 2 View  Result Date: 10/05/2022 CLINICAL DATA:  Trauma, fall, fever, cough EXAM: CHEST - 2 VIEW COMPARISON:  03/21/2022 FINDINGS: Transverse diameter of heart is increased. There is prosthetic aortic valve. Pacemaker battery is seen in right infraclavicular region with tips of leads in right atrium and right ventricle. Lung fields are clear of any infiltrates or pulmonary edema. There is no pleural effusion or pneumothorax. There is mild extrinsic pressure over the right lateral margin of Aki in the lower neck, possibly due to enlarged right lobe of thyroid. IMPRESSION: No active cardiopulmonary disease.  Electronically Signed   By: Ernie Avena M.D.   On: 10/05/2022 20:13   DG Cervical Spine 2-3 Views  Result Date: 10/05/2022 CLINICAL DATA:  Pain after fall EXAM: CERVICAL SPINE - 3 VIEW COMPARISON:  None Available. FINDINGS: Preserved vertebral body heights, alignment and prevertebral soft tissues. There is multilevel disc height loss with endplate osteophytes in particular from C4 through C7. No listhesis. Prominent facet degenerative changes seen particularly left mid cervical spine. Recommend continue precautions until clinical clearance and if there is further concern of injury recommend further workup with CT for higher sensitivity as a significant portion of acute cervical spine injuries are x-ray occult. Note is made of sternal wires at the edge of the imaging field with battery pack along the right upper chest. Osteopenia IMPRESSION: Osteopenia with moderate degenerative changes of the mid to lower cervical spine greatest at C6-7. Postop chest.  Pacemaker. Electronically Signed   By: Karen Kays M.D.   On: 10/05/2022 17:17   CT Head Wo Contrast  Result Date: 10/05/2022 CLINICAL DATA:  Head trauma, minor, normal mental status (Age 58-64y). Multiple falls. EXAM: CT HEAD WITHOUT CONTRAST TECHNIQUE: Contiguous axial images were obtained from the base of the skull through the vertex without intravenous contrast. RADIATION DOSE REDUCTION: This exam was performed according to the departmental dose-optimization program which includes automated exposure control, adjustment of the mA and/or kV according to patient size and/or use of iterative reconstruction technique. COMPARISON:  Head CT 08/07/2022 FINDINGS: Brain: There is no evidence of an acute infarct, intracranial hemorrhage, mass, midline shift, or extra-axial fluid collection. Head a moderate-sized chronic infarct is again noted in the left frontal lobe. Patchy hypodensities elsewhere in the cerebral white matter bilaterally are unchanged and  nonspecific but compatible with mild-to-moderate chronic small vessel ischemic disease. A chronic infarct is again noted inferiorly in the left basal ganglia, and there is also underlying chronic bilateral caudate nucleus volume loss. Vascular: Calcified atherosclerosis at the skull base. Skull: No fracture or suspicious osseous lesion. Sinuses/Orbits: Opacification of a single posterior right ethmoid air cell. Clear mastoid air cells. Left cataract extraction. Other: None. IMPRESSION: 1. No evidence of acute intracranial abnormality. 2. Chronic ischemia with multiple old infarcts as above. Electronically Signed   By: Sebastian Ache M.D.   On: 10/05/2022 17:03     PROCEDURES:  Critical Care performed: YES  CRITICAL CARE Performed by: Lissa Hoard   Total critical care time: 30 minutes  Critical care time was exclusive of separately billable procedures and treating other patients.  Critical care was necessary  to treat or prevent imminent or life-threatening deterioration.  Critical care was time spent personally by me on the following activities: development of treatment plan with patient and/or surrogate as well as nursing, discussions with consultants, evaluation of patient's response to treatment, examination of patient, obtaining history from patient or surrogate, ordering and performing treatments and interventions, ordering and review of laboratory studies, ordering and review of radiographic studies, pulse oximetry and re-evaluation of patient's condition.   Procedures   MEDICATIONS ORDERED IN ED: Medications  azithromycin (ZITHROMAX) 500 mg in sodium chloride 0.9 % 250 mL IVPB (has no administration in time range)  dextromethorphan-guaiFENesin (MUCINEX DM) 30-600 MG per 12 hr tablet 1 tablet (has no administration in time range)  cefTRIAXone (ROCEPHIN) 1 g in sodium chloride 0.9 % 100 mL IVPB (has no administration in time range)  azithromycin (ZITHROMAX) 500 mg in sodium  chloride 0.9 % 250 mL IVPB (has no administration in time range)  insulin aspart (novoLOG) injection 0-15 Units (has no administration in time range)  insulin aspart (novoLOG) injection 0-5 Units (has no administration in time range)  heparin bolus via infusion 3,400 Units (has no administration in time range)  heparin ADULT infusion 100 units/mL (25000 units/263mL) (has no administration in time range)  lamoTRIgine (LAMICTAL) tablet 100 mg (has no administration in time range)  sodium chloride 0.9 % bolus 1,000 mL (0 mLs Intravenous Stopped 10/05/22 2326)  cefTRIAXone (ROCEPHIN) 2 g in sodium chloride 0.9 % 100 mL IVPB (0 g Intravenous Stopped 10/05/22 2326)     IMPRESSION / MDM / ASSESSMENT AND PLAN / ED COURSE  I reviewed the triage vital signs and the nursing notes.                              Differential diagnosis includes, but is not limited to, viral syndrome, bronchitis including COPD exacerbation, pneumonia, reactive airway disease including asthma, CHF including exacerbation with or without pulmonary/interstitial edema, pneumothorax, ACS, thoracic trauma, and pulmonary embolism, AKI, SDH, electrolyte abnormality, dehydration.   Patient's presentation is most consistent with acute complicated illness / injury requiring diagnostic workup.  Patient to the ED for evaluation of generalized weakness, multiple falls today with mechanical injury.  She is evaluated for complaints in ED, found to have reassuring imaging of the head neck, spine, and chest initially.  No evidence of any intrathoracic consolidation on plain film of the chest.  Further evaluation with lab work, revealed a mild bump in WBCs at 11.7 with evidence of an AKI with BUN/creatinine 37/1.78 respectively and a GFR decreased at 32.  Patient on a course of azithromycin with ongoing cough and congestion, generalized weakness and poor p.o. intake.  Clinically the patient appears to have failed outpatient therapy with acute  dehydration and AKI.  As such, patient was offered admission to the hospital service for IV antibiotic management and correction of her AKI.  Patient along with her mother are agreeable to the plan at this time.  Patient's diagnosis is consistent with CAP and AKI.  Patient will be started on empiric doses of azithromycin and and ceftriaxone in the ED.  We brought him to the hospital service for ongoing management evaluation of her symptoms.    Clinical Course as of 10/05/22 2352  Fri Oct 05, 2022  2247 INR(!): 1.7 [JM]    Clinical Course User Index [JM] Jax Kentner, Charlesetta Ivory, PA-C    FINAL CLINICAL IMPRESSION(S) / ED DIAGNOSES  Final diagnoses:  Community acquired pneumonia of right lower lobe of lung  AKI (acute kidney injury) (HCC)  Weakness     Rx / DC Orders   ED Discharge Orders     None        Note:  This document was prepared using Dragon voice recognition software and may include unintentional dictation errors.    Lissa Hoard, PA-C 10/05/22 2356    Pilar Jarvis, MD 10/06/22 [phone]

## 2022-10-05 NOTE — ED Notes (Signed)
Hospitalist at bedside 

## 2022-10-05 NOTE — Assessment & Plan Note (Signed)
This is managed with Trulicity at home.  Patient does not otherwise take insulin or any other medications per d mom

## 2022-10-05 NOTE — Assessment & Plan Note (Addendum)
Setting of of pneumonia, patient not eating and drinking for the last several days.  We will monitor patient response to IV hydration.  Check urinalysis, patient received a liter bolus in the ER

## 2022-10-05 NOTE — Assessment & Plan Note (Signed)
Is a chronic suspected diagnosis.  Patient is on lamotrigine at home which will be continued in the hospital.  Neurology note in Care Everywhere describes patient having blank staring spells.  Continue with lamotrigine and telemetry in the hospital.  Monitor clinically

## 2022-10-05 NOTE — ED Triage Notes (Signed)
Pt to ed from home for a fall. She has fallen multiple times today. She did hit her head. Pt is caox4, in no acute distress. Pt also states she fell in the lobby. No one in lobby witnessed this. Pt denies any N/V.   Pt needs ASL interpreter.

## 2022-10-05 NOTE — Assessment & Plan Note (Signed)
Chronic issue, mother also advises that patient has chronic memory issues since her stroke.  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  Telemetry American sign language interpreter was used for this encounter

## 2022-10-06 DIAGNOSIS — E876 Hypokalemia: Secondary | ICD-10-CM | POA: Diagnosis not present

## 2022-10-06 LAB — URINALYSIS, COMPLETE (UACMP) WITH MICROSCOPIC
Bacteria, UA: NONE SEEN
Bilirubin Urine: NEGATIVE
Glucose, UA: NEGATIVE mg/dL
Hgb urine dipstick: NEGATIVE
Ketones, ur: NEGATIVE mg/dL
Leukocytes,Ua: NEGATIVE
Nitrite: NEGATIVE
Protein, ur: NEGATIVE mg/dL
Specific Gravity, Urine: 1.009 (ref 1.005–1.030)
pH: 5 (ref 5.0–8.0)

## 2022-10-06 LAB — CBC
HCT: 38 % (ref 36.0–46.0)
Hemoglobin: 12.7 g/dL (ref 12.0–15.0)
MCH: 30.6 pg (ref 26.0–34.0)
MCHC: 33.4 g/dL (ref 30.0–36.0)
MCV: 91.6 fL (ref 80.0–100.0)
Platelets: 254 10*3/uL (ref 150–400)
RBC: 4.15 MIL/uL (ref 3.87–5.11)
RDW: 13 % (ref 11.5–15.5)
WBC: 7.7 10*3/uL (ref 4.0–10.5)
nRBC: 0 % (ref 0.0–0.2)

## 2022-10-06 LAB — BASIC METABOLIC PANEL
Anion gap: 10 (ref 5–15)
BUN: 24 mg/dL — ABNORMAL HIGH (ref 6–20)
CO2: 24 mmol/L (ref 22–32)
Calcium: 8.3 mg/dL — ABNORMAL LOW (ref 8.9–10.3)
Chloride: 106 mmol/L (ref 98–111)
Creatinine, Ser: 0.93 mg/dL (ref 0.44–1.00)
GFR, Estimated: >60 mL/min (ref >60)
Glucose, Bld: 141 mg/dL — ABNORMAL HIGH (ref 70–99)
Potassium: 2.6 mmol/L — CL (ref 3.5–5.1)
Sodium: 140 mmol/L (ref 135–145)

## 2022-10-06 LAB — URINALYSIS, ROUTINE W REFLEX MICROSCOPIC
Bilirubin Urine: NEGATIVE
Glucose, UA: NEGATIVE mg/dL
Hgb urine dipstick: NEGATIVE
Ketones, ur: NEGATIVE mg/dL
Leukocytes,Ua: NEGATIVE
Nitrite: NEGATIVE
Protein, ur: NEGATIVE mg/dL
Specific Gravity, Urine: 1.009 (ref 1.005–1.030)
pH: 5 (ref 5.0–8.0)

## 2022-10-06 LAB — CBG MONITORING, ED
Glucose-Capillary: 116 mg/dL — ABNORMAL HIGH (ref 70–99)
Glucose-Capillary: 119 mg/dL — ABNORMAL HIGH (ref 70–99)
Glucose-Capillary: 207 mg/dL — ABNORMAL HIGH (ref 70–99)
Glucose-Capillary: 98 mg/dL (ref 70–99)

## 2022-10-06 LAB — HEMOGLOBIN A1C
Hgb A1c MFr Bld: 5.5 % (ref 4.8–5.6)
Mean Plasma Glucose: 111.15 mg/dL

## 2022-10-06 LAB — PROTIME-INR
INR: 1.8 — ABNORMAL HIGH (ref 0.8–1.2)
Prothrombin Time: 20.7 seconds — ABNORMAL HIGH (ref 11.4–15.2)

## 2022-10-06 LAB — POTASSIUM: Potassium: 3.4 mmol/L — ABNORMAL LOW (ref 3.5–5.1)

## 2022-10-06 LAB — HIV ANTIBODY (ROUTINE TESTING W REFLEX): HIV Screen 4th Generation wRfx: NONREACTIVE

## 2022-10-06 LAB — TROPONIN I (HIGH SENSITIVITY): Troponin I (High Sensitivity): 22 ng/L — ABNORMAL HIGH (ref <18)

## 2022-10-06 LAB — HEPARIN LEVEL (UNFRACTIONATED)
Heparin Unfractionated: 0.64 IU/mL (ref 0.30–0.70)
Heparin Unfractionated: 0.16 IU/mL — ABNORMAL LOW (ref 0.30–0.70)
Heparin Unfractionated: 0.14 IU/mL — ABNORMAL LOW (ref 0.30–0.70)

## 2022-10-06 LAB — GLUCOSE, CAPILLARY: Glucose-Capillary: 131 mg/dL — ABNORMAL HIGH (ref 70–99)

## 2022-10-06 MED ORDER — ACETAMINOPHEN 650 MG RE SUPP: 650.0000 mg | Freq: Four times a day (QID) | RECTAL | Status: DC | PRN

## 2022-10-06 MED ORDER — TRAZODONE HCL 50 MG PO TABS
50.0000 mg | ORAL_TABLET | Freq: Once | ORAL | Status: AC
  Administered 2022-10-06: 50 mg via ORAL

## 2022-10-06 MED ORDER — WARFARIN SODIUM 5 MG PO TABS
5.0000 mg | ORAL_TABLET | Freq: Once | ORAL | Status: AC
  Administered 2022-10-06: 5 mg via ORAL
  Filled 2022-10-06: qty 1

## 2022-10-06 MED ORDER — SODIUM CHLORIDE 0.9% FLUSH
3.0000 mL | Freq: Two times a day (BID) | INTRAVENOUS | Status: DC
  Administered 2022-10-06 – 2022-10-08 (×5): 3 mL via INTRAVENOUS

## 2022-10-06 MED ORDER — HEPARIN BOLUS VIA INFUSION
1300.0000 [IU] | Freq: Once | INTRAVENOUS | Status: DC
  Filled 2022-10-06: qty 1300

## 2022-10-06 MED ORDER — POTASSIUM CHLORIDE IN NACL 20-0.9 MEQ/L-% IV SOLN
INTRAVENOUS | Status: DC
  Filled 2022-10-06: qty 1000

## 2022-10-06 MED ORDER — ATORVASTATIN CALCIUM 20 MG PO TABS
40.0000 mg | ORAL_TABLET | Freq: Every day | ORAL | Status: DC
  Administered 2022-10-06 – 2022-10-07 (×2): 40 mg via ORAL
  Filled 2022-10-06 (×2): qty 2

## 2022-10-06 MED ORDER — HEPARIN BOLUS VIA INFUSION
1600.0000 [IU] | Freq: Once | INTRAVENOUS | Status: AC
  Administered 2022-10-06: 1600 [IU] via INTRAVENOUS
  Filled 2022-10-06: qty 1600

## 2022-10-06 MED ORDER — ACETAMINOPHEN 325 MG PO TABS
650.0000 mg | ORAL_TABLET | Freq: Four times a day (QID) | ORAL | Status: DC | PRN
  Administered 2022-10-07: 650 mg via ORAL
  Filled 2022-10-06: qty 2

## 2022-10-06 MED ORDER — PANTOPRAZOLE SODIUM 20 MG PO TBEC
20.0000 mg | DELAYED_RELEASE_TABLET | Freq: Every day | ORAL | Status: DC
  Administered 2022-10-06 – 2022-10-08 (×3): 20 mg via ORAL
  Filled 2022-10-06 (×4): qty 1

## 2022-10-06 MED ORDER — POTASSIUM CHLORIDE 10 MEQ/100ML IV SOLN
10.0000 meq | INTRAVENOUS | Status: AC
  Administered 2022-10-06 (×4): 10 meq via INTRAVENOUS
  Filled 2022-10-06 (×4): qty 100

## 2022-10-06 MED ORDER — DULAGLUTIDE 0.75 MG/0.5ML ~~LOC~~ SOAJ: 0.7500 mg | SUBCUTANEOUS | Status: DC

## 2022-10-06 MED ORDER — METOPROLOL TARTRATE 25 MG PO TABS: 12.5000 mg | ORAL_TABLET | Freq: Two times a day (BID) | ORAL | Status: DC

## 2022-10-06 MED ORDER — WARFARIN - PHARMACIST DOSING INPATIENT
Freq: Every day | Status: DC
  Filled 2022-10-06: qty 1

## 2022-10-06 MED ORDER — AMITRIPTYLINE HCL 10 MG PO TABS
10.0000 mg | ORAL_TABLET | Freq: Every day | ORAL | Status: DC
  Administered 2022-10-06 – 2022-10-07 (×2): 10 mg via ORAL
  Filled 2022-10-06 (×2): qty 1

## 2022-10-06 NOTE — Progress Notes (Signed)
ANTICOAGULATION CONSULT NOTE  Pharmacy Consult for Warfarin with Heparin Bridge Indication: Aortic Mechanical Valve  No Known Allergies  Patient Measurements: Height: 5\' 2"  (157.5 cm) Weight: 56 kg (123 lb 7.3 oz) IBW/kg (Calculated) : 50.1 Heparin Dosing Weight: 56 kg  Vital Signs: Temp: 97.8 F (36.6 C) (06/15 1215) Temp Source: Oral (06/15 0852) BP: 115/66 (06/15 1230) Pulse Rate: 79 (06/15 1230)  Labs: Recent Labs    10/05/22 1938 10/05/22 2141 10/05/22 2231 10/06/22 0238 10/06/22 0512 10/06/22 1331  HGB 14.7  --   --   --  12.7  --   HCT 44.0  --   --   --  38.0  --   PLT 339  --   --   --  254  --   APTT  --  39*  --   --   --   --   LABPROT  --  20.3*  --   --  20.7*  --   INR  --  1.7*  --   --  1.8*  --   HEPARINUNFRC  --   --   --   --  0.64 0.16*  CREATININE 1.78*  --   --   --  0.93  --   TROPONINIHS  --   --  21* 22*  --   --      Estimated Creatinine Clearance: 51.5 mL/min (by C-G formula based on SCr of 0.93 mg/dL).   Medical History: Past Medical History:  Diagnosis Date   Asthma    Deafness    Depression    Diabetes mellitus without complication (HCC)    Family history of adverse reaction to anesthesia    Mother - PONV   GERD (gastroesophageal reflux disease)    Heart abnormality    per mother pt has bad heart murmor- sees Dr Lady Gary    Hyperlipidemia    Hypertension    Mechanical heart valve present    PONV (postoperative nausea and vomiting)    Presence of permanent cardiac pacemaker    Staring episodes     Medications:  Warfarin PTA- 1 mg + 3 mg (4 mg daily)- see telephone note 09/27/22  , last dose & time unknown,   Assessment: Pt is 62 yo female w/ hx of aortic mechanical valve on warfarin admitted for recurrent falls, PNA. Deaf- uses sign language.    HL: 6/15 0512 HL 0.64, therapeutic x 1 6/15 1331 HL= 0.16, subtherapeutic, inc 750>900 units/hr  INR: 6/14 2141 INR 1.7 6/15  INR 1.8   Goal of Therapy:  INR 2.5  -3.5 Heparin level 0.3-0.7 units/ml Monitor platelets by anticoagulation protocol: Yes   Plan:  Heparin Drip:  6/15 1331 HL= 0.16, subtherapeutic Bolus 1600 units x 1 increase heparin infusion to 900 units/hr Recheck HL in 8 hrs to confirm CBC daily while on heparin  Warfarin: Will order Warfarin 5 mg po x 1 dose DDI :Azithromycin Will bridge with heparin drip until INR therapeuticx2 INR daily   Bari Mantis PharmD Clinical Pharmacist 10/06/2022

## 2022-10-06 NOTE — ED Notes (Signed)
Steward Drone, NP was informed of pt's critically low potassium.

## 2022-10-06 NOTE — ED Notes (Signed)
Pt kept c/o inability to urinate. She stated that she has had urinary retention issues in the past. Pt was bladder scanned and over 800ccs of urine was noted to be in her bladder from the bladder scan. Foley cath placed and was documented. Urine sent from that. Pt tolerated well Pt's mother is still present in the room with pt.

## 2022-10-06 NOTE — Progress Notes (Signed)
ANTICOAGULATION CONSULT NOTE  Pharmacy Consult for Warfarin with Heparin Bridge Indication: Aortic Mechanical Valve  No Known Allergies  Patient Measurements: Height: 5\' 2"  (157.5 cm) Weight: 56 kg (123 lb 7.3 oz) IBW/kg (Calculated) : 50.1 Heparin Dosing Weight: 56 kg  Vital Signs: Temp: 97.5 F (36.4 C) (06/15 0852) Temp Source: Oral (06/15 0852) BP: 113/59 (06/15 0930) Pulse Rate: 86 (06/15 0930)  Labs: Recent Labs    10/05/22 1938 10/05/22 2141 10/05/22 2231 10/06/22 0238 10/06/22 0512  HGB 14.7  --   --   --  12.7  HCT 44.0  --   --   --  38.0  PLT 339  --   --   --  254  APTT  --  39*  --   --   --   LABPROT  --  20.3*  --   --  20.7*  INR  --  1.7*  --   --  1.8*  HEPARINUNFRC  --   --   --   --  0.64  CREATININE 1.78*  --   --   --  0.93  TROPONINIHS  --   --  21* 22*  --      Estimated Creatinine Clearance: 51.5 mL/min (by C-G formula based on SCr of 0.93 mg/dL).   Medical History: Past Medical History:  Diagnosis Date   Asthma    Deafness    Depression    Diabetes mellitus without complication (HCC)    Family history of adverse reaction to anesthesia    Mother - PONV   GERD (gastroesophageal reflux disease)    Heart abnormality    per mother pt has bad heart murmor- sees Dr Lady Gary    Hyperlipidemia    Hypertension    Mechanical heart valve present    PONV (postoperative nausea and vomiting)    Presence of permanent cardiac pacemaker    Staring episodes     Medications:  Warfarin PTA- 1 mg + 3 mg (4 mg daily)- see telephone note 09/27/22  , last dose & time unknown,   Assessment: Pt is 62 yo female w/ hx of aortic mechanical valve on warfarin admitted for recurrent falls, PNA. Deaf- uses sign language.    HL: 06/15 0512 HL 0.64, therapeutic x 1   INR: 06/14 2141 INR 1.7 06/15  INR 1.8   Goal of Therapy:  INR 2.5 -3.5 Heparin level 0.3-0.7 units/ml Monitor platelets by anticoagulation protocol: Yes   Plan:  Heparin Drip:   Continue heparin infusion at 750 units/hr Recheck HL in 8 hrs to confirm CBC daily while on heparin  Warfarin: Will order Warfarin 5 mg po x 1 dose DDI :Azithromycin Will bridge with heparin drip until INR therapeutic INR daily   Bari Mantis PharmD Clinical Pharmacist 10/06/2022

## 2022-10-06 NOTE — Progress Notes (Signed)
Arrived to floor alert and orientated by transport via wheelchair. No distress noted on room air. Foley cath intact with pink-red urine in drainage bag. Both iv intact to left arm. Uses a writing board. Orientated to room. Made comfortable in bed.

## 2022-10-06 NOTE — Progress Notes (Signed)
ANTICOAGULATION CONSULT NOTE  Pharmacy Consult for Warfarin with Heparin Bridge Indication: Mechanical Valve  No Known Allergies  Patient Measurements: Height: 5\' 2"  (157.5 cm) Weight: 56 kg (123 lb 7.3 oz) IBW/kg (Calculated) : 50.1 Heparin Dosing Weight: 56 kg  Vital Signs: Temp: 98.4 F (36.9 C) (06/14 2245) Temp Source: Oral (06/14 2245) BP: 119/57 (06/15 0430) Pulse Rate: 94 (06/15 0430)  Labs: Recent Labs    10/05/22 1938 10/05/22 2141 10/05/22 2231 10/06/22 0238 10/06/22 0512  HGB 14.7  --   --   --  12.7  HCT 44.0  --   --   --  38.0  PLT 339  --   --   --  254  APTT  --  39*  --   --   --   LABPROT  --  20.3*  --   --  20.7*  INR  --  1.7*  --   --  1.8*  HEPARINUNFRC  --   --   --   --  0.64  CREATININE 1.78*  --   --   --   --   TROPONINIHS  --   --  21* 22*  --      Estimated Creatinine Clearance: 26.9 mL/min (A) (by C-G formula based on SCr of 1.78 mg/dL (H)).   Medical History: Past Medical History:  Diagnosis Date   Asthma    Deafness    Depression    Diabetes mellitus without complication (HCC)    Family history of adverse reaction to anesthesia    Mother - PONV   GERD (gastroesophageal reflux disease)    Heart abnormality    per mother pt has bad heart murmor- sees Dr Lady Gary    Hyperlipidemia    Hypertension    Mechanical heart valve present    PONV (postoperative nausea and vomiting)    Presence of permanent cardiac pacemaker    Staring episodes     Medications:  Warfarin - med rec pending, last dose & time unknown, INR at admission 1.7.  Assessment: Pt is 62 yo female w/ hx of mechanical valve on warfarin.  06/15 0512 INR 1.8 / HL 0.64, therapeutic x 1  Goal of Therapy:  INR 2.5 -3.5 Heparin level 0.3-0.7 units/ml Monitor platelets by anticoagulation protocol: Yes   Plan:  Continue heparin infusion at 750 units/hr Recheck HL in 8 hrs to confirm CBC daily while on heparin  Otelia Sergeant, PharmD, [facility] 10/06/2022 6:23  AM

## 2022-10-06 NOTE — Progress Notes (Addendum)
ANTICOAGULATION CONSULT NOTE  Pharmacy Consult for Warfarin with Heparin Bridge Indication: Aortic Mechanical Valve  No Known Allergies  Patient Measurements: Height: 5\' 2"  (157.5 cm) Weight: 56 kg (123 lb 7.3 oz) IBW/kg (Calculated) : 50.1 Heparin Dosing Weight: 56 kg  Vital Signs: Temp: 97.6 F (36.4 C) (06/15 2050) Temp Source: Oral (06/15 1710) BP: 97/61 (06/15 2050) Pulse Rate: 83 (06/15 2050)  Labs: Recent Labs    10/05/22 1938 10/05/22 2141 10/05/22 2231 10/06/22 0238 10/06/22 0512 10/06/22 1331 10/06/22 2104  HGB 14.7  --   --   --  12.7  --   --   HCT 44.0  --   --   --  38.0  --   --   PLT 339  --   --   --  254  --   --   APTT  --  39*  --   --   --   --   --   LABPROT  --  20.3*  --   --  20.7*  --   --   INR  --  1.7*  --   --  1.8*  --   --   HEPARINUNFRC  --   --   --   --  0.64 0.16* 0.14*  CREATININE 1.78*  --   --   --  0.93  --   --   TROPONINIHS  --   --  21* 22*  --   --   --      Estimated Creatinine Clearance: 51.5 mL/min (by C-G formula based on SCr of 0.93 mg/dL).   Medical History: Past Medical History:  Diagnosis Date   Asthma    Deafness    Depression    Diabetes mellitus without complication (HCC)    Family history of adverse reaction to anesthesia    Mother - PONV   GERD (gastroesophageal reflux disease)    Heart abnormality    per mother pt has bad heart murmor- sees Dr Lady Gary    Hyperlipidemia    Hypertension    Mechanical heart valve present    PONV (postoperative nausea and vomiting)    Presence of permanent cardiac pacemaker    Staring episodes     Medications:  Warfarin PTA- 1 mg + 3 mg (4 mg daily)- see telephone note 09/27/22  , last dose & time unknown,   Assessment: Pt is 62 yo female w/ hx of aortic mechanical valve on warfarin admitted for recurrent falls, PNA. Deaf- uses sign language.    HL: 6/15 0512 HL 0.64, therapeutic x 1 6/15 1331 HL= 0.16, subtherapeutic, inc 750>900 units/hr 6/15 2104 HL=0.14,  subtherapeutic (notified by RN that when patient arrived to unit Heparin was not infusing)  INR: 6/14 2141 INR 1.7 6/15  INR 1.8   Goal of Therapy:  INR 2.5 -3.5 Heparin level 0.3-0.7 units/ml Monitor platelets by anticoagulation protocol: Yes   Plan:  Heparin Drip:  6/15 2104 HL= 0.14, subtherapeutic Bolus 1600 units x 1 increase heparin infusion to 1050 units/hr Recheck HL in 6 hrs to confirm CBC daily while on heparin  Notified by RN that Heparin was not infusing when patient arrived to floor. Will continue with bolus dose but decrease rate back to 900 units/hr  Warfarin: Will order Warfarin 5 mg po x 1 dose DDI :Azithromycin Will bridge with heparin drip until INR therapeuticx2 INR daily   Clovia Cuff, PharmD, BCPS 10/06/2022 9:37 PM

## 2022-10-06 NOTE — Progress Notes (Signed)
PT Cancellation Note  Patient Details Name: Brianna Harris MRN: [id number] DOB: May 09, 1960   Cancelled Treatment:    Reason Eval/Treat Not Completed: Patient not medically ready.  PT consult received.  Chart reviewed.  Pt's potassium noted to be decreased to 2.6 this morning.  Per PT guidelines for low K+, will hold PT at this time and re-attempt PT evaluation at a later date/time as medically appropriate.  Hendricks Limes, PT 10/06/22, 8:08 AM

## 2022-10-06 NOTE — Progress Notes (Addendum)
PROGRESS NOTE    Brianna Harris   ZOX:[id number] DOB: 03/16/1961  DOA: 10/05/2022 Date of Service: 10/06/22 PCP: Gracelyn Nurse, MD     Brief Narrative / Hospital Course:   Brianna Harris is a 62 y.o. 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  Patient was in her usual state of health till about a week ago when patient had a new onset of cough. Started on azithromycin outpatient. Weakness, falls so brought to ED.  06/14: To ED. Labs show AKI (Cr 1.78, baseline 0.8), CXR shows pneumonia.  06/15: hypokalemia 2.6. AKI resolved. Foley placed overnight d/t urinary retention. Bridging w/ heparin to therapeutic coumadin. PT eval pending.   Consultants:  none  Procedures: none      ASSESSMENT & PLAN:   Principal Problem:   Community acquired pneumonia Active Problems:   Deafness   Diabetes mellitus type 2, uncomplicated (HCC)   Junctional bradycardia   S/P AVR (aortic valve replacement)   CAP (community acquired pneumonia)   AKI (acute kidney injury) (HCC)   Falls   Seizure (HCC)   Community acquired pneumonia No associated hypoxia, no sepsis.   ceftriaxone and azithromycin.  AKI (acute kidney injury) (HCC) - resolved Setting of of pneumonia, patient not eating and drinking for the last several days.   IV hydration  Monitor BMP  Hypokalemia, severe Replace as needed Monitor BMP  Falls In the setting of pneumonia most consistent with deconditioning due to not eating and drinking.   PT/OT pending K correction   S/P AVR (aortic valve replacement) Subtherapeutic INR  heparin bridge.   Warfarin as per pharmacy protocol.   Junctional bradycardia, s/p pacemaker placement  On Metoprolol outpatient, unclear why Low BP  here, hold metoprolol  Diabetes mellitus type 2, uncomplicated (HCC) This is managed with Trulicity and metformin at home.   A1C  Deafness Chronic issue, mother also advises that patient has chronic memory issues since her stroke.  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   American sign language interpreter  Hx Seizure (HCC) Is a chronic suspected diagnosis.  Neurology note in Care Everywhere describes patient having blank staring spells.  Lamictal level Continue home lamictal      DVT prophylaxis: on heparin bridging to coumadin  Pertinent IV fluids/nutrition: stopped IV fluids w/ AKI resolved Central lines / invasive devices: Foley placed by night RN d/t urinary retention, will d/c today   Code Status: FULL CODE ACP documentation reviewed: 10/06/22 none on file  Current Admission Status: inpatient   TOC needs / Dispo plan: TBD pending PT/OT Barriers to discharge / significant pending items: hypokalemia, bridghing to therapeutic INR, anticipate here 1-2 more days              Subjective / Brief ROS:  ASL interpreter used  Patient reports feeling ok this morning Denies CP/SOB.  Pain controlled.  Denies new weakness.  Reports has been dealing with urinary retention for some time, has appt w/ urology next week She and mom are concerned about weakness  Family Communication: mother and friend at bedside on rounds     Objective Findings:  Vitals:   10/06/22 1215 10/06/22 1230 10/06/22 1330 10/06/22 1430  BP:  115/66 112/84 (!) 117/53  Pulse:  79 87 92  Resp:    18  Temp: 97.8 F (36.6 C)     TempSrc:      SpO2:  97% 99% 98%  Weight:      Height:        Intake/Output Summary (Last 24 hours) at 10/06/2022 1545 Last data filed at 10/06/2022 1505 Gross per 24 hour  Intake --  Output 3400 ml  Net -3400 ml   Filed Weights   10/05/22 1620  Weight: 56 kg    Examination:  Physical Exam Constitutional:      General: She is not in acute distress.     Appearance: She is not ill-appearing.  Cardiovascular:     Rate and Rhythm: Normal rate and regular rhythm.     Heart sounds: Normal heart sounds.  Pulmonary:     Effort: Pulmonary effort is normal.     Breath sounds: Normal breath sounds.  Abdominal:     General: Abdomen is flat.     Palpations: Abdomen is soft.  Musculoskeletal:     Right lower leg: No edema.     Left lower leg: No edema.  Skin:    General: Skin is warm and dry.  Neurological:     General: No focal deficit present.     Mental Status: She is alert and oriented to person, place, and time.          Scheduled Medications:   amitriptyline  10 mg Oral QHS   atorvastatin  40 mg Oral QHS   dextromethorphan-guaiFENesin  1 tablet Oral BID   insulin aspart  0-15 Units Subcutaneous TID WC   insulin aspart  0-5 Units Subcutaneous QHS   lamoTRIgine  100 mg Oral BID   pantoprazole  20 mg Oral Daily   sodium chloride flush  3 mL Intravenous Q12H   warfarin  5 mg Oral ONCE-1600   Warfarin - Pharmacist Dosing Inpatient   Does not apply q1600    Continuous Infusions:  azithromycin     cefTRIAXone (ROCEPHIN)  IV Stopped (10/06/22 1035)   heparin 900 Units/hr (10/06/22 1505)    PRN Medications:  acetaminophen **OR** acetaminophen  Antimicrobials from admission:  Anti-infectives (From admission, onward)    Start     Dose/Rate Route Frequency Ordered Stop   10/06/22 2200  azithromycin (ZITHROMAX) 500 mg in sodium chloride 0.9 % 250 mL IVPB        500 mg 250 mL/hr over 60 Minutes Intravenous Every 24 hours 10/05/22 2258 10/08/22 2159   10/06/22 1000  cefTRIAXone (ROCEPHIN) 1 g in sodium chloride 0.9 % 100 mL IVPB        1 g 200 mL/hr over 30 Minutes Intravenous Every 24 hours 10/05/22 2258     10/05/22 2230  cefTRIAXone (ROCEPHIN) 2 g in sodium chloride 0.9 % 100 mL IVPB        2 g 200 mL/hr over 30 Minutes Intravenous  Once 10/05/22 2222 10/05/22 2326   10/05/22 2230  azithromycin (ZITHROMAX) 500 mg in sodium  chloride 0.9 % 250 mL IVPB        500 mg 250 mL/hr over 60 Minutes Intravenous  Once 10/05/22 2222 10/06/22 0058           Data Reviewed:  I have personally reviewed the following...  CBC: Recent Labs  Lab 10/05/22 1938 10/06/22 0512  WBC 11.7* 7.7  NEUTROABS 6.7  --   HGB 14.7 12.7  HCT 44.0 38.0  MCV 91.1 91.6  PLT 339 254   Basic  Metabolic Panel: Recent Labs  Lab 10/05/22 1938 10/06/22 0512  NA 136 140  K 3.5 2.6*  CL 98 106  CO2 25 24  GLUCOSE 123* 141*  BUN 37* 24*  CREATININE 1.78* 0.93  CALCIUM 8.9 8.3*   GFR: Estimated Creatinine Clearance: 51.5 mL/min (by C-G formula based on SCr of 0.93 mg/dL). Liver Function Tests: Recent Labs  Lab 10/05/22 1938  AST 30  ALT 23  ALKPHOS 52  BILITOT 1.2  PROT 7.2  ALBUMIN 4.3   No results for input(s): "LIPASE", "AMYLASE" in the last 168 hours. No results for input(s): "AMMONIA" in the last 168 hours. Coagulation Profile: Recent Labs  Lab 10/05/22 2141 10/06/22 0512  INR 1.7* 1.8*   Cardiac Enzymes: No results for input(s): "CKTOTAL", "CKMB", "CKMBINDEX", "TROPONINI" in the last 168 hours. BNP (last 3 results) No results for input(s): "PROBNP" in the last 8760 hours. HbA1C: Recent Labs    10/05/22 1938  HGBA1C 5.5   CBG: Recent Labs  Lab 10/06/22 0006 10/06/22 0847 10/06/22 1233  GLUCAP 207* 116* 98   Lipid Profile: No results for input(s): "CHOL", "HDL", "LDLCALC", "TRIG", "CHOLHDL", "LDLDIRECT" in the last 72 hours. Thyroid Function Tests: No results for input(s): "TSH", "T4TOTAL", "FREET4", "T3FREE", "THYROIDAB" in the last 72 hours. Anemia Panel: No results for input(s): "VITAMINB12", "FOLATE", "FERRITIN", "TIBC", "IRON", "RETICCTPCT" in the last 72 hours. Most Recent Urinalysis On File:     Component Value Date/Time   COLORURINE YELLOW (A) 10/06/2022 0032   COLORURINE YELLOW (A) 10/06/2022 0032   APPEARANCEUR CLEAR (A) 10/06/2022 0032   APPEARANCEUR CLEAR (A) 10/06/2022 0032    APPEARANCEUR Clear 08/27/2016 1550   LABSPEC 1.009 10/06/2022 0032   LABSPEC 1.009 10/06/2022 0032   PHURINE 5.0 10/06/2022 0032   PHURINE 5.0 10/06/2022 0032   GLUCOSEU NEGATIVE 10/06/2022 0032   GLUCOSEU NEGATIVE 10/06/2022 0032   HGBUR NEGATIVE 10/06/2022 0032   HGBUR NEGATIVE 10/06/2022 0032   BILIRUBINUR NEGATIVE 10/06/2022 0032   BILIRUBINUR NEGATIVE 10/06/2022 0032   BILIRUBINUR Negative 08/27/2016 1550   KETONESUR NEGATIVE 10/06/2022 0032   KETONESUR NEGATIVE 10/06/2022 0032   PROTEINUR NEGATIVE 10/06/2022 0032   PROTEINUR NEGATIVE 10/06/2022 0032   NITRITE NEGATIVE 10/06/2022 0032   NITRITE NEGATIVE 10/06/2022 0032   LEUKOCYTESUR NEGATIVE 10/06/2022 0032   LEUKOCYTESUR NEGATIVE 10/06/2022 0032   Sepsis Labs: @LABRCNTIP (procalcitonin:4,lacticidven:4) Microbiology: No results found for this or any previous visit (from the past 240 hour(s)).    Radiology Studies last 3 days: CT Renal Stone Study  Result Date: 10/05/2022 CLINICAL DATA:  Acute kidney injury.  Fall. EXAM: CT ABDOMEN AND PELVIS WITHOUT CONTRAST TECHNIQUE: Multidetector CT imaging of the abdomen and pelvis was performed following the standard protocol without IV contrast. RADIATION DOSE REDUCTION: This exam was performed according to the departmental dose-optimization program which includes automated exposure control, adjustment of the mA and/or kV according to patient size and/or use of iterative reconstruction technique. COMPARISON:  03/21/2022 FINDINGS: Lower chest: Airspace opacity in the right lower lobe concerning for pneumonia. Heart borderline in size. Pacer wires in the right heart. Aortic atherosclerosis. Hepatobiliary: No focal liver abnormality is seen. Status post cholecystectomy. No biliary dilatation. Pancreas: No focal abnormality or ductal dilatation. Spleen: No focal abnormality.  Normal size. Adrenals/Urinary Tract: No adrenal abnormality. No focal renal abnormality. No stones or hydronephrosis.  Urinary bladder is unremarkable. Stomach/Bowel: Stomach, large and small bowel grossly unremarkable. Vascular/Lymphatic: Aortic atherosclerosis. No evidence of aneurysm or adenopathy. Reproductive: Prior hysterectomy.  No adnexal masses. Other: No free  fluid or free air. Musculoskeletal: No acute bony abnormality. IMPRESSION: Airspace opacity in the right lower lobe paddle with pneumonia. No acute findings in the abdomen or pelvis. Aortic atherosclerosis. Electronically Signed   By: Charlett Nose M.D.   On: 10/05/2022 21:48   DG Sacrum/Coccyx  Result Date: 10/05/2022 CLINICAL DATA:  Trauma, fall EXAM: SACRUM AND COCCYX - 2+ VIEW COMPARISON:  None Available. FINDINGS: No recent displaced fracture is seen. SI joints are unremarkable. Degenerative changes are noted in the visualized lower lumbar spine. IMPRESSION: No recent fracture is seen.  Lumbar spondylosis. Electronically Signed   By: Ernie Avena M.D.   On: 10/05/2022 20:20   DG Lumbar Spine Complete  Result Date: 10/05/2022 CLINICAL DATA:  Trauma, fall, pain EXAM: LUMBAR SPINE - COMPLETE 4+ VIEW COMPARISON:  08/07/2022 FINDINGS: There are 6 non-rib-bearing vertebrae in lumbar spine. This may suggest partial lumbarization of S1 vertebra. No recent fracture is seen. Alignment of posterior margins of vertebral bodies is within normal limits. Degenerative changes are noted with small anterior bony spurs and facet hypertrophy, more so at the L4-L5 and L5-S1 levels. No significant interval changes are noted in bony structures. Arterial calcifications are seen in aorta and its major branches. Surgical clips are seen in gallbladder fossa. IMPRESSION: No recent fracture is seen. Lumbar spondylosis. No significant interval changes are noted since 08/07/2022. Electronically Signed   By: Ernie Avena M.D.   On: 10/05/2022 20:18   DG Chest 2 View  Result Date: 10/05/2022 CLINICAL DATA:  Trauma, fall, fever, cough EXAM: CHEST - 2 VIEW COMPARISON:   03/21/2022 FINDINGS: Transverse diameter of heart is increased. There is prosthetic aortic valve. Pacemaker battery is seen in right infraclavicular region with tips of leads in right atrium and right ventricle. Lung fields are clear of any infiltrates or pulmonary edema. There is no pleural effusion or pneumothorax. There is mild extrinsic pressure over the right lateral margin of Aki in the lower neck, possibly due to enlarged right lobe of thyroid. IMPRESSION: No active cardiopulmonary disease. Electronically Signed   By: Ernie Avena M.D.   On: 10/05/2022 20:13   DG Cervical Spine 2-3 Views  Result Date: 10/05/2022 CLINICAL DATA:  Pain after fall EXAM: CERVICAL SPINE - 3 VIEW COMPARISON:  None Available. FINDINGS: Preserved vertebral body heights, alignment and prevertebral soft tissues. There is multilevel disc height loss with endplate osteophytes in particular from C4 through C7. No listhesis. Prominent facet degenerative changes seen particularly left mid cervical spine. Recommend continue precautions until clinical clearance and if there is further concern of injury recommend further workup with CT for higher sensitivity as a significant portion of acute cervical spine injuries are x-ray occult. Note is made of sternal wires at the edge of the imaging field with battery pack along the right upper chest. Osteopenia IMPRESSION: Osteopenia with moderate degenerative changes of the mid to lower cervical spine greatest at C6-7. Postop chest.  Pacemaker. Electronically Signed   By: Karen Kays M.D.   On: 10/05/2022 17:17   CT Head Wo Contrast  Result Date: 10/05/2022 CLINICAL DATA:  Head trauma, minor, normal mental status (Age 16-64y). Multiple falls. EXAM: CT HEAD WITHOUT CONTRAST TECHNIQUE: Contiguous axial images were obtained from the base of the skull through the vertex without intravenous contrast. RADIATION DOSE REDUCTION: This exam was performed according to the departmental  dose-optimization program which includes automated exposure control, adjustment of the mA and/or kV according to patient size and/or use of iterative reconstruction technique. COMPARISON:  Head CT 08/07/2022 FINDINGS: Brain: There is no evidence of an acute infarct, intracranial hemorrhage, mass, midline shift, or extra-axial fluid collection. Head a moderate-sized chronic infarct is again noted in the left frontal lobe. Patchy hypodensities elsewhere in the cerebral white matter bilaterally are unchanged and nonspecific but compatible with mild-to-moderate chronic small vessel ischemic disease. A chronic infarct is again noted inferiorly in the left basal ganglia, and there is also underlying chronic bilateral caudate nucleus volume loss. Vascular: Calcified atherosclerosis at the skull base. Skull: No fracture or suspicious osseous lesion. Sinuses/Orbits: Opacification of a single posterior right ethmoid air cell. Clear mastoid air cells. Left cataract extraction. Other: None. IMPRESSION: 1. No evidence of acute intracranial abnormality. 2. Chronic ischemia with multiple old infarcts as above. Electronically Signed   By: Sebastian Ache M.D.   On: 10/05/2022 17:03             LOS: 1 day    Time spent: 50 min    Sunnie Nielsen, DO [facility] 10/06/2022, 3:45 PM    Dictation software may have been used to generate the above note. Typos may occur and escape review in typed/dictated notes. Please contact Dr Lyn Hollingshead directly for clarity if needed.  Staff may message me via secure chat in Epic  but this may not receive an immediate response,  please page me for urgent matters!  If 7PM-7AM, please contact night coverage [email or web address]

## 2022-10-06 NOTE — Evaluation (Signed)
Physical Therapy Evaluation Patient Details Name: Brianna Harris MRN: [id number] DOB: 1960-06-27 Today's Date: 10/06/2022  History of Present Illness  Pt is a 62 y.o. female presenting to hospital 10/05/22 s/p multiple falls and generalized weakness.  Pt admitted with community acquired PNA, seizure, falls, AKI.  PMH includes hearing impairment, asthma, htn, DM, HLD, heart valve on Coumadin, cardiac pacemaker in situ, h/o CVA.  Per chart pt has chronic memory issues since her stroke (per pt's mother); deafness present since birth.  Clinical Impression  Sign Language interpreter via video interpreter Ethelene Browns [phone]) utilized during session.  Prior to hospital admission, pt was independent with functional mobility; h/o multiple recent falls; lives with a roommate on main level of home with 2 STE L railing.  Pt reporting mild LBP during session.  Currently pt is mod assist semi-supine to sitting edge of bed; min assist for transfers with B UE support; min assist to take steps in place and sidestep R/L along bed (assist required for balance); and SBA sit to supine in bed.  Pt demonstrating generalized weakness and requiring assist for balance during standing activities.  Pt would currently benefit from skilled PT to address noted impairments and functional limitations (see below for any additional details).  Upon hospital discharge, pt would benefit from ongoing therapy (pt requesting home discharge).    Recommendations for follow up therapy are one component of a multi-disciplinary discharge planning process, led by the attending physician.  Recommendations may be updated based on patient status, additional functional criteria and insurance authorization.  Follow Up Recommendations Can patient physically be transported by private vehicle: Yes     Assistance Recommended at Discharge Frequent or constant Supervision/Assistance  Patient can return home with the following  A little help with walking  and/or transfers;A little help with bathing/dressing/bathroom;Assistance with cooking/housework;Assist for transportation;Help with stairs or ramp for entrance    Equipment Recommendations  (TBD)  Recommendations for Other Services       Functional Status Assessment Patient has had a recent decline in their functional status and demonstrates the ability to make significant improvements in function in a reasonable and predictable amount of time.     Precautions / Restrictions Precautions Precautions: Fall Restrictions Weight Bearing Restrictions: No      Mobility  Bed Mobility Overal bed mobility: Needs Assistance Bed Mobility: Supine to Sit, Sit to Supine     Supine to sit: Mod assist (assist for trunk) Sit to supine: Supervision (SBA for lines/safety)   General bed mobility comments: vc's/visual cues for technique and use of bed rail    Transfers Overall transfer level: Needs assistance Equipment used:  (B UE support) Transfers: Sit to/from Stand Sit to Stand: Min assist           General transfer comment: x3 trials standing from bed; assist to come to full stand and control descent sitting    Ambulation/Gait Ambulation/Gait assistance: Min assist Gait Distance (Feet):  (x10 steps in place B LE's; pt sidestepped to R/L along bed a couple times) Assistive device:  (B UE support (pt holding onto therapists arms))         General Gait Details: assist to steady in standing  Stairs            Wheelchair Mobility    Modified Rankin (Stroke Patients Only)       Balance Overall balance assessment: Needs assistance Sitting-balance support: No upper extremity supported, Feet supported Sitting balance-Leahy Scale: Good Sitting balance - Comments: steady reaching within  BOS   Standing balance support: Bilateral upper extremity supported Standing balance-Leahy Scale: Fair Standing balance comment: steady static standing with B UE support                              Pertinent Vitals/Pain Pain Assessment Pain Assessment: Faces Faces Pain Scale: Hurts little more Pain Location: low back Pain Descriptors / Indicators: Sore Pain Intervention(s): Limited activity within patient's tolerance, Monitored during session, Repositioned Vitals (HR and O2 on room air) stable and WFL throughout treatment session.    Home Living Family/patient expects to be discharged to:: Private residence Living Arrangements: Non-relatives/Friends (Roommate) Available Help at Discharge: Family;Available PRN/intermittently Type of Home: House Home Access: Stairs to enter Entrance Stairs-Rails: Left Entrance Stairs-Number of Steps: 2   Home Layout: Two level;Able to live on main level with bedroom/bathroom Home Equipment: Gilmer Mor - single point      Prior Function Prior Level of Function : Independent/Modified Independent             Mobility Comments: Multiple recent falls.       Hand Dominance        Extremity/Trunk Assessment   Upper Extremity Assessment Upper Extremity Assessment: Generalized weakness    Lower Extremity Assessment Lower Extremity Assessment: Generalized weakness       Communication   Communication: Interpreter utilized;Deaf (Sign Language Interpreter Ethelene Browns ID# [phone] utilized during session.)  Cognition Arousal/Alertness: Awake/alert Behavior During Therapy: WFL for tasks assessed/performed Overall Cognitive Status: History of cognitive impairments - at baseline                                          General Comments  Nursing cleared pt for participation in physical therapy.  Pt agreeable to PT session.  Pt's mother (and later pt's roommate) present during session.    Exercises     Assessment/Plan    PT Assessment Patient needs continued PT services  PT Problem List Decreased strength;Decreased activity tolerance;Decreased balance;Decreased mobility;Decreased knowledge of use of  DME;Decreased knowledge of precautions;Pain       PT Treatment Interventions DME instruction;Gait training;Stair training;Functional mobility training;Therapeutic activities;Therapeutic exercise;Balance training;Patient/family education    PT Goals (Current goals can be found in the Care Plan section)  Acute Rehab PT Goals Patient Stated Goal: to improve strength and mobility PT Goal Formulation: With patient Time For Goal Achievement: 10/20/22 Potential to Achieve Goals: Good    Frequency Min 4X/week     Co-evaluation               AM-PAC PT "6 Clicks" Mobility  Outcome Measure Help needed turning from your back to your side while in a flat bed without using bedrails?: None Help needed moving from lying on your back to sitting on the side of a flat bed without using bedrails?: A Lot Help needed moving to and from a bed to a chair (including a wheelchair)?: A Little Help needed standing up from a chair using your arms (e.g., wheelchair or bedside chair)?: A Little Help needed to walk in hospital room?: A Little Help needed climbing 3-5 steps with a railing? : A Lot 6 Click Score: 17    End of Session Equipment Utilized During Treatment: Gait belt Activity Tolerance: Patient tolerated treatment well Patient left:  (nurse present assisting pt to recliner end of PT session; nurse to  set pt up with pt needs) Nurse Communication: Mobility status;Precautions PT Visit Diagnosis: Unsteadiness on feet (R26.81);Muscle weakness (generalized) (M62.81);History of falling (Z91.81);Repeated falls (R29.6);Other abnormalities of gait and mobility (R26.89);Pain Pain - part of body:  (low back)    Time: 1610-9604 PT Time Calculation (min) (ACUTE ONLY): 42 min   Charges:   PT Evaluation $PT Eval Low Complexity: 1 Low PT Treatments $Therapeutic Activity: 23-37 mins       Hendricks Limes, PT 10/06/22, 5:25 PM

## 2022-10-06 NOTE — ED Notes (Signed)
Pt ambulated from chair to bed with walker and 1 assist. Pt is comfortable in bed, NAD, no needs stated at this time.

## 2022-10-06 NOTE — Hospital Course (Addendum)
Brianna Harris is a 62 y.o. 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  Patient was in her usual state of health till about a week ago when patient had a new onset of cough. Started on azithromycin outpatient. Weakness, falls so brought to ED.  06/14: To ED. Labs show AKI (Cr 1.78, baseline 0.8), CXR shows pneumonia.  06/15: hypokalemia 2.6. AKI resolved. Foley placed overnight d/t urinary retention. Bridging w/ heparin to therapeutic coumadin. PT eval pending.  06/16: INR still low, bridging w/ lovenox in anticipation of discharge. PT for [facility]. Can remove foley this evening for void trial. If labs ok and DME delivered by tomorrow, anticipate d/c.   Consultants:  none  Procedures: none      ASSESSMENT & PLAN:   Principal Problem:   Community acquired pneumonia Active Problems:   Deafness   Diabetes mellitus type 2, uncomplicated (HCC)   Junctional bradycardia   S/P AVR (aortic valve replacement)   CAP (community acquired pneumonia)   AKI (acute kidney injury) (HCC)   Falls   Seizure (HCC)   Community acquired pneumonia No associated hypoxia, no sepsis.   ceftriaxone and azithromycin. --> po augmentin + azithro  AKI (acute kidney injury) (HCC) - resolved Setting of of pneumonia, patient not eating and drinking for the last several days.   IV hydration --> po  Monitor BMP  Hypokalemia, severe - improved  Replace as needed Monitor BMP  Urinary retention Chronic  Foley placed by ED, can trial d/c and void on her own If not successful in voiding independently, can re-place Foley to follow w/ urology outpatient   Falls In the setting of pneumonia most consistent with deconditioning due to not eating and drinking.   PT/OT  S/P  AVR (aortic valve replacement) Subtherapeutic INR  Lovenox bridge.   Warfarin as per pharmacy protocol.   Junctional bradycardia, s/p pacemaker placement  On Metoprolol outpatient, unclear why Low BP here, hold metoprolol  Diabetes mellitus type 2, uncomplicated (HCC) This is managed with Trulicity and metformin at home.   A1C  Deafness Chronic issue, mother also advises that patient has chronic memory issues since her stroke.  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   American sign language interpreter  Hx Seizure (HCC) Is a chronic suspected diagnosis.  Neurology note in Care Everywhere describes patient having blank staring spells.  Lamictal level Continue home lamictal      DVT prophylaxis: on heparin bridging to coumadin  Pertinent IV fluids/nutrition: stopped IV fluids w/ AKI resolved Central lines / invasive devices: Foley placed by night RN d/t urinary retention, will d/c today   Code Status: FULL CODE ACP documentation reviewed: 10/06/22 none on file  Current Admission Status: inpatient   TOC needs / Dispo plan: TBD pending PT/OT Barriers to discharge / significant pending items: hypokalemia, bridghing to therapeutic INR, anticipate here 1-2 more days

## 2022-10-07 DIAGNOSIS — W19XXXA Unspecified fall, initial encounter: Secondary | ICD-10-CM

## 2022-10-07 DIAGNOSIS — E119 Type 2 diabetes mellitus without complications: Secondary | ICD-10-CM | POA: Diagnosis not present

## 2022-10-07 DIAGNOSIS — H9193 Unspecified hearing loss, bilateral: Secondary | ICD-10-CM | POA: Diagnosis not present

## 2022-10-07 DIAGNOSIS — R001 Bradycardia, unspecified: Secondary | ICD-10-CM

## 2022-10-07 DIAGNOSIS — J189 Pneumonia, unspecified organism: Secondary | ICD-10-CM | POA: Diagnosis not present

## 2022-10-07 DIAGNOSIS — R569 Unspecified convulsions: Secondary | ICD-10-CM

## 2022-10-07 DIAGNOSIS — Z952 Presence of prosthetic heart valve: Secondary | ICD-10-CM

## 2022-10-07 DIAGNOSIS — N179 Acute kidney failure, unspecified: Secondary | ICD-10-CM

## 2022-10-07 LAB — CBC
HCT: 35.7 % — ABNORMAL LOW (ref 36.0–46.0)
Hemoglobin: 11.7 g/dL — ABNORMAL LOW (ref 12.0–15.0)
MCH: 30 pg (ref 26.0–34.0)
MCHC: 32.8 g/dL (ref 30.0–36.0)
MCV: 91.5 fL (ref 80.0–100.0)
Platelets: 239 10*3/uL (ref 150–400)
RBC: 3.9 MIL/uL (ref 3.87–5.11)
RDW: 13 % (ref 11.5–15.5)
WBC: 6.7 10*3/uL (ref 4.0–10.5)
nRBC: 0 % (ref 0.0–0.2)

## 2022-10-07 LAB — PROTIME-INR
INR: 1.6 — ABNORMAL HIGH (ref 0.8–1.2)
Prothrombin Time: 19.3 seconds — ABNORMAL HIGH (ref 11.4–15.2)

## 2022-10-07 LAB — BASIC METABOLIC PANEL
Anion gap: 8 (ref 5–15)
BUN: 15 mg/dL (ref 6–20)
CO2: 25 mmol/L (ref 22–32)
Calcium: 8.3 mg/dL — ABNORMAL LOW (ref 8.9–10.3)
Chloride: 105 mmol/L (ref 98–111)
Creatinine, Ser: 0.73 mg/dL (ref 0.44–1.00)
GFR, Estimated: >60 mL/min (ref >60)
Glucose, Bld: 120 mg/dL — ABNORMAL HIGH (ref 70–99)
Potassium: 3.1 mmol/L — ABNORMAL LOW (ref 3.5–5.1)
Sodium: 138 mmol/L (ref 135–145)

## 2022-10-07 LAB — GLUCOSE, CAPILLARY
Glucose-Capillary: 83 mg/dL (ref 70–99)
Glucose-Capillary: 143 mg/dL — ABNORMAL HIGH (ref 70–99)
Glucose-Capillary: 78 mg/dL (ref 70–99)
Glucose-Capillary: 123 mg/dL — ABNORMAL HIGH (ref 70–99)

## 2022-10-07 LAB — HEPARIN LEVEL (UNFRACTIONATED)
Heparin Unfractionated: 0.23 IU/mL — ABNORMAL LOW (ref 0.30–0.70)
Heparin Unfractionated: 0.48 IU/mL (ref 0.30–0.70)

## 2022-10-07 LAB — MAGNESIUM: Magnesium: 2 mg/dL (ref 1.7–2.4)

## 2022-10-07 MED ORDER — ENOXAPARIN SODIUM 60 MG/0.6ML IJ SOSY
60.0000 mg | PREFILLED_SYRINGE | Freq: Two times a day (BID) | INTRAMUSCULAR | Status: DC
  Administered 2022-10-07 – 2022-10-08 (×2): 60 mg via SUBCUTANEOUS
  Filled 2022-10-07 (×2): qty 0.6

## 2022-10-07 MED ORDER — POTASSIUM CHLORIDE CRYS ER 20 MEQ PO TBCR
40.0000 meq | EXTENDED_RELEASE_TABLET | Freq: Once | ORAL | Status: AC
  Administered 2022-10-07: 40 meq via ORAL
  Filled 2022-10-07: qty 2

## 2022-10-07 MED ORDER — CHLORHEXIDINE GLUCONATE CLOTH 2 % EX PADS
6.0000 | MEDICATED_PAD | Freq: Every day | CUTANEOUS | Status: DC
  Administered 2022-10-07 – 2022-10-08 (×2): 6 via TOPICAL

## 2022-10-07 MED ORDER — WARFARIN SODIUM 7.5 MG PO TABS: 7.5000 mg | ORAL_TABLET | Freq: Every day | ORAL | 0 refills | Status: DC

## 2022-10-07 MED ORDER — HEPARIN BOLUS VIA INFUSION
800.0000 [IU] | Freq: Once | INTRAVENOUS | Status: AC
  Administered 2022-10-07: 800 [IU] via INTRAVENOUS
  Filled 2022-10-07: qty 800

## 2022-10-07 MED ORDER — HEPARIN (PORCINE) 25000 UT/250ML-% IV SOLN
1000.0000 [IU]/h | INTRAVENOUS | Status: DC
  Administered 2022-10-07: 1000 [IU]/h via INTRAVENOUS

## 2022-10-07 MED ORDER — ENOXAPARIN SODIUM 60 MG/0.6ML IJ SOSY: 60.0000 mg | PREFILLED_SYRINGE | Freq: Two times a day (BID) | INTRAMUSCULAR | 1 refills | Status: DC

## 2022-10-07 MED ORDER — POTASSIUM CHLORIDE CRYS ER 20 MEQ PO TBCR: 20.0000 meq | EXTENDED_RELEASE_TABLET | Freq: Two times a day (BID) | ORAL | 0 refills | Status: AC

## 2022-10-07 MED ORDER — AMOXICILLIN-POT CLAVULANATE 500-125 MG PO TABS: 1.0000 | ORAL_TABLET | Freq: Two times a day (BID) | ORAL | 0 refills | Status: AC

## 2022-10-07 MED ORDER — DM-GUAIFENESIN ER 30-600 MG PO TB12: 1.0000 | ORAL_TABLET | Freq: Two times a day (BID) | ORAL | 0 refills | Status: DC | PRN

## 2022-10-07 MED ORDER — WARFARIN SODIUM 7.5 MG PO TABS
7.5000 mg | ORAL_TABLET | Freq: Once | ORAL | Status: AC
  Administered 2022-10-07: 7.5 mg via ORAL
  Filled 2022-10-07: qty 1

## 2022-10-07 MED ORDER — AZITHROMYCIN 250 MG PO TABS: 250.0000 mg | ORAL_TABLET | Freq: Every day | ORAL | 0 refills | Status: AC

## 2022-10-07 NOTE — Plan of Care (Signed)
  Problem: Education: Goal: Ability to describe self-care measures that may prevent or decrease complications (Diabetes Survival Skills Education) will improve Outcome: Progressing   Problem: Coping: Goal: Ability to adjust to condition or change in health will improve Outcome: Progressing   Problem: Fluid Volume: Goal: Ability to maintain a balanced intake and output will improve Outcome: Progressing   Problem: Metabolic: Goal: Ability to maintain appropriate glucose levels will improve Outcome: Progressing   Problem: Nutritional: Goal: Maintenance of adequate nutrition will improve Outcome: Progressing   Problem: Skin Integrity: Goal: Risk for impaired skin integrity will decrease Outcome: Progressing   Problem: Tissue Perfusion: Goal: Adequacy of tissue perfusion will improve Outcome: Progressing   Problem: Coping: Goal: Level of anxiety will decrease Outcome: Progressing

## 2022-10-07 NOTE — Evaluation (Signed)
Occupational Therapy Evaluation Patient Details Name: Brianna Harris MRN: [id number] DOB: 1960-10-18 Today's Date: 10/07/2022   History of Present Illness Pt is a 62 y.o. female presenting to hospital 10/05/22 s/p multiple falls and generalized weakness.  Pt admitted with community acquired PNA, seizure, falls, AKI.  PMH includes hearing impairment, asthma, htn, DM, HLD, heart valve on Coumadin, cardiac pacemaker in situ, h/o CVA.  Per chart pt has chronic memory issues since her stroke (per pt's mother); deafness present since birth.   Clinical Impression   Patient received for OT evaluation. See flowsheet below for details of function. Generally, patient MOD (I) for bed mobility, CGA with RW for functional mobility, and MOD (I)- CGA for ADLs.  Slightly impulsive. ASL interpreter on tablet utilized throughout session. Pt's mother present throughout session and very involved in care, although mother does not live with her. Patient will benefit from continued OT while in acute care.      Recommendations for follow up therapy are one component of a multi-disciplinary discharge planning process, led by the attending physician.  Recommendations may be updated based on patient status, additional functional criteria and insurance authorization.   Assistance Recommended at Discharge Intermittent Supervision/Assistance  Patient can return home with the following A little help with walking and/or transfers;A little help with bathing/dressing/bathroom;Assistance with cooking/housework;Direct supervision/assist for medications management;Assist for transportation;Help with stairs or ramp for entrance    Functional Status Assessment  Patient has had a recent decline in their functional status and demonstrates the ability to make significant improvements in function in a reasonable and predictable amount of time.  Equipment Recommendations  Other (comment) (discussed shower chair or tub bench; defer to  [facility] to decide final need based on bathroom set up)    Recommendations for Other Services       Precautions / Restrictions Precautions Precautions: Fall Restrictions Weight Bearing Restrictions: No      Mobility Bed Mobility Overal bed mobility: Needs Assistance Bed Mobility: Supine to Sit     Supine to sit: Modified independent (Device/Increase time)     General bed mobility comments: visual cues    Transfers Overall transfer level: Needs assistance Equipment used: None Transfers: Sit to/from Stand Sit to Stand: Supervision           General transfer comment: stood without RW before OT was ready, then asks OT for RW; able to hold balance while standing statically. RW given.      Balance Overall balance assessment: Needs assistance Sitting-balance support: Feet supported Sitting balance-Leahy Scale: Good     Standing balance support: Bilateral upper extremity supported, Reliant on assistive device for balance Standing balance-Leahy Scale: Fair                             ADL either performed or assessed with clinical judgement   ADL Overall ADL's : Needs assistance/impaired   Eating/Feeding Details (indicate cue type and reason): anticipate set up from seated   Grooming Details (indicate cue type and reason): anticipate set up/SBA while standing       Lower Body Bathing Details (indicate cue type and reason): recommend seated showering with shower chair or tub transfer bench for all bathing due to hx of falls and decreased standing balance.     Lower Body Dressing: Supervision/safety Lower Body Dressing Details (indicate cue type and reason): Pt able to demonstrate donning/doffing nonskid socks while seated in chair; recommend mostly seated LB dressing  Toilet Transfer Details (indicate cue type and reason): anticipate set up   Toileting - Clothing Manipulation Details (indicate cue type and reason): anticipate supervision for  safety/balance   Tub/Shower Transfer Details (indicate cue type and reason): discussed recommendation for shower chair or tub transfer bench (defer to [facility] for assessment of bathroom and final recommendation/training; mother in agreement; mother very worried about pt's safety in transfers as she has a tub shower. Functional mobility during ADLs: Rolling walker (2 wheels);Min guard       Vision Baseline Vision/History: 1 Wears glasses Patient Visual Report: No change from baseline       Perception     Praxis      Pertinent Vitals/Pain Pain Assessment Pain Assessment: No/denies pain     Hand Dominance     Extremity/Trunk Assessment Upper Extremity Assessment Upper Extremity Assessment: Generalized weakness   Lower Extremity Assessment Lower Extremity Assessment: Generalized weakness   Cervical / Trunk Assessment Cervical / Trunk Assessment: Normal   Communication Communication Communication: Interpreter utilized;Deaf (Sign language interpreter Morrie Sheldon used on tablet during session.)   Cognition Arousal/Alertness: Awake/alert Behavior During Therapy: WFL for tasks assessed/performed Overall Cognitive Status: History of cognitive impairments - at baseline                                 General Comments: Pt slightly impulsive and also needing encouragement to participate; stating she is cold and pulling covers up; once explained that we need to work on mobility so she can get home, pt willing to participate.     General Comments  On room air.    Exercises     Shoulder Instructions      Home Living Family/patient expects to be discharged to:: Private residence Living Arrangements: Non-relatives/Friends (roommate) Available Help at Discharge: Family;Available PRN/intermittently (mother can assist, but also does not live in [place] but a little ways away) Type of Home: House Home Access: Stairs to enter [employer] of Steps: 2 Entrance  Stairs-Rails: Left Home Layout: Two level;Able to live on main level with bedroom/bathroom     Bathroom Shower/Tub: Chief Strategy Officer: Standard     Home Equipment: Cane - single point;Grab bars - tub/shower   Additional Comments: Mother expresses concerns about safety with transfers in/out of tub      Prior Functioning/Environment Prior Level of Function : Independent/Modified Independent             Mobility Comments: Multiple recent falls. ADLs Comments: (I) ADLs. Goes out to eat with roommate a lot. Does own laundry.        OT Problem List: Decreased activity tolerance;Impaired balance (sitting and/or standing);Decreased knowledge of use of DME or AE;Decreased safety awareness      OT Treatment/Interventions: Self-care/ADL training;Therapeutic exercise;Therapeutic activities;DME and/or AE instruction;Patient/family education    OT Goals(Current goals can be found in the care plan section) Acute Rehab OT Goals Patient Stated Goal: Go home OT Goal Formulation: With patient/family Time For Goal Achievement: 10/21/22 Potential to Achieve Goals: Good ADL Goals Pt Will Perform Grooming: with modified independence;standing Pt Will Perform Lower Body Bathing: with modified independence;sitting/lateral leans Pt Will Transfer to Toilet: with modified independence;ambulating;grab bars Pt Will Perform Tub/Shower Transfer: Tub transfer;rolling walker;ambulating;shower seat  OT Frequency: Min 2X/week    Co-evaluation PT/OT/SLP Co-Evaluation/Treatment: Yes Reason for Co-Treatment: For patient/therapist safety   OT goals addressed during session: ADL's and self-care      AM-PAC  OT "6 Clicks" Daily Activity     Outcome Measure Help from another person eating meals?: None Help from another person taking care of personal grooming?: A Little Help from another person toileting, which includes using toliet, bedpan, or urinal?: A Little Help from another person  bathing (including washing, rinsing, drying)?: A Little Help from another person to put on and taking off regular upper body clothing?: None Help from another person to put on and taking off regular lower body clothing?: None 6 Click Score: 21   End of Session Equipment Utilized During Treatment: Rolling walker (2 wheels);Gait belt Nurse Communication: Mobility status  Activity Tolerance: Patient tolerated treatment well Patient left: in chair;with call bell/phone within reach;with chair alarm set;with family/visitor present (mother and roommate in room; MD in room)  OT Visit Diagnosis: Unsteadiness on feet (R26.81);History of falling (Z91.81)                Time: 1610-9604 OT Time Calculation (min): 23 min Charges:  OT General Charges $OT Visit: 1 Visit OT Evaluation $OT Eval Moderate Complexity: 1 Mod  Laterrance Nauta Junie Panning, MS, OTR/L  Alvester Morin 10/07/2022, 2:45 PM

## 2022-10-07 NOTE — Progress Notes (Signed)
ANTICOAGULATION CONSULT NOTE  Pharmacy Consult for Warfarin with Heparin Bridge Indication: Aortic Mechanical Valve  No Known Allergies  Patient Measurements: Height: 5\' 2"  (157.5 cm) Weight: 56 kg (123 lb 7.3 oz) IBW/kg (Calculated) : 50.1 Heparin Dosing Weight: 56 kg  Vital Signs: Temp: 97.8 F (36.6 C) (06/16 0433) BP: 146/59 (06/16 0433) Pulse Rate: 79 (06/16 0433)  Labs: Recent Labs    10/05/22 1938 10/05/22 1938 10/05/22 2141 10/05/22 2231 10/06/22 0238 10/06/22 0512 10/06/22 1331 10/06/22 2104 10/07/22 0413  HGB 14.7  --   --   --   --  12.7  --   --  11.7*  HCT 44.0  --   --   --   --  38.0  --   --  35.7*  PLT 339  --   --   --   --  254  --   --  239  APTT  --   --  39*  --   --   --   --   --   --   LABPROT  --   --  20.3*  --   --  20.7*  --   --  19.3*  INR  --   --  1.7*  --   --  1.8*  --   --  1.6*  HEPARINUNFRC  --    < >  --   --   --  0.64 0.16* 0.14* 0.23*  CREATININE 1.78*  --   --   --   --  0.93  --   --  0.73  TROPONINIHS  --   --   --  21* 22*  --   --   --   --    < > = values in this interval not displayed.     Estimated Creatinine Clearance: 59.9 mL/min (by C-G formula based on SCr of 0.73 mg/dL).   Medical History: Past Medical History:  Diagnosis Date   Asthma    Deafness    Depression    Diabetes mellitus without complication (HCC)    Family history of adverse reaction to anesthesia    Mother - PONV   GERD (gastroesophageal reflux disease)    Heart abnormality    per mother pt has bad heart murmor- sees Dr Lady Gary    Hyperlipidemia    Hypertension    Mechanical heart valve present    PONV (postoperative nausea and vomiting)    Presence of permanent cardiac pacemaker    Staring episodes     Medications:  Warfarin PTA- 1 mg + 3 mg (4 mg daily)- see telephone note 09/27/22  , last dose & time unknown,   Assessment: Pt is 62 yo female w/ hx of aortic mechanical valve on warfarin admitted for recurrent falls, PNA. Deaf- uses  sign language.    HL: 6/15 0512 HL 0.64, therapeutic x 1 6/15 1331 HL= 0.16, subtherapeutic, inc 750>900 units/hr 6/15 2104 HL=0.14, subtherapeutic (notified by RN that when patient arrived to unit Heparin was not infusing) 6/16 0413 HL 0.23, subtherapeutic  INR: 6/14 2141 INR 1.7 6/15  INR 1.8 6/16 INR 16  Goal of Therapy:  INR 2.5 -3.5 Heparin level 0.3-0.7 units/ml Monitor platelets by anticoagulation protocol: Yes   Plan:  Heparin Drip:  Bolus 800 units x 1 Increase heparin infusion to 1000 units/hr Recheck HL in 6 hrs after rate change. CBC daily while on heparin  Warfarin: Will order Warfarin 5 mg po x 1 dose  DDI :Azithromycin Will bridge with heparin drip until INR therapeuticx2 INR daily  Otelia Sergeant, PharmD, [facility] 10/07/2022 5:20 AM

## 2022-10-07 NOTE — Progress Notes (Signed)
PROGRESS NOTE    Brianna Harris   WUJ:[id number] DOB: 12/29/1960  DOA: 10/05/2022 Date of Service: 10/07/22 PCP: Gracelyn Nurse, MD     Brief Narrative / Hospital Course:   Brianna Harris is a 62 y.o. 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  Patient was in her usual state of health till about a week ago when patient had a new onset of cough. Started on azithromycin outpatient. Weakness, falls so brought to ED.  06/14: To ED. Labs show AKI (Cr 1.78, baseline 0.8), CXR shows pneumonia.  06/15: hypokalemia 2.6. AKI resolved. Foley placed overnight d/t urinary retention. Bridging w/ heparin to therapeutic coumadin. PT eval pending.  06/16: INR still low, bridging w/ lovenox in anticipation of discharge. PT for [facility]. Can remove foley this evening for void trial. If labs ok and DME delivered by tomorrow, anticipate d/c.   Consultants:  none  Procedures: none      ASSESSMENT & PLAN:   Principal Problem:   Community acquired pneumonia Active Problems:   Deafness   Diabetes mellitus type 2, uncomplicated (HCC)   Junctional bradycardia   S/P AVR (aortic valve replacement)   CAP (community acquired pneumonia)   AKI (acute kidney injury) (HCC)   Falls   Seizure (HCC)   Community acquired pneumonia No associated hypoxia, no sepsis.   ceftriaxone and azithromycin. --> po augmentin + azithro  AKI (acute kidney injury) (HCC) - resolved Setting of of pneumonia, patient not eating and drinking for the last several days.   IV hydration --> po  Monitor BMP  Hypokalemia, severe - improved  Replace as needed Monitor BMP  Urinary retention Chronic  Foley placed by ED, can trial d/c and void on her own If not successful in voiding  independently, can re-place Foley to follow w/ urology outpatient   Falls In the setting of pneumonia most consistent with deconditioning due to not eating and drinking.   PT/OT  S/P AVR (aortic valve replacement) Subtherapeutic INR  Lovenox bridge.   Warfarin as per pharmacy protocol.   Junctional bradycardia, s/p pacemaker placement  On Metoprolol outpatient, unclear why Low BP here, hold metoprolol  Diabetes mellitus type 2, uncomplicated (HCC) This is managed with Trulicity and metformin at home.   A1C  Deafness Chronic issue, mother also advises that patient has chronic memory issues since her stroke.  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   American sign language interpreter  Hx Seizure (HCC) Is a chronic suspected diagnosis.  Neurology note in Care Everywhere describes patient having blank staring spells.  Lamictal level Continue home lamictal      DVT prophylaxis: on heparin bridging to coumadin  Pertinent IV fluids/nutrition: stopped IV fluids w/ AKI resolved Central lines / invasive devices: Foley placed by night RN d/t urinary retention, will d/c today   Code Status: FULL CODE ACP documentation reviewed: 10/06/22 none on file  Current Admission Status: inpatient   TOC needs / Dispo plan: TBD pending PT/OT Barriers to discharge / significant pending items: hypokalemia, bridghing to therapeutic INR, anticipate here 1-2 more days              Subjective / Brief ROS:  ASL interpreter used  Patient reports feeling ok this morning Denies CP/SOB.  Pain controlled.  Denies new weakness.    Family Communication: mother and friend at bedside on rounds     Objective Findings:  Vitals:   10/07/22 0008 10/07/22 0433 10/07/22 0749 10/07/22 1549  BP: 133/60 (!) 146/59 132/69 125/69  Pulse: 82 79 72 75  Resp: 12 20 16 16   Temp: 97.9 F (36.6 C) 97.8 F (36.6 C) 97.6 F (36.4 C) 97.7 F (36.5 C)  TempSrc:      SpO2: 97% 96% 99% 100%  Weight:       Height:        Intake/Output Summary (Last 24 hours) at 10/07/2022 1731 Last data filed at 10/07/2022 1602 Gross per 24 hour  Intake 120 ml  Output 2300 ml  Net -2180 ml    Filed Weights   10/05/22 1620  Weight: 56 kg    Examination:  Physical Exam Constitutional:      General: She is not in acute distress.    Appearance: She is not ill-appearing.  Cardiovascular:     Rate and Rhythm: Normal rate and regular rhythm.     Heart sounds: Normal heart sounds.  Pulmonary:     Effort: Pulmonary effort is normal.     Breath sounds: Normal breath sounds.  Abdominal:     General: Abdomen is flat.     Palpations: Abdomen is soft.  Musculoskeletal:     Right lower leg: No edema.     Left lower leg: No edema.  Skin:    General: Skin is warm and dry.  Neurological:     General: No focal deficit present.     Mental Status: She is alert and oriented to person, place, and time.          Scheduled Medications:   amitriptyline  10 mg Oral QHS   atorvastatin  40 mg Oral QHS   Chlorhexidine Gluconate Cloth  6 each Topical Daily   dextromethorphan-guaiFENesin  1 tablet Oral BID   enoxaparin (LOVENOX) injection  60 mg Subcutaneous Q12H   insulin aspart  0-15 Units Subcutaneous TID WC   insulin aspart  0-5 Units Subcutaneous QHS   lamoTRIgine  100 mg Oral BID   pantoprazole  20 mg Oral Daily   sodium chloride flush  3 mL Intravenous Q12H   Warfarin - Pharmacist Dosing Inpatient   Does not apply q1600    Continuous Infusions:  azithromycin 500 mg (10/06/22 2135)   cefTRIAXone (ROCEPHIN)  IV 1 g (10/07/22 1042)    PRN Medications:  acetaminophen **OR** acetaminophen  Antimicrobials from admission:  Anti-infectives (From admission, onward)    Start     Dose/Rate Route Frequency Ordered Stop   10/08/22 0000  azithromycin (ZITHROMAX) 250 MG tablet        250 mg Oral Daily 10/07/22 1434 10/11/22 2359   10/08/22 0000  amoxicillin-clavulanate (AUGMENTIN) 500-125 MG tablet         1 tablet Oral 2 times daily 10/07/22 1434 10/13/22 2359   10/06/22 2200  azithromycin (ZITHROMAX) 500 mg in sodium chloride 0.9 % 250 mL IVPB        500 mg 250 mL/hr over 60 Minutes Intravenous Every 24 hours 10/05/22 2258 10/08/22 2159   10/06/22 1000  cefTRIAXone (ROCEPHIN) 1 g in sodium chloride 0.9 % 100 mL IVPB        1 g 200 mL/hr over 30 Minutes Intravenous Every 24 hours 10/05/22 2258     10/05/22 2230  cefTRIAXone (ROCEPHIN) 2 g in sodium chloride 0.9 %  100 mL IVPB        2 g 200 mL/hr over 30 Minutes Intravenous  Once 10/05/22 2222 10/05/22 2326   10/05/22 2230  azithromycin (ZITHROMAX) 500 mg in sodium chloride 0.9 % 250 mL IVPB        500 mg 250 mL/hr over 60 Minutes Intravenous  Once 10/05/22 2222 10/06/22 0058           Data Reviewed:  I have personally reviewed the following...  CBC: Recent Labs  Lab 10/05/22 1938 10/06/22 0512 10/07/22 0413  WBC 11.7* 7.7 6.7  NEUTROABS 6.7  --   --   HGB 14.7 12.7 11.7*  HCT 44.0 38.0 35.7*  MCV 91.1 91.6 91.5  PLT 339 254 239    Basic Metabolic Panel: Recent Labs  Lab 10/05/22 1938 10/06/22 0512 10/06/22 1559 10/07/22 0410 10/07/22 0413  NA 136 140  --   --  138  K 3.5 2.6* 3.4*  --  3.1*  CL 98 106  --   --  105  CO2 25 24  --   --  25  GLUCOSE 123* 141*  --   --  120*  BUN 37* 24*  --   --  15  CREATININE 1.78* 0.93  --   --  0.73  CALCIUM 8.9 8.3*  --   --  8.3*  MG  --   --   --  2.0  --     GFR: Estimated Creatinine Clearance: 59.9 mL/min (by C-G formula based on SCr of 0.73 mg/dL). Liver Function Tests: Recent Labs  Lab 10/05/22 1938  AST 30  ALT 23  ALKPHOS 52  BILITOT 1.2  PROT 7.2  ALBUMIN 4.3    No results for input(s): "LIPASE", "AMYLASE" in the last 168 hours. No results for input(s): "AMMONIA" in the last 168 hours. Coagulation Profile: Recent Labs  Lab 10/05/22 2141 10/06/22 0512 10/07/22 0413  INR 1.7* 1.8* 1.6*    Cardiac Enzymes: No results for input(s):  "CKTOTAL", "CKMB", "CKMBINDEX", "TROPONINI" in the last 168 hours. BNP (last 3 results) No results for input(s): "PROBNP" in the last 8760 hours. HbA1C: Recent Labs    10/05/22 1938  HGBA1C 5.5    CBG: Recent Labs  Lab 10/06/22 1705 10/06/22 2056 10/07/22 0754 10/07/22 1241 10/07/22 1550  GLUCAP 119* 131* 83 143* 78    Lipid Profile: No results for input(s): "CHOL", "HDL", "LDLCALC", "TRIG", "CHOLHDL", "LDLDIRECT" in the last 72 hours. Thyroid Function Tests: No results for input(s): "TSH", "T4TOTAL", "FREET4", "T3FREE", "THYROIDAB" in the last 72 hours. Anemia Panel: No results for input(s): "VITAMINB12", "FOLATE", "FERRITIN", "TIBC", "IRON", "RETICCTPCT" in the last 72 hours. Most Recent Urinalysis On File:     Component Value Date/Time   COLORURINE YELLOW (A) 10/06/2022 0032   COLORURINE YELLOW (A) 10/06/2022 0032   APPEARANCEUR CLEAR (A) 10/06/2022 0032   APPEARANCEUR CLEAR (A) 10/06/2022 0032   APPEARANCEUR Clear 08/27/2016 1550   LABSPEC 1.009 10/06/2022 0032   LABSPEC 1.009 10/06/2022 0032   PHURINE 5.0 10/06/2022 0032   PHURINE 5.0 10/06/2022 0032   GLUCOSEU NEGATIVE 10/06/2022 0032   GLUCOSEU NEGATIVE 10/06/2022 0032   HGBUR NEGATIVE 10/06/2022 0032   HGBUR NEGATIVE 10/06/2022 0032   BILIRUBINUR NEGATIVE 10/06/2022 0032   BILIRUBINUR NEGATIVE 10/06/2022 0032   BILIRUBINUR Negative 08/27/2016 1550   KETONESUR NEGATIVE 10/06/2022 0032   KETONESUR NEGATIVE 10/06/2022 0032   PROTEINUR NEGATIVE 10/06/2022 0032   PROTEINUR NEGATIVE 10/06/2022 0032   NITRITE NEGATIVE 10/06/2022 0032  NITRITE NEGATIVE 10/06/2022 0032   LEUKOCYTESUR NEGATIVE 10/06/2022 0032   LEUKOCYTESUR NEGATIVE 10/06/2022 0032   Sepsis Labs: @LABRCNTIP (procalcitonin:4,lacticidven:4) Microbiology: No results found for this or any previous visit (from the past 240 hour(s)).    Radiology Studies last 3 days: CT Renal Stone Study  Result Date: 10/05/2022 CLINICAL DATA:  Acute kidney  injury.  Fall. EXAM: CT ABDOMEN AND PELVIS WITHOUT CONTRAST TECHNIQUE: Multidetector CT imaging of the abdomen and pelvis was performed following the standard protocol without IV contrast. RADIATION DOSE REDUCTION: This exam was performed according to the departmental dose-optimization program which includes automated exposure control, adjustment of the mA and/or kV according to patient size and/or use of iterative reconstruction technique. COMPARISON:  03/21/2022 FINDINGS: Lower chest: Airspace opacity in the right lower lobe concerning for pneumonia. Heart borderline in size. Pacer wires in the right heart. Aortic atherosclerosis. Hepatobiliary: No focal liver abnormality is seen. Status post cholecystectomy. No biliary dilatation. Pancreas: No focal abnormality or ductal dilatation. Spleen: No focal abnormality.  Normal size. Adrenals/Urinary Tract: No adrenal abnormality. No focal renal abnormality. No stones or hydronephrosis. Urinary bladder is unremarkable. Stomach/Bowel: Stomach, large and small bowel grossly unremarkable. Vascular/Lymphatic: Aortic atherosclerosis. No evidence of aneurysm or adenopathy. Reproductive: Prior hysterectomy.  No adnexal masses. Other: No free fluid or free air. Musculoskeletal: No acute bony abnormality. IMPRESSION: Airspace opacity in the right lower lobe paddle with pneumonia. No acute findings in the abdomen or pelvis. Aortic atherosclerosis. Electronically Signed   By: Charlett Nose M.D.   On: 10/05/2022 21:48   DG Sacrum/Coccyx  Result Date: 10/05/2022 CLINICAL DATA:  Trauma, fall EXAM: SACRUM AND COCCYX - 2+ VIEW COMPARISON:  None Available. FINDINGS: No recent displaced fracture is seen. SI joints are unremarkable. Degenerative changes are noted in the visualized lower lumbar spine. IMPRESSION: No recent fracture is seen.  Lumbar spondylosis. Electronically Signed   By: Ernie Avena M.D.   On: 10/05/2022 20:20   DG Lumbar Spine Complete  Result Date:  10/05/2022 CLINICAL DATA:  Trauma, fall, pain EXAM: LUMBAR SPINE - COMPLETE 4+ VIEW COMPARISON:  08/07/2022 FINDINGS: There are 6 non-rib-bearing vertebrae in lumbar spine. This may suggest partial lumbarization of S1 vertebra. No recent fracture is seen. Alignment of posterior margins of vertebral bodies is within normal limits. Degenerative changes are noted with small anterior bony spurs and facet hypertrophy, more so at the L4-L5 and L5-S1 levels. No significant interval changes are noted in bony structures. Arterial calcifications are seen in aorta and its major branches. Surgical clips are seen in gallbladder fossa. IMPRESSION: No recent fracture is seen. Lumbar spondylosis. No significant interval changes are noted since 08/07/2022. Electronically Signed   By: Ernie Avena M.D.   On: 10/05/2022 20:18   DG Chest 2 View  Result Date: 10/05/2022 CLINICAL DATA:  Trauma, fall, fever, cough EXAM: CHEST - 2 VIEW COMPARISON:  03/21/2022 FINDINGS: Transverse diameter of heart is increased. There is prosthetic aortic valve. Pacemaker battery is seen in right infraclavicular region with tips of leads in right atrium and right ventricle. Lung fields are clear of any infiltrates or pulmonary edema. There is no pleural effusion or pneumothorax. There is mild extrinsic pressure over the right lateral margin of Aki in the lower neck, possibly due to enlarged right lobe of thyroid. IMPRESSION: No active cardiopulmonary disease. Electronically Signed   By: Ernie Avena M.D.   On: 10/05/2022 20:13   DG Cervical Spine 2-3 Views  Result Date: 10/05/2022 CLINICAL DATA:  Pain after fall EXAM:  CERVICAL SPINE - 3 VIEW COMPARISON:  None Available. FINDINGS: Preserved vertebral body heights, alignment and prevertebral soft tissues. There is multilevel disc height loss with endplate osteophytes in particular from C4 through C7. No listhesis. Prominent facet degenerative changes seen particularly left mid cervical  spine. Recommend continue precautions until clinical clearance and if there is further concern of injury recommend further workup with CT for higher sensitivity as a significant portion of acute cervical spine injuries are x-ray occult. Note is made of sternal wires at the edge of the imaging field with battery pack along the right upper chest. Osteopenia IMPRESSION: Osteopenia with moderate degenerative changes of the mid to lower cervical spine greatest at C6-7. Postop chest.  Pacemaker. Electronically Signed   By: Karen Kays M.D.   On: 10/05/2022 17:17   CT Head Wo Contrast  Result Date: 10/05/2022 CLINICAL DATA:  Head trauma, minor, normal mental status (Age 44-64y). Multiple falls. EXAM: CT HEAD WITHOUT CONTRAST TECHNIQUE: Contiguous axial images were obtained from the base of the skull through the vertex without intravenous contrast. RADIATION DOSE REDUCTION: This exam was performed according to the departmental dose-optimization program which includes automated exposure control, adjustment of the mA and/or kV according to patient size and/or use of iterative reconstruction technique. COMPARISON:  Head CT 08/07/2022 FINDINGS: Brain: There is no evidence of an acute infarct, intracranial hemorrhage, mass, midline shift, or extra-axial fluid collection. Head a moderate-sized chronic infarct is again noted in the left frontal lobe. Patchy hypodensities elsewhere in the cerebral white matter bilaterally are unchanged and nonspecific but compatible with mild-to-moderate chronic small vessel ischemic disease. A chronic infarct is again noted inferiorly in the left basal ganglia, and there is also underlying chronic bilateral caudate nucleus volume loss. Vascular: Calcified atherosclerosis at the skull base. Skull: No fracture or suspicious osseous lesion. Sinuses/Orbits: Opacification of a single posterior right ethmoid air cell. Clear mastoid air cells. Left cataract extraction. Other: None. IMPRESSION: 1. No  evidence of acute intracranial abnormality. 2. Chronic ischemia with multiple old infarcts as above. Electronically Signed   By: Sebastian Ache M.D.   On: 10/05/2022 17:03             LOS: 2 days    Time spent: 50 min    Sunnie Nielsen, DO [facility] 10/07/2022, 5:31 PM    Dictation software may have been used to generate the above note. Typos may occur and escape review in typed/dictated notes. Please contact Dr Lyn Hollingshead directly for clarity if needed.  Staff may message me via secure chat in Epic  but this may not receive an immediate response,  please page me for urgent matters!  If 7PM-7AM, please contact night coverage [email or web address]

## 2022-10-07 NOTE — Progress Notes (Signed)
Patient is not able to walk the distance required to go the bathroom, or he/she is unable to safely negotiate stairs required to access the bathroom.  A 3in1 BSC will alleviate this problem  Brianna Bacci Holcomb, LCSW, MSW, MHA [phone]  

## 2022-10-07 NOTE — TOC Initial Note (Signed)
Transition of Care [facility]) - Initial/Assessment Note    Patient Details  Name: Brianna Harris MRN: [id number] Date of Birth: [birth year]  Transition of Care [facility]) CM/SW Contact:    Darolyn Rua, LCSW Phone Number: 10/07/2022, 1:12 PM  Clinical Narrative:                  CSW spoke with patient's mother regarding home health recommendations, reports they are in agreement with no preference of agency. Reports needing 3in1, tub bench and RW shipped to patients home, address correct in chart. Patient's mother reports she will be taking patient to her own home for 1-2 days after discharge to ensure she is okay prior to patient going to her personal home at address listed.   Reports no additional needs at this time.   Amy with [facility] has accepted patient for [facility] PT OT and RN services.   DME 3in1, RW, tub bench has been ordered via J[facility] with [employer] to be shipped to patients home.    Expected Discharge Plan: Home w Home Health Services Barriers to Discharge: Continued Medical Work up   Patient Goals and CMS Choice Patient states their goals for this hospitalization and ongoing recovery are:: to go home CMS Medicare.gov Compare Post Acute Care list provided to:: Patient Choice offered to / list presented to : Patient      Expected Discharge Plan and Services     Post Acute Care Choice: Home Health Living arrangements for the past 2 months: Single Family Home                 DME Arranged: 3-N-1, Walker rolling, Tub bench DME Agency: [employer] Date DME Agency Contacted: 10/07/22   Representative spoke with at DME Agency: Leavy Cella HH Arranged: PT, OT, Nurse's Aide HH Agency: [facility] Date [facility] Agency Contacted: 10/07/22 Time HH Agency Contacted: 1312 Representative spoke with at [facility] Agency: Amy  Prior Living Arrangements/Services Living arrangements for the past 2 months: Single Family Home Lives with:: Parents   Do you feel safe going back to the place where  you live?: Yes               Activities of Daily Living Home Assistive Devices/Equipment: None ADL Screening (condition at time of admission) Patient's cognitive ability adequate to safely complete daily activities?: Yes Is the patient deaf or have difficulty hearing?: Yes Does the patient have difficulty seeing, even when wearing glasses/contacts?: No Does the patient have difficulty concentrating, remembering, or making decisions?: No Patient able to express need for assistance with ADLs?: No Does the patient have difficulty dressing or bathing?: No Independently performs ADLs?: Yes (appropriate for developmental age) Does the patient have difficulty walking or climbing stairs?: No Weakness of Legs: Right Weakness of Arms/Hands: Right  Permission Sought/Granted                  Emotional Assessment              Admission diagnosis:  Weakness [R53.1] Community acquired pneumonia [J18.9] AKI (acute kidney injury) (HCC) [N17.9] Community acquired pneumonia of right lower lobe of lung [J18.9] Patient Active Problem List   Diagnosis Date Noted   CAP (community acquired pneumonia) 10/05/2022   Community acquired pneumonia 10/05/2022   AKI (acute kidney injury) (HCC) 10/05/2022   Falls 10/05/2022   Seizure (HCC) 10/05/2022   AVM (arteriovenous malformation) of colon without hemorrhage    Iron deficiency anemia 12/24/2019   Fatty liver 12/11/2019   Aortic  atherosclerosis (HCC) 03/24/2019   Cardiac pacemaker in situ 06/09/2018   Post-surgical complete heart block (HCC) 06/09/2018   Decreased activities of daily living (ADL) 02/17/2018   H/O partial seizures 02/17/2018   Junctional bradycardia 02/17/2018   Anticoagulated on Coumadin 02/16/2018   Oliguria 02/16/2018   Red blood cell antibody positive, compatible PRBC difficult to obtain 02/14/2018   S/P AVR (aortic valve replacement) 02/14/2018   History of cocaine abuse (HCC) 01/20/2018   On statin therapy  01/20/2018   Overactive bladder 08/27/2016   Hyperlipidemia, unspecified 08/27/2016   Drug abuse (HCC) 08/27/2016   Aortic valve stenosis 08/03/2016   Asthma without status asthmaticus 07/19/2016   Diabetes mellitus type 2, uncomplicated (HCC) 07/19/2016   Elevated LFTs 05/21/2016   Colon cancer screening 05/21/2016   Alcoholism /alcohol abuse 05/21/2016   Lipid disorder 09/16/2011   Chronic depression 09/12/2011   Tobacco abuse 09/12/2011   OAB (overactive bladder) 09/12/2011   Crack cocaine use 09/12/2011   Back pain 09/12/2011   Asthma, moderate persistent 09/12/2011   Deafness 05/01/2011   Hypertension 05/01/2011   GERD (gastroesophageal reflux disease) 05/01/2011   PCP:  Gracelyn Nurse, MD Pharmacy:   [facility] [address] [place] - [address] [place] [address] Phone: [phone] Fax: [phone]     Social Determinants of Health (SDOH) Social History: SDOH Screenings   Food Insecurity: No Food Insecurity (10/07/2022)  Housing: Low Risk  (10/07/2022)  Transportation Needs: Unmet Transportation Needs (10/07/2022)  Utilities: Not At Risk (10/07/2022)  Depression (PHQ2-9): Low Risk  (02/13/2022)  Tobacco Use: Medium Risk (10/05/2022)   SDOH Interventions:     Readmission Risk Interventions     No data to display

## 2022-10-07 NOTE — Progress Notes (Signed)
ANTICOAGULATION CONSULT NOTE  Pharmacy Consult for Warfarin with Heparin Bridge Indication: Aortic Mechanical Valve  No Known Allergies  Patient Measurements: Height: 5\' 2"  (157.5 cm) Weight: 56 kg (123 lb 7.3 oz) IBW/kg (Calculated) : 50.1 Heparin Dosing Weight: 56 kg  Vital Signs: Temp: 97.6 F (36.4 C) (06/16 0749) BP: 132/69 (06/16 0749) Pulse Rate: 72 (06/16 0749)  Labs: Recent Labs    10/05/22 1938 10/05/22 1938 10/05/22 2141 10/05/22 2231 10/06/22 0238 10/06/22 0512 10/06/22 1331 10/06/22 2104 10/07/22 0413  HGB 14.7  --   --   --   --  12.7  --   --  11.7*  HCT 44.0  --   --   --   --  38.0  --   --  35.7*  PLT 339  --   --   --   --  254  --   --  239  APTT  --   --  39*  --   --   --   --   --   --   LABPROT  --   --  20.3*  --   --  20.7*  --   --  19.3*  INR  --   --  1.7*  --   --  1.8*  --   --  1.6*  HEPARINUNFRC  --    < >  --   --   --  0.64 0.16* 0.14* 0.23*  CREATININE 1.78*  --   --   --   --  0.93  --   --  0.73  TROPONINIHS  --   --   --  21* 22*  --   --   --   --    < > = values in this interval not displayed.     Estimated Creatinine Clearance: 59.9 mL/min (by C-G formula based on SCr of 0.73 mg/dL).   Medical History: Past Medical History:  Diagnosis Date   Asthma    Deafness    Depression    Diabetes mellitus without complication (HCC)    Family history of adverse reaction to anesthesia    Mother - PONV   GERD (gastroesophageal reflux disease)    Heart abnormality    per mother pt has bad heart murmor- sees Dr Lady Gary    Hyperlipidemia    Hypertension    Mechanical heart valve present    PONV (postoperative nausea and vomiting)    Presence of permanent cardiac pacemaker    Staring episodes     Medications:  Warfarin PTA- 1 mg + 3 mg (4 mg daily)- see telephone note 09/27/22  , last dose & time unknown,   Assessment: Pt is 62 yo female w/ hx of aortic mechanical valve on warfarin admitted for recurrent falls, PNA. Deaf- uses  sign language.   HL: 6/15 0512 HL 0.64, therapeutic x 1 6/15 1331 HL= 0.16, subtherapeutic, inc 750>900 units/hr 6/15 2104 HL=0.14, subtherapeutic (notified by RN that when patient arrived to unit Heparin was not infusing) 6/16 0413 HL 0.23, subtherapeutic   INR: 6/14 2141 INR 1.7 6/15  INR 1.8 Warfarin 5 mg 6/16 INR 1.6  Goal of Therapy:  INR 2.5 -3.5 Heparin level 0.3-0.7 units/ml Monitor platelets by anticoagulation protocol: Yes   Plan:  Heparin Drip:  Bolus 800 units x 1 Increase heparin infusion to 1000 units/hr Recheck HL in 6 hrs after rate change. CBC daily while on heparin  Warfarin: INR subtherapeutic. Will order Warfarin 7.5  mg po x 1 dose DDI :Azithromycin Will bridge with heparin drip until INR therapeutic x2 INR daily  Bari Mantis PharmD Clinical Pharmacist 10/07/2022

## 2022-10-07 NOTE — Progress Notes (Signed)
Physical Therapy Treatment Patient Details Name: JANETT HAMEISTER MRN: [id number] DOB: [birth year] Today's Date: 10/07/2022   History of Present Illness Pt is a 62 y.o. female presenting to hospital 10/05/22 s/p multiple falls and generalized weakness.  Pt admitted with community acquired PNA, seizure, falls, AKI.  PMH includes hearing impairment, asthma, htn, DM, HLD, heart valve on Coumadin, cardiac pacemaker in situ, h/o CVA.  Per chart pt has chronic memory issues since her stroke (per pt's mother); deafness present since birth.    PT Comments    Seen with OT.  PT treat, OT eval overlap for equipment as interpreter on ipad and IV required +2 assist.   She is able to get out of bed with encouragement and supervision.  She is given a RW and is able to walk 120' with supervision.  Generally slow but steady gait.  Mom and friend in for session.  She lives in group home with other hearing impaired adults and has good community support but limited physical assist.  Mom prefers she return to group home.  She does not have and will benefit from a RW upon discharge for general safety.  Will update discharge recommendations.  She will benefit from continued therapy at discharge and increased at home supports as available to her.   Recommendations for follow up therapy are one component of a multi-disciplinary discharge planning process, led by the attending physician.  Recommendations may be updated based on patient status, additional functional criteria and insurance authorization.  Follow Up Recommendations  Can patient physically be transported by private vehicle: Yes    Assistance Recommended at Discharge Intermittent Supervision/Assistance  Patient can return home with the following A little help with walking and/or transfers;A little help with bathing/dressing/bathroom;Assistance with cooking/housework;Assist for transportation;Help with stairs or ramp for entrance   Equipment Recommendations   Rolling walker (2 wheels) (TBD)    Recommendations for Other Services       Precautions / Restrictions Precautions Precautions: Fall Restrictions Weight Bearing Restrictions: No     Mobility  Bed Mobility Overal bed mobility: Needs Assistance Bed Mobility: Supine to Sit     Supine to sit: Modified independent (Device/Increase time), Supervision          Transfers Overall transfer level: Needs assistance Equipment used: None   Sit to Stand: Supervision           General transfer comment: stands with supervision with no AD    Ambulation/Gait Ambulation/Gait assistance: Supervision Gait Distance (Feet): 120 Feet Assistive device: Rolling walker (2 wheels) Gait Pattern/deviations: Step-through pattern, Decreased step length - right, Decreased step length - left Gait velocity: slow but steaday gait         Stairs             Wheelchair Mobility    Modified Rankin (Stroke Patients Only)       Balance Overall balance assessment: Needs assistance Sitting-balance support: No upper extremity supported, Feet supported Sitting balance-Leahy Scale: Good     Standing balance support: Bilateral upper extremity supported Standing balance-Leahy Scale: Fair                              Cognition Arousal/Alertness: Awake/alert Behavior During Therapy: WFL for tasks assessed/performed Overall Cognitive Status: History of cognitive impairments - at baseline  Exercises      General Comments        Pertinent Vitals/Pain Pain Assessment Pain Assessment: No/denies pain Pain Intervention(s): Monitored during session    Home Living Family/patient expects to be discharged to:: Private residence Living Arrangements: Non-relatives/Friends (roommate) Available Help at Discharge: Family;Available PRN/intermittently (mother can assist, but also does not live in [place] but a little  ways away) Type of Home: House Home Access: Stairs to enter Entrance Stairs-Rails: Left Entrance Stairs-Number of Steps: 2   Home Layout: Two level;Able to live on main level with bedroom/bathroom Home Equipment: Cane - single point;Grab bars - tub/shower Additional Comments: Mother expresses concerns about safety with transfers in/out of tub    Prior Function            PT Goals (current goals can now be found in the care plan section) Progress towards PT goals: Progressing toward goals    Frequency    Min 4X/week      PT Plan      Co-evaluation   Reason for Co-Treatment: For patient/therapist safety   OT goals addressed during session: ADL's and self-care      AM-PAC PT "6 Clicks" Mobility   Outcome Measure  Help needed turning from your back to your side while in a flat bed without using bedrails?: None Help needed moving from lying on your back to sitting on the side of a flat bed without using bedrails?: None Help needed moving to and from a bed to a chair (including a wheelchair)?: None Help needed standing up from a chair using your arms (e.g., wheelchair or bedside chair)?: A Little Help needed to walk in hospital room?: A Little Help needed climbing 3-5 steps with a railing? : A Little 6 Click Score: 21    End of Session Equipment Utilized During Treatment: Gait belt Activity Tolerance: Patient tolerated treatment well Patient left:  (nurse present assisting pt to recliner end of PT session; nurse to set pt up with pt needs) Nurse Communication: Mobility status;Precautions PT Visit Diagnosis: Unsteadiness on feet (R26.81);Muscle weakness (generalized) (M62.81);History of falling (Z91.81);Repeated falls (R29.6);Other abnormalities of gait and mobility (R26.89);Pain Pain - part of body:  (low back)     Time: 3244-0102 PT Time Calculation (min) (ACUTE ONLY): 24 min  Charges:  $Gait Training: 8-22 mins                   Danielle Dess, PTA 10/07/22,  1:05 PM

## 2022-10-07 NOTE — Progress Notes (Signed)
ANTICOAGULATION CONSULT NOTE  Pharmacy Consult for Warfarin with Heparin Bridge Indication: Aortic Mechanical Valve  No Known Allergies  Patient Measurements: Height: 5\' 2"  (157.5 cm) Weight: 56 kg (123 lb 7.3 oz) IBW/kg (Calculated) : 50.1 Heparin Dosing Weight: 56 kg  Vital Signs: Temp: 97.6 F (36.4 C) (06/16 0749) BP: 132/69 (06/16 0749) Pulse Rate: 72 (06/16 0749)  Labs: Recent Labs    10/05/22 1938 10/05/22 2141 10/05/22 2231 10/06/22 0238 10/06/22 0512 10/06/22 1331 10/06/22 2104 10/07/22 0413 10/07/22 1131  HGB 14.7  --   --   --  12.7  --   --  11.7*  --   HCT 44.0  --   --   --  38.0  --   --  35.7*  --   PLT 339  --   --   --  254  --   --  239  --   APTT  --  39*  --   --   --   --   --   --   --   LABPROT  --  20.3*  --   --  20.7*  --   --  19.3*  --   INR  --  1.7*  --   --  1.8*  --   --  1.6*  --   HEPARINUNFRC  --   --   --   --  0.64   < > 0.14* 0.23* 0.48  CREATININE 1.78*  --   --   --  0.93  --   --  0.73  --   TROPONINIHS  --   --  21* 22*  --   --   --   --   --    < > = values in this interval not displayed.     Estimated Creatinine Clearance: 59.9 mL/min (by C-G formula based on SCr of 0.73 mg/dL).   Medical History: Past Medical History:  Diagnosis Date   Asthma    Deafness    Depression    Diabetes mellitus without complication (HCC)    Family history of adverse reaction to anesthesia    Mother - PONV   GERD (gastroesophageal reflux disease)    Heart abnormality    per mother pt has bad heart murmor- sees Dr Lady Gary    Hyperlipidemia    Hypertension    Mechanical heart valve present    PONV (postoperative nausea and vomiting)    Presence of permanent cardiac pacemaker    Staring episodes     Medications:  Warfarin PTA- 1 mg + 3 mg (4 mg daily)- see telephone note 09/27/22  , last dose & time unknown,   Assessment: Pt is 62 yo female w/ hx of aortic mechanical valve on warfarin admitted for recurrent falls, PNA. Deaf- uses  sign language.   HL: 6/15 0512 HL 0.64, therapeutic x 1 6/15 1331 HL= 0.16, subtherapeutic, inc 750>900 units/hr 6/15 2104 HL=0.14, subtherapeutic (notified by RN that when patient arrived to unit Heparin was not infusing) 6/16 0413 HL 0.23, subtherapeutic 6/16 1131 HL 0.48  therapeutic x 1   INR: 6/14 2141 INR 1.7 6/15  INR 1.8 Warfarin 5 mg 6/16 INR 1.6  Goal of Therapy:  INR 2.5 -3.5 Heparin level 0.3-0.7 units/ml Monitor platelets by anticoagulation protocol: Yes   Plan:  Heparin Drip:  6/16 1131 HL 0.48  therapeutic x 1 Continue heparin infusion at 1000 units/hr Check confirmatory HL in 6 hrs. CBC daily while on  heparin  Warfarin: INR subtherapeutic. Will order Warfarin 7.5 mg po x 1 dose DDI :Azithromycin Will bridge with heparin drip until INR therapeutic x2 INR daily  Bari Mantis PharmD Clinical Pharmacist 10/07/2022

## 2022-10-08 ENCOUNTER — Telehealth: Payer: Self-pay | Admitting: *Deleted

## 2022-10-08 ENCOUNTER — Other Ambulatory Visit: Payer: Self-pay | Admitting: *Deleted

## 2022-10-08 DIAGNOSIS — J189 Pneumonia, unspecified organism: Secondary | ICD-10-CM | POA: Diagnosis not present

## 2022-10-08 LAB — BASIC METABOLIC PANEL
Anion gap: 9 (ref 5–15)
BUN: 11 mg/dL (ref 6–20)
CO2: 26 mmol/L (ref 22–32)
Calcium: 8.9 mg/dL (ref 8.9–10.3)
Chloride: 105 mmol/L (ref 98–111)
Creatinine, Ser: 0.67 mg/dL (ref 0.44–1.00)
GFR, Estimated: >60 mL/min (ref >60)
Glucose, Bld: 103 mg/dL — ABNORMAL HIGH (ref 70–99)
Potassium: 3.9 mmol/L (ref 3.5–5.1)
Sodium: 140 mmol/L (ref 135–145)

## 2022-10-08 LAB — PROTIME-INR
INR: 2.3 — ABNORMAL HIGH (ref 0.8–1.2)
Prothrombin Time: 25.6 seconds — ABNORMAL HIGH (ref 11.4–15.2)

## 2022-10-08 LAB — CBC
HCT: 38.6 % (ref 36.0–46.0)
Hemoglobin: 12.5 g/dL (ref 12.0–15.0)
MCH: 29.8 pg (ref 26.0–34.0)
MCHC: 32.4 g/dL (ref 30.0–36.0)
MCV: 92.1 fL (ref 80.0–100.0)
Platelets: 298 10*3/uL (ref 150–400)
RBC: 4.19 MIL/uL (ref 3.87–5.11)
RDW: 12.6 % (ref 11.5–15.5)
WBC: 6.7 10*3/uL (ref 4.0–10.5)
nRBC: 0 % (ref 0.0–0.2)

## 2022-10-08 LAB — GLUCOSE, CAPILLARY
Glucose-Capillary: 84 mg/dL (ref 70–99)
Glucose-Capillary: 90 mg/dL (ref 70–99)
Glucose-Capillary: 67 mg/dL — ABNORMAL LOW (ref 70–99)
Glucose-Capillary: 128 mg/dL — ABNORMAL HIGH (ref 70–99)

## 2022-10-08 LAB — LAMOTRIGINE LEVEL: Lamotrigine Lvl: 6.7 ug/mL (ref 2.0–20.0)

## 2022-10-08 MED ORDER — HYDROCODONE BIT-HOMATROP MBR 5-1.5 MG/5ML PO SOLN: 5.0000 mL | Freq: Three times a day (TID) | ORAL | 0 refills | Status: DC | PRN

## 2022-10-08 NOTE — TOC Transition Note (Signed)
Transition of Care [facility]) - CM/SW Discharge Note   Patient Details  Name: Brianna Harris MRN: [id number] Date of Birth: January 28, 1961  Transition of Care [facility]) CM/SW Contact:  Allena Katz, LCSW Phone Number: 10/08/2022, 12:16 PM   Clinical Narrative:   Pt has orders to discharge home with [facility] PT/OT/RN. 3in1 ordered to be delivered to patients home via [employer]. CSW signing off.     Final next level of care: Home w Home Health Services Barriers to Discharge: Barriers Resolved   Patient Goals and CMS Choice CMS Medicare.gov Compare Post Acute Care list provided to:: Patient Represenative (must comment) (mildred) Choice offered to / list presented to : Patient  Discharge Placement                         Discharge Plan and Services Additional resources added to the After Visit Summary for       Post Acute Care Choice: Home Health          DME Arranged:  (3in1) DME Agency: [employer] Date DME Agency Contacted: 10/07/22   Representative spoke with at DME Agency: Leavy Cella HH Arranged: PT, OT, RN [facility] Agency: [facility] Date [facility] Agency Contacted: 10/07/22 Time HH Agency Contacted: 1312 Representative spoke with at [facility] Agency: Amy  Social Determinants of Health (SDOH) Interventions SDOH Screenings   Food Insecurity: No Food Insecurity (10/07/2022)  Housing: Low Risk  (10/07/2022)  Transportation Needs: Unmet Transportation Needs (10/07/2022)  Utilities: Not At Risk (10/07/2022)  Depression (PHQ2-9): Low Risk  (02/13/2022)  Tobacco Use: Medium Risk (10/05/2022)     Readmission Risk Interventions     No data to display

## 2022-10-08 NOTE — Progress Notes (Signed)
ANTICOAGULATION CONSULT NOTE  Pharmacy Consult for Warfarin with Enoxaparin Bridge Indication: Aortic Mechanical Valve  No Known Allergies  Patient Measurements: Height: 5\' 2"  (157.5 cm) Weight: 56 kg (123 lb 7.3 oz) IBW/kg (Calculated) : 50.1  Vital Signs: Temp: 97.7 F (36.5 C) (06/17 0510) Temp Source: Oral (06/17 0510) BP: 135/58 (06/17 0510) Pulse Rate: 72 (06/17 0510)  Labs: Recent Labs    10/05/22 1938 10/05/22 2141 10/05/22 2231 10/06/22 0238 10/06/22 0512 10/06/22 1331 10/06/22 2104 10/07/22 0413 10/07/22 1131  HGB 14.7  --   --   --  12.7  --   --  11.7*  --   HCT 44.0  --   --   --  38.0  --   --  35.7*  --   PLT 339  --   --   --  254  --   --  239  --   APTT  --  39*  --   --   --   --   --   --   --   LABPROT  --  20.3*  --   --  20.7*  --   --  19.3*  --   INR  --  1.7*  --   --  1.8*  --   --  1.6*  --   HEPARINUNFRC  --   --   --   --  0.64   < > 0.14* 0.23* 0.48  CREATININE 1.78*  --   --   --  0.93  --   --  0.73  --   TROPONINIHS  --   --  21* 22*  --   --   --   --   --    < > = values in this interval not displayed.     Estimated Creatinine Clearance: 59.9 mL/min (by C-G formula based on SCr of 0.73 mg/dL).   Medical History: Past Medical History:  Diagnosis Date   Asthma    Deafness    Depression    Diabetes mellitus without complication (HCC)    Family history of adverse reaction to anesthesia    Mother - PONV   GERD (gastroesophageal reflux disease)    Heart abnormality    per mother pt has bad heart murmor- sees Dr Lady Gary    Hyperlipidemia    Hypertension    Mechanical heart valve present    PONV (postoperative nausea and vomiting)    Presence of permanent cardiac pacemaker    Staring episodes     Medications:  Warfarin PTA- 1 mg + 3 mg (4 mg daily)- see telephone note 09/27/22  , last dose & time unknown,   Assessment: Pt is 62 yo female w/ hx of aortic mechanical valve on warfarin admitted for recurrent falls, PNA. Deaf-  uses sign language. Currently on Ceftriaxone 1 g Q24h and finished 3 days of azithromycin. Patient was switched from heparin bridge to lovenox bridge in anticipation of discharge  Lovenox: 1 mg/kg SubQ every 12h dosing for bridge 56 kg = 60 mg SubQ every 12h dosing  INR: 6/14 2141 INR 1.7 6/15  INR 1.8 Warfarin 5 mg 6/16 INR 1.6 Warfarin 7.5 mg 6/17 INR 2.3 Warfarin 5 mg  Goal of Therapy:  INR 2.5 -3.5   Plan:  Lovenox:  Continue lovenox 60 mg SubQ every 12 hours CBC daily while on lovenox Continue lovenox until INR therapeutic x 2  Warfarin: INR subtherapeutic. Will order Warfarin 5 mg po x  1 dose [id number] Will bridge with enoxaparin until INR therapeutic x2 INR daily Upon discharge return to home regimen of warfarin Check INR outpatient later this week  Francetta Found, PharmD Candidate Class of 2025  10/08/2022

## 2022-10-08 NOTE — Discharge Summary (Signed)
Physician Discharge Summary   Patient: Brianna Harris MRN: [id number]  DOB: 27-May-1960   Admit:     Date of Admission: 10/05/2022 Admitted from: home   Discharge: Date of discharge: 10/08/22 Disposition: Home health Condition at discharge: good  CODE STATUS: FULL CODE     Discharge Physician: Sunnie Nielsen, DO [facility]     PCP: Gracelyn Nurse, MD  Recommendations for Outpatient Follow-up:  Follow up with PCP Gracelyn Nurse, MD in 1-2 weeks Please obtain labs/tests: CBC, BMP in 1 week to monitor K  Urology office to call re: appt for follow-up Mother is in communication w/ cardiology office for INR f/u - advised needs INR check by end of this week  Please follow up on the following pending results: lamictal level   PCP AND OTHER OUTPATIENT PROVIDERS: SEE BELOW FOR SPECIFIC DISCHARGE INSTRUCTIONS PRINTED FOR PATIENT IN ADDITION TO GENERIC AVS PATIENT INFO     Discharge Instructions     Ambulatory Referral for Lung Cancer Scre   Complete by: As directed    Diet - low sodium heart healthy   Complete by: As directed    Diet Carb Modified   Complete by: As directed    Discharge instructions   Complete by: As directed    CONTINUE THE LOVENOX SHOTS TWICE DAILY UNTIL THE CARDIOLOGY OFFICE TELLS YOU TO CHANGE IT, ONCE THE INR IS THERAPEUTIC  FOLLOW WITH CARDIOLOGY OFFICE AS DIRECTED FOR INR CHECKS   FOLLOW UP WITH THE UROLOGY OFFICE - THEY MIGHT CALL YOU TO MOVE THE APPOINTMENT, BUT OTHERWISE KEEP WHAT YOU HAVE SCHEDULED   THE PNEUMONIA MIGHT TAKE SOME TIME TO CLEAR UP. COUGH IS NORMAL. COMPLETE ALL ANTIBIOTICS AND USE COUGH MEDICINE AS NEEDED.   Increase activity slowly   Complete by: As directed    Increase activity slowly   Complete by: As directed          Discharge Diagnoses: Principal Problem:   Community acquired pneumonia Active Problems:   Deafness   Diabetes mellitus type 2, uncomplicated (HCC)   Junctional bradycardia   S/P  AVR (aortic valve replacement)   CAP (community acquired pneumonia)   AKI (acute kidney injury) (HCC)   Falls   Seizure Brianna Harris)       Hospital Course:  ARLINE [name] is a 62 y.o. 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  Patient was in her usual state of health till about a week ago when patient had a new onset of cough. Started on azithromycin outpatient. Weakness, falls so brought to ED.  06/14: To ED. Labs show AKI (Cr 1.78, baseline 0.8), CXR shows pneumonia.  06/15: hypokalemia 2.6. AKI resolved. Foley placed overnight d/t urinary retention. Bridging w/ heparin to therapeutic coumadin. PT eval pending.  06/16: INR still low, bridging w/ lovenox in anticipation of discharge. PT for [facility]. Can remove foley this evening for void trial. If labs ok and DME delivered by tomorrow, anticipate d/c.   Consultants:  none  Procedures: none      ASSESSMENT & PLAN:   Community acquired pneumonia No associated hypoxia, no sepsis.   ceftriaxone and azithromycin. --> po augmentin + azithro  AKI (acute kidney injury) (HCC) - resolved Setting of of pneumonia, patient  not eating and drinking for the last several days.   IV hydration --> po  Monitor BMP outpatient   Hypokalemia, severe - improved  Replace as needed Monitor BMP outpatient   Urinary retention Chronic  Foley placed by ED, successful d/c and void on her own Urology follow-up Tapering down elavil   Falls In the setting of pneumonia most consistent with deconditioning due to not eating and drinking.   PT/OT --> home health   S/P AVR (aortic valve replacement) Subtherapeutic INR  Lovenox bridge.   Warfarin as per pharmacy protocol. Discharged on 4 mg daily  INR check by  end of this week   Junctional bradycardia, s/p pacemaker placement  On Metoprolol outpatient, unclear why Low BP here, hold metoprolol  Diabetes mellitus type 2, uncomplicated (HCC) This is managed with Trulicity and metformin at home.   A1C  Deafness Chronic issue, mother also advises that patient has chronic memory issues since her stroke.  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   American sign language interpreter  Hx Seizure (HCC) Is a chronic suspected diagnosis.  Neurology note in Care Everywhere describes patient having blank staring spells.  Lamictal level Continue home lamictal            Discharge Instructions  Allergies as of 10/08/2022   No Known Allergies      Medication List     STOP taking these medications    hydrochlorothiazide 12.5 MG tablet Commonly known as: HYDRODIURIL   metoprolol tartrate 25 MG tablet Commonly known as: LOPRESSOR       TAKE these medications    acetaminophen 500 MG tablet Commonly known as: TYLENOL Take 1,000 mg by mouth every 6 (six) hours as needed for moderate pain.   amitriptyline 25 MG tablet Commonly known as: ELAVIL Take 25 mg by mouth at bedtime.   amoxicillin-clavulanate 500-125 MG tablet Commonly known as: AUGMENTIN Take 1 tablet by mouth 2 (two) times daily for 5 days.   atorvastatin 40 MG tablet Commonly known as: LIPITOR Take 40 mg by mouth at bedtime.   azithromycin 250 MG tablet Commonly known as: Zithromax Take 1 tablet (250 mg total) by mouth daily for 3 days.   dextromethorphan-guaiFENesin 30-600 MG 12hr tablet Commonly known as: MUCINEX DM Take 1 tablet by mouth 2 (two) times daily as needed for cough.   Dulaglutide 0.75 MG/0.5ML Sopn Inject 0.75 mg into the skin once a week.   enoxaparin 60 MG/0.6ML injection Commonly known as: LOVENOX Inject 0.6 mLs (60 mg total) into the skin every 12 (twelve) hours. Until directed otherwise based on INR   HYDROcodone bit-homatropine 5-1.5  MG/5ML syrup Commonly known as: HYCODAN Take 5 mLs by mouth every 8 (eight) hours as needed for cough.   lamoTRIgine 100 MG tablet Commonly known as: LAMICTAL Take 100 mg by mouth 2 (two) times daily.   metFORMIN 500 MG 24 hr tablet Commonly known as: GLUCOPHAGE-XR Take 1,000 mg by mouth 2 (two) times daily.   omeprazole 40 MG capsule Commonly known as: PRILOSEC Take 40 mg by mouth daily.   potassium chloride SA 20 MEQ tablet Commonly known as: KLOR-CON M Take 1 tablet (20 mEq total) by mouth 2 (two) times daily for 5 days.   warfarin 3 MG tablet Commonly known as: COUMADIN Take 3 mg by mouth daily. (Take with 1mg  tablet to equal 4mg  total)   warfarin 1 MG tablet Commonly known as: COUMADIN Take 1 mg by mouth daily. (Take with 3mg  tablet to equal  4mg  total)               Durable Medical Equipment  (From admission, onward)           Start     Ordered   10/07/22 1317  For home use only DME Tub bench  Once        10/07/22 1316   10/07/22 1309  For home use only DME 3 n 1  Once        10/07/22 1308   10/07/22 1308  For home use only DME Walker rolling  Once       Question Answer Comment  Walker: With 5 Inch Wheels   Patient needs a walker to treat with the following condition Weakness generalized      10/07/22 1307              No Known Allergies   Subjective: ASL interpreter utilized. Pt feeling well this morning, good appetite, (+)cough but no SOB   Discharge Exam: BP 138/71 (BP Location: Right Arm)   Pulse 79   Temp 97.9 F (36.6 C) (Oral)   Resp 18   Ht 5\' 2"  (1.575 m)   Wt 56 kg   SpO2 100%   BMI 22.58 kg/m  General: Pt is alert, awake, not in acute distress Cardiovascular: RRR, S1/S2 +, no rubs, no gallops Respiratory: CTA bilaterally, no wheezing, no rhonchi Abdominal: Soft, NT, ND, bowel sounds + Extremities: no edema, no cyanosis     The results of significant diagnostics from this hospitalization (including imaging,  microbiology, ancillary and laboratory) are listed below for reference.     Microbiology: No results found for this or any previous visit (from the past 240 hour(s)).   Labs: BNP (last 3 results) No results for input(s): "BNP" in the last 8760 hours. Basic Metabolic Panel: Recent Labs  Lab 10/05/22 1938 10/06/22 0512 10/06/22 1559 10/07/22 0410 10/07/22 0413 10/08/22 0618  NA 136 140  --   --  138 140  K 3.5 2.6* 3.4*  --  3.1* 3.9  CL 98 106  --   --  105 105  CO2 25 24  --   --  25 26  GLUCOSE 123* 141*  --   --  120* 103*  BUN 37* 24*  --   --  15 11  CREATININE 1.78* 0.93  --   --  0.73 0.67  CALCIUM 8.9 8.3*  --   --  8.3* 8.9  MG  --   --   --  2.0  --   --    Liver Function Tests: Recent Labs  Lab 10/05/22 1938  AST 30  ALT 23  ALKPHOS 52  BILITOT 1.2  PROT 7.2  ALBUMIN 4.3   No results for input(s): "LIPASE", "AMYLASE" in the last 168 hours. No results for input(s): "AMMONIA" in the last 168 hours. CBC: Recent Labs  Lab 10/05/22 1938 10/06/22 0512 10/07/22 0413 10/08/22 0618  WBC 11.7* 7.7 6.7 6.7  NEUTROABS 6.7  --   --   --   HGB 14.7 12.7 11.7* 12.5  HCT 44.0 38.0 35.7* 38.6  MCV 91.1 91.6 91.5 92.1  PLT 339 254 239 298   Cardiac Enzymes: No results for input(s): "CKTOTAL", "CKMB", "CKMBINDEX", "TROPONINI" in the last 168 hours. BNP: Invalid input(s): "POCBNP" CBG: Recent Labs  Lab 10/07/22 2103 10/08/22 0507 10/08/22 0750 10/08/22 1211 10/08/22 1302  GLUCAP 123* 84 90 67* 128*   D-Dimer No results for  input(s): "DDIMER" in the last 72 hours. Hgb A1c Recent Labs    10/05/22 1938  HGBA1C 5.5   Lipid Profile No results for input(s): "CHOL", "HDL", "LDLCALC", "TRIG", "CHOLHDL", "LDLDIRECT" in the last 72 hours. Thyroid function studies No results for input(s): "TSH", "T4TOTAL", "T3FREE", "THYROIDAB" in the last 72 hours.  Invalid input(s): "FREET3" Anemia work up No results for input(s): "VITAMINB12", "FOLATE", "FERRITIN",  "TIBC", "IRON", "RETICCTPCT" in the last 72 hours. Urinalysis    Component Value Date/Time   COLORURINE YELLOW (A) 10/06/2022 0032   COLORURINE YELLOW (A) 10/06/2022 0032   APPEARANCEUR CLEAR (A) 10/06/2022 0032   APPEARANCEUR CLEAR (A) 10/06/2022 0032   APPEARANCEUR Clear 08/27/2016 1550   LABSPEC 1.009 10/06/2022 0032   LABSPEC 1.009 10/06/2022 0032   PHURINE 5.0 10/06/2022 0032   PHURINE 5.0 10/06/2022 0032   GLUCOSEU NEGATIVE 10/06/2022 0032   GLUCOSEU NEGATIVE 10/06/2022 0032   HGBUR NEGATIVE 10/06/2022 0032   HGBUR NEGATIVE 10/06/2022 0032   BILIRUBINUR NEGATIVE 10/06/2022 0032   BILIRUBINUR NEGATIVE 10/06/2022 0032   BILIRUBINUR Negative 08/27/2016 1550   KETONESUR NEGATIVE 10/06/2022 0032   KETONESUR NEGATIVE 10/06/2022 0032   PROTEINUR NEGATIVE 10/06/2022 0032   PROTEINUR NEGATIVE 10/06/2022 0032   NITRITE NEGATIVE 10/06/2022 0032   NITRITE NEGATIVE 10/06/2022 0032   LEUKOCYTESUR NEGATIVE 10/06/2022 0032   LEUKOCYTESUR NEGATIVE 10/06/2022 0032   Sepsis Labs Recent Labs  Lab 10/05/22 1938 10/06/22 0512 10/07/22 0413 10/08/22 0618  WBC 11.7* 7.7 6.7 6.7   Microbiology No results found for this or any previous visit (from the past 240 hour(s)). Imaging CT Renal Stone Study  Result Date: 10/05/2022 CLINICAL DATA:  Acute kidney injury.  Fall. EXAM: CT ABDOMEN AND PELVIS WITHOUT CONTRAST TECHNIQUE: Multidetector CT imaging of the abdomen and pelvis was performed following the standard protocol without IV contrast. RADIATION DOSE REDUCTION: This exam was performed according to the departmental dose-optimization program which includes automated exposure control, adjustment of the mA and/or kV according to patient size and/or use of iterative reconstruction technique. COMPARISON:  03/21/2022 FINDINGS: Lower chest: Airspace opacity in the right lower lobe concerning for pneumonia. Heart borderline in size. Pacer wires in the right heart. Aortic atherosclerosis.  Hepatobiliary: No focal liver abnormality is seen. Status post cholecystectomy. No biliary dilatation. Pancreas: No focal abnormality or ductal dilatation. Spleen: No focal abnormality.  Normal size. Adrenals/Urinary Tract: No adrenal abnormality. No focal renal abnormality. No stones or hydronephrosis. Urinary bladder is unremarkable. Stomach/Bowel: Stomach, large and small bowel grossly unremarkable. Vascular/Lymphatic: Aortic atherosclerosis. No evidence of aneurysm or adenopathy. Reproductive: Prior hysterectomy.  No adnexal masses. Other: No free fluid or free air. Musculoskeletal: No acute bony abnormality. IMPRESSION: Airspace opacity in the right lower lobe paddle with pneumonia. No acute findings in the abdomen or pelvis. Aortic atherosclerosis. Electronically Signed   By: Charlett Nose M.D.   On: 10/05/2022 21:48   DG Sacrum/Coccyx  Result Date: 10/05/2022 CLINICAL DATA:  Trauma, fall EXAM: SACRUM AND COCCYX - 2+ VIEW COMPARISON:  None Available. FINDINGS: No recent displaced fracture is seen. SI joints are unremarkable. Degenerative changes are noted in the visualized lower lumbar spine. IMPRESSION: No recent fracture is seen.  Lumbar spondylosis. Electronically Signed   By: Ernie Avena M.D.   On: 10/05/2022 20:20   DG Lumbar Spine Complete  Result Date: 10/05/2022 CLINICAL DATA:  Trauma, fall, pain EXAM: LUMBAR SPINE - COMPLETE 4+ VIEW COMPARISON:  08/07/2022 FINDINGS: There are 6 non-rib-bearing vertebrae in lumbar spine. This may suggest partial lumbarization of  S1 vertebra. No recent fracture is seen. Alignment of posterior margins of vertebral bodies is within normal limits. Degenerative changes are noted with small anterior bony spurs and facet hypertrophy, more so at the L4-L5 and L5-S1 levels. No significant interval changes are noted in bony structures. Arterial calcifications are seen in aorta and its major branches. Surgical clips are seen in gallbladder fossa. IMPRESSION: No  recent fracture is seen. Lumbar spondylosis. No significant interval changes are noted since 08/07/2022. Electronically Signed   By: Ernie Avena M.D.   On: 10/05/2022 20:18   DG Chest 2 View  Result Date: 10/05/2022 CLINICAL DATA:  Trauma, fall, fever, cough EXAM: CHEST - 2 VIEW COMPARISON:  03/21/2022 FINDINGS: Transverse diameter of heart is increased. There is prosthetic aortic valve. Pacemaker battery is seen in right infraclavicular region with tips of leads in right atrium and right ventricle. Lung fields are clear of any infiltrates or pulmonary edema. There is no pleural effusion or pneumothorax. There is mild extrinsic pressure over the right lateral margin of Aki in the lower neck, possibly due to enlarged right lobe of thyroid. IMPRESSION: No active cardiopulmonary disease. Electronically Signed   By: Ernie Avena M.D.   On: 10/05/2022 20:13   DG Cervical Spine 2-3 Views  Result Date: 10/05/2022 CLINICAL DATA:  Pain after fall EXAM: CERVICAL SPINE - 3 VIEW COMPARISON:  None Available. FINDINGS: Preserved vertebral body heights, alignment and prevertebral soft tissues. There is multilevel disc height loss with endplate osteophytes in particular from C4 through C7. No listhesis. Prominent facet degenerative changes seen particularly left mid cervical spine. Recommend continue precautions until clinical clearance and if there is further concern of injury recommend further workup with CT for higher sensitivity as a significant portion of acute cervical spine injuries are x-ray occult. Note is made of sternal wires at the edge of the imaging field with battery pack along the right upper chest. Osteopenia IMPRESSION: Osteopenia with moderate degenerative changes of the mid to lower cervical spine greatest at C6-7. Postop chest.  Pacemaker. Electronically Signed   By: Karen Kays M.D.   On: 10/05/2022 17:17   CT Head Wo Contrast  Result Date: 10/05/2022 CLINICAL DATA:  Head trauma,  minor, normal mental status (Age 89-64y). Multiple falls. EXAM: CT HEAD WITHOUT CONTRAST TECHNIQUE: Contiguous axial images were obtained from the base of the skull through the vertex without intravenous contrast. RADIATION DOSE REDUCTION: This exam was performed according to the departmental dose-optimization program which includes automated exposure control, adjustment of the mA and/or kV according to patient size and/or use of iterative reconstruction technique. COMPARISON:  Head CT 08/07/2022 FINDINGS: Brain: There is no evidence of an acute infarct, intracranial hemorrhage, mass, midline shift, or extra-axial fluid collection. Head a moderate-sized chronic infarct is again noted in the left frontal lobe. Patchy hypodensities elsewhere in the cerebral white matter bilaterally are unchanged and nonspecific but compatible with mild-to-moderate chronic small vessel ischemic disease. A chronic infarct is again noted inferiorly in the left basal ganglia, and there is also underlying chronic bilateral caudate nucleus volume loss. Vascular: Calcified atherosclerosis at the skull base. Skull: No fracture or suspicious osseous lesion. Sinuses/Orbits: Opacification of a single posterior right ethmoid air cell. Clear mastoid air cells. Left cataract extraction. Other: None. IMPRESSION: 1. No evidence of acute intracranial abnormality. 2. Chronic ischemia with multiple old infarcts as above. Electronically Signed   By: Sebastian Ache M.D.   On: 10/05/2022 17:03      Time coordinating discharge: over  30 minutes  SIGNED:  Emeterio Reeve DO [facility]

## 2022-10-08 NOTE — Consult Note (Signed)
[facility] Customer service manager [facility]) Accountable Care Organization (ACO) [facility] Liaison Note  10/08/2022  [name] [birth year] [id number]  Location: [facility] RN Hospital Liaison met patient at bedside at [facility].  Insurance: [employer]   Brianna Harris is a 62 y.o. female who is a Primary Care Patient of Gracelyn Nurse, MD. The patient was screened for  readmission hospitalization with noted medium risk score for unplanned readmission risk with 1 IP/1 ED in 6 months.  The patient was assessed for potential [facility]) Care Management service needs for post hospital transition for care coordination. Review of patient's electronic medical record reveals patient was admitted for pneumonia. Liaison attempted to visit pt at bedside however pt has left. Liaison able to call and speak with the POA Mother [name] who indicates pt is deaf. Liaison introduced available services for post hospital follow up from a care coordinator RN (receptive). Will make a referral accordingly for ongoing care management services.  Plan: [facility] Liaison will continue to follow progress and disposition to asess for post hospital community care coordination/management needs.  Referral request for community care coordination: pending disposition.   [facility] Care Management/Population Health does not replace or interfere with any arrangements made by the Inpatient Transition of Care team.   For questions contact:   Elliot Cousin, RN, BSN [facility] Liaison [place]   [facility]  Population Health Office Hours MTWF  8:00 am-6:00 pm Off on Thursday [phone] mobile [phone] [Office toll free line][facility] Office Hours are M-F 8:30 - 5 pm 24 hour nurse advise line [phone] Concierge  [email or web address]

## 2022-10-08 NOTE — Progress Notes (Signed)
  Care Coordination   Note   10/08/2022 Name: Brianna Harris MRN: [id number] DOB: [birth year]  Brianna Harris is a 62 y.o. year old female who sees Gracelyn Nurse, MD for primary care. I reached out to Merlene Morse by phone today to offer care coordination services.  [name] was given information about Care Coordination services today including:   The Care Coordination services include support from the care team which includes your Nurse Coordinator, Clinical Social Worker, or Pharmacist.  The Care Coordination team is here to help remove barriers to the health concerns and goals most important to you. Care Coordination services are voluntary, and the patient may decline or stop services at any time by request to their care team member.   Care Coordination Consent Status: Patient agreed to services and verbal consent obtained.   Follow up plan:  Telephone appointment with care coordination team member scheduled for:  10/11/2022  Encounter Outcome:  Pt. Scheduled from referral   Burman Nieves, [facility] Care Coordination Care Guide Direct Dial: [phone]

## 2022-10-08 NOTE — Progress Notes (Signed)
Physical Therapy Treatment Patient Details Name: Brianna Harris MRN: [id number] DOB: 02/06/1961 Today's Date: 10/08/2022   History of Present Illness Pt is a 62 y.o. female presenting to hospital 10/05/22 s/p multiple falls and generalized weakness.  Pt admitted with community acquired PNA, seizure, falls, AKI.  PMH includes hearing impairment, asthma, htn, DM, HLD, heart valve on Coumadin, cardiac pacemaker in situ, h/o CVA.  Per chart pt has chronic memory issues since her stroke (per pt's mother); deafness present since birth.    PT Comments    Pt in chair, ready for session.  Ipad used  - Beka [id number].  She prefers ipad/in person vs written.   She is able to stand with no assist and walk 150' with RW and min guard/supervision.  RW is removed for the last 50'.  She does well with and without RW but is encouraged to use RW initially upon discharge and she voiced understanding.  She does endorse continued weakness but is progressing well with mobility.   Recommendations for follow up therapy are one component of a multi-disciplinary discharge planning process, led by the attending physician.  Recommendations may be updated based on patient status, additional functional criteria and insurance authorization.  Follow Up Recommendations  Can patient physically be transported by private vehicle: Yes    Assistance Recommended at Discharge Intermittent Supervision/Assistance  Patient can return home with the following A little help with walking and/or transfers;A little help with bathing/dressing/bathroom;Assistance with cooking/housework;Assist for transportation;Help with stairs or ramp for entrance   Equipment Recommendations  Rolling walker (2 wheels) (TBD)    Recommendations for Other Services       Precautions / Restrictions Precautions Precautions: Fall Restrictions Weight Bearing Restrictions: No     Mobility  Bed Mobility               General bed mobility comments: in  chair before and after Patient Response: Cooperative  Transfers Overall transfer level: Needs assistance Equipment used: None Transfers: Sit to/from Stand Sit to Stand: Supervision                Ambulation/Gait Ambulation/Gait assistance: Supervision, Min guard Gait Distance (Feet): 200 Feet Assistive device: Rolling walker (2 wheels), None Gait Pattern/deviations: Step-through pattern, Decreased step length - right, Decreased step length - left Gait velocity: increased speed today and overall quality     General Gait Details: 150' with RW then last 37' with no AD   Stairs             Wheelchair Mobility    Modified Rankin (Stroke Patients Only)       Balance Overall balance assessment: Needs assistance Sitting-balance support: No upper extremity supported, Feet supported Sitting balance-Leahy Scale: Good     Standing balance support: Bilateral upper extremity supported Standing balance-Leahy Scale: Fair                              Cognition Arousal/Alertness: Awake/alert Behavior During Therapy: WFL for tasks assessed/performed Overall Cognitive Status: History of cognitive impairments - at baseline                                          Exercises      General Comments        Pertinent Vitals/Pain Pain Assessment Pain Assessment: No/denies pain    Home Living  Prior Function            PT Goals (current goals can now be found in the care plan section) Progress towards PT goals: Progressing toward goals    Frequency    Min 4X/week      PT Plan      Co-evaluation              AM-PAC PT "6 Clicks" Mobility   Outcome Measure  Help needed turning from your back to your side while in a flat bed without using bedrails?: None Help needed moving from lying on your back to sitting on the side of a flat bed without using bedrails?: None Help needed moving to  and from a bed to a chair (including a wheelchair)?: None Help needed standing up from a chair using your arms (e.g., wheelchair or bedside chair)?: None Help needed to walk in hospital room?: A Little Help needed climbing 3-5 steps with a railing? : A Little 6 Click Score: 22    End of Session Equipment Utilized During Treatment: Gait belt Activity Tolerance: Patient tolerated treatment well Patient left:  (nurse present assisting pt to recliner end of PT session; nurse to set pt up with pt needs) Nurse Communication: Mobility status;Precautions PT Visit Diagnosis: Unsteadiness on feet (R26.81);Muscle weakness (generalized) (M62.81);History of falling (Z91.81);Repeated falls (R29.6);Other abnormalities of gait and mobility (R26.89);Pain Pain - part of body:  (low back)     Time: 1610-9604 PT Time Calculation (min) (ACUTE ONLY): 11 min  Charges:  $Gait Training: 8-22 mins                   Danielle Dess, PTA 10/08/22, 8:55 AM

## 2022-10-09 ENCOUNTER — Telehealth: Payer: Self-pay | Admitting: *Deleted

## 2022-10-09 NOTE — Patient Outreach (Signed)
  Care Coordination   10/09/2022 Name: Brianna Harris MRN: [id number] DOB: 05-30-1960   Care Coordination Outreach Attempts:  An unsuccessful telephone outreach was attempted today to offer the patient information about available care coordination services.  Follow Up Plan:  Additional outreach attempts will be made to offer the patient care coordination information and services.   Encounter Outcome:  No Answer   Care Coordination Interventions:  No, not indicated    Kemper Durie, RN, MSN, [facility] Care Management Care Management Coordinator [phone]

## 2022-10-09 NOTE — Patient Outreach (Signed)
  Care Coordination   Initial Visit Note   10/11/2022 Name: ZORYANA MONROE MRN: [id number] DOB: 1960-07-21  RUGENIA HAUSAUER is a 62 y.o. year old female who sees Gracelyn Nurse, MD for primary care. I spoke with [name], mother of ANIRAH HUTMACHER by phone today.  What matters to the patients health and wellness today?  Ability to pay medical bills and decrease cough.  Notified that mother had questions about medications, now state questions have been answered.   Goals Addressed             This Visit's Progress    Recovery from recent hospitalization for pneumonia       Interventions Today    Flowsheet Row Most Recent Value  Chronic Disease   Chronic disease during today's visit Other  [pneumonia]  General Interventions   General Interventions Discussed/Reviewed General Interventions Reviewed, Doctor Visits  Doctor Visits Discussed/Reviewed Doctor Visits Reviewed, PCP  Eather Colas of need for PCP follow up post discharge, waiting for office to call back with date/time]  Education Interventions   Education Provided Provided Education  Provided Verbal Education On Medication, Community Resources  [Mother report cough, has medication for cough suppressant.  concern for medical bills, provided with patient assistance application and phone number]              SDOH assessments and interventions completed:  Yes  SDOH Interventions Today    Flowsheet Row Most Recent Value  SDOH Interventions   Food Insecurity Interventions Intervention Not Indicated  Housing Interventions Intervention Not Indicated        Care Coordination Interventions:  Yes, provided   Follow up plan: Follow up call scheduled for 6/20    Encounter Outcome:  Pt. Visit Completed   Kemper Durie, RN, MSN, [facility] Care Management Care Management Coordinator [phone]

## 2022-10-10 ENCOUNTER — Encounter: Payer: Self-pay | Admitting: Urology

## 2022-10-10 ENCOUNTER — Ambulatory Visit: Payer: PPO | Admitting: Urology

## 2022-10-10 VITALS — BP 110/71 | HR 75 | Ht 62.0 in | Wt 124.0 lb

## 2022-10-10 DIAGNOSIS — R3915 Urgency of urination: Secondary | ICD-10-CM | POA: Diagnosis not present

## 2022-10-10 DIAGNOSIS — R300 Dysuria: Secondary | ICD-10-CM | POA: Diagnosis not present

## 2022-10-10 DIAGNOSIS — Z952 Presence of prosthetic heart valve: Secondary | ICD-10-CM | POA: Diagnosis not present

## 2022-10-10 DIAGNOSIS — R350 Frequency of micturition: Secondary | ICD-10-CM

## 2022-10-10 DIAGNOSIS — N179 Acute kidney failure, unspecified: Secondary | ICD-10-CM | POA: Diagnosis not present

## 2022-10-10 DIAGNOSIS — R339 Retention of urine, unspecified: Secondary | ICD-10-CM | POA: Diagnosis not present

## 2022-10-10 DIAGNOSIS — Z9181 History of falling: Secondary | ICD-10-CM | POA: Diagnosis not present

## 2022-10-10 DIAGNOSIS — Z87898 Personal history of other specified conditions: Secondary | ICD-10-CM

## 2022-10-10 DIAGNOSIS — I350 Nonrheumatic aortic (valve) stenosis: Secondary | ICD-10-CM | POA: Diagnosis not present

## 2022-10-10 LAB — BLADDER SCAN AMB NON-IMAGING: Scan Result: 0

## 2022-10-10 MED ORDER — GEMTESA 75 MG PO TABS: 75.0000 mg | ORAL_TABLET | Freq: Every day | ORAL | 0 refills | Status: DC

## 2022-10-10 NOTE — Progress Notes (Signed)
I, Duke Salvia, acting as a Neurosurgeon for Riki Altes, MD., have documented all relevant documentation on the behalf of Riki Altes, MD, as directed by  Riki Altes, MD while in the presence of Riki Altes, MD.  10/10/2022 3:18 PM   Brianna Harris 1960/08/27 [id number]  Referring provider: Gracelyn Nurse, MD [address] [place],  [place] [address]  Chief Complaint  Patient presents with   New Patient (Initial Visit)    HPI: Brianna Harris is a 62 y.o. female presenting for evaluation of dysuria. A sign interpreter was present at this visit as well as her mother, who contributed to the history.  She saw Dr. Laural Benes 09/12/2022 for an annual physical and mentioned difficulty urinating and urology referral was placed. Urinalysis was unremarkable. A urine culture early May 2024 grew mixed flora. On record review, she had 2 positive cultures for enteroccocus and strep in 2017 and streptococcus August 2023. She was hospitalized 6/14 with pneumonia and developed urinary retention while hospitalized, requiring Foley catheter placement. The Foley was removed after 24 hours and she was able to void successfully prior to discharge. She was taking Elavil, whivh the dose has been decreased during her hospitalization. Currently complains of urinary frequency and urgency. PVR today is 0 mL. She was presently on antibiotics, though chart review shows no recent urine cultures.   PMH: Past Medical History:  Diagnosis Date   Asthma    Deafness    Depression    Diabetes mellitus without complication (HCC)    Family history of adverse reaction to anesthesia    Mother - PONV   GERD (gastroesophageal reflux disease)    Heart abnormality    per mother pt has bad heart murmor- sees Dr Lady Gary    Hyperlipidemia    Hypertension    Mechanical heart valve present    PONV (postoperative nausea and vomiting)    Presence of permanent cardiac pacemaker    Staring episodes      Surgical History: Past Surgical History:  Procedure Laterality Date   ABDOMINAL HYSTERECTOMY  2000   per mother [facility] thought pt cancer and did hyster ,cervix and ovaries   CARDIAC VALVE REPLACEMENT     CATARACT EXTRACTION W/PHACO Left 02/14/2021   Procedure: CATARACT EXTRACTION PHACO AND INTRAOCULAR LENS PLACEMENT (IOC) Left DIABETIC;  Surgeon: Galen Manila, MD;  [facility];  Service: Ophthalmology;  Laterality: Left;  leave arrival at 9 sign language interpreter has been requested  kp 6.12 00:39.2   CHOLECYSTECTOMY     COLONOSCOPY WITH PROPOFOL N/A 08/17/2016   Procedure: COLONOSCOPY WITH PROPOFOL;  Surgeon: Scot Jun, MD;  Location: [facility] END[facility]COPY;  Service: Endoscopy;  Laterality: N/A;   COLONOSCOPY WITH PROPOFOL N/A 02/02/2020   Procedure: COLONOSCOPY WITH PROPOFOL;  Surgeon: Toney Reil, MD;  Location: [facility] ENDOSCOPY;  Service: Gastroenterology;  Laterality: N/A;   ESOPHAGOGASTRODUODENOSCOPY (EGD) WITH PROPOFOL N/A 08/17/2016   Procedure: ESOPHAGOGASTRODUODENOSCOPY (EGD) WITH PROPOFOL;  Surgeon: Scot Jun, MD;  Location: [facility] ENDOSCOPY;  Service: Endoscopy;  Laterality: N/A;   ESOPHAGOGASTRODUODENOSCOPY (EGD) WITH PROPOFOL  02/02/2020   Procedure: ESOPHAGOGASTRODUODENOSCOPY (EGD) WITH PROPOFOL;  Surgeon: Toney Reil, MD;  Location: [facility] ENDOSCOPY;  Service: Gastroenterology;;   PACEMAKER IMPLANT  01/2018    Home Medications:  Allergies as of 10/10/2022   No Known Allergies      Medication List        Accurate as of October 10, 2022  3:18 PM.  If you have any questions, ask your nurse or doctor.          acetaminophen 500 MG tablet Commonly known as: TYLENOL Take 1,000 mg by mouth every 6 (six) hours as needed for moderate pain.   amitriptyline 25 MG tablet Commonly known as: ELAVIL Take 25 mg by mouth at bedtime.   amoxicillin-clavulanate 500-125 MG tablet Commonly known as: AUGMENTIN Take 1 tablet by mouth 2  (two) times daily for 5 days.   atorvastatin 40 MG tablet Commonly known as: LIPITOR Take 40 mg by mouth at bedtime.   azithromycin 250 MG tablet Commonly known as: Zithromax Take 1 tablet (250 mg total) by mouth daily for 3 days.   dextromethorphan-guaiFENesin 30-600 MG 12hr tablet Commonly known as: MUCINEX DM Take 1 tablet by mouth 2 (two) times daily as needed for cough.   Dulaglutide 0.75 MG/0.5ML Sopn Inject 0.75 mg into the skin once a week.   enoxaparin 60 MG/0.6ML injection Commonly known as: LOVENOX Inject 0.6 mLs (60 mg total) into the skin every 12 (twelve) hours. Until directed otherwise based on INR   Gemtesa 75 MG Tabs Generic drug: Vibegron Take 1 tablet (75 mg total) by mouth daily. Started by: Riki Altes, MD   HYDROcodone bit-homatropine 5-1.5 MG/5ML syrup Commonly known as: HYCODAN Take 5 mLs by mouth every 8 (eight) hours as needed for cough.   lamoTRIgine 100 MG tablet Commonly known as: LAMICTAL Take 100 mg by mouth 2 (two) times daily.   metFORMIN 500 MG 24 hr tablet Commonly known as: GLUCOPHAGE-XR Take 1,000 mg by mouth 2 (two) times daily.   omeprazole 40 MG capsule Commonly known as: PRILOSEC Take 40 mg by mouth daily.   potassium chloride SA 20 MEQ tablet Commonly known as: KLOR-CON M Take 1 tablet (20 mEq total) by mouth 2 (two) times daily for 5 days.   warfarin 3 MG tablet Commonly known as: COUMADIN Take 3 mg by mouth daily. (Take with 1mg  tablet to equal 4mg  total)   warfarin 1 MG tablet Commonly known as: COUMADIN Take 1 mg by mouth daily. (Take with 3mg  tablet to equal 4mg  total)        Family History: Family History  Problem Relation Age of Onset   Diabetes Maternal Uncle    Heart attack Father    Kidney cancer Paternal Uncle    Breast cancer Neg Hx    Bladder Cancer Neg Hx     Social History:  reports that she quit smoking about 4 years ago. Her smoking use included cigarettes. She has a 58.50 pack-year  smoking history. She has never used smokeless tobacco. She reports that she does not currently use alcohol. She reports current drug use. Drug: Marijuana.   Physical Exam: BP 110/71   Pulse 75   Ht 5\' 2"  (1.575 m)   Wt 124 lb (56.2 kg)   BMI 22.68 kg/m   Constitutional:  Alert and oriented, No acute distress. HEENT: [place] AT Respiratory: Normal respiratory effort, no increased work of breathing.   Assessment & Plan:    1. History of urinary retention PVR today 0 mL.  2. Urinary frequency/urgency Trial of Gemtesa 75 mg daily-samples given. Schedule cystoscopy.  I have reviewed the above documentation for accuracy and completeness, and I agree with the above.   Riki Altes, MD  [facility] [address], [address] [place], [place] [address] [phone]

## 2022-10-11 ENCOUNTER — Ambulatory Visit: Payer: Self-pay | Admitting: *Deleted

## 2022-10-11 ENCOUNTER — Encounter: Payer: Self-pay | Admitting: *Deleted

## 2022-10-11 NOTE — Patient Outreach (Signed)
  Care Coordination   10/11/2022 Name: Brianna Harris MRN: [id number] DOB: December 19, 1960   Care Coordination Outreach Attempts:  An unsuccessful telephone outreach was attempted for a scheduled appointment today.  Follow Up Plan:  Additional outreach attempts will be made to offer the patient care coordination information and services.   Encounter Outcome:  No Answer   Care Coordination Interventions:  No, not indicated    Kemper Durie, RN, MSN, [facility] Care Management Care Management Coordinator [phone]

## 2022-10-11 NOTE — Patient Instructions (Signed)
Visit Information  Thank you for taking time to visit with me today. Please don't hesitate to contact me if I can be of assistance to you before our next scheduled telephone appointment.  Following are the goals we discussed today:  Call [facility] Patient Assistance for questions about bill and payment plans.  Please call the care guide team at [phone] if you need to cancel or reschedule your appointment.   Please call the Suicide and Crisis Lifeline: 988 call the [place] National Suicide Prevention Lifeline: 606 647 3000 or TTY: 858-373-6953 TTY (610)205-7190) to talk to a trained counselor call 1-800-273-TALK (toll free, 24 hour hotline) call 911 if you are experiencing a Mental Health or Behavioral Health Crisis or need someone to talk to.  Patient verbalizes understanding of instructions and care plan provided today and agrees to view in MyChart. Active MyChart status and patient understanding of how to access instructions and care plan via MyChart confirmed with patient.     The patient has been provided with contact information for the care management team and has been advised to call with any health related questions or concerns.   Kemper Durie, RN, MSN, [facility] Care Management Care Management Coordinator [phone]

## 2022-10-12 ENCOUNTER — Encounter: Payer: Self-pay | Admitting: Oncology

## 2022-10-15 ENCOUNTER — Ambulatory Visit: Payer: PPO | Admitting: Urology

## 2022-10-15 DIAGNOSIS — Z7984 Long term (current) use of oral hypoglycemic drugs: Secondary | ICD-10-CM | POA: Diagnosis not present

## 2022-10-15 DIAGNOSIS — J189 Pneumonia, unspecified organism: Secondary | ICD-10-CM | POA: Diagnosis not present

## 2022-10-15 DIAGNOSIS — Z09 Encounter for follow-up examination after completed treatment for conditions other than malignant neoplasm: Secondary | ICD-10-CM | POA: Diagnosis not present

## 2022-10-15 DIAGNOSIS — I10 Essential (primary) hypertension: Secondary | ICD-10-CM | POA: Diagnosis not present

## 2022-10-15 DIAGNOSIS — R053 Chronic cough: Secondary | ICD-10-CM | POA: Diagnosis not present

## 2022-10-15 DIAGNOSIS — R899 Unspecified abnormal finding in specimens from other organs, systems and tissues: Secondary | ICD-10-CM | POA: Diagnosis not present

## 2022-10-15 DIAGNOSIS — J45909 Unspecified asthma, uncomplicated: Secondary | ICD-10-CM | POA: Diagnosis not present

## 2022-10-15 DIAGNOSIS — E119 Type 2 diabetes mellitus without complications: Secondary | ICD-10-CM | POA: Diagnosis not present

## 2022-10-15 DIAGNOSIS — E875 Hyperkalemia: Secondary | ICD-10-CM | POA: Diagnosis not present

## 2022-10-15 DIAGNOSIS — Z952 Presence of prosthetic heart valve: Secondary | ICD-10-CM | POA: Diagnosis not present

## 2022-10-15 DIAGNOSIS — M533 Sacrococcygeal disorders, not elsewhere classified: Secondary | ICD-10-CM | POA: Diagnosis not present

## 2022-10-15 DIAGNOSIS — E1165 Type 2 diabetes mellitus with hyperglycemia: Secondary | ICD-10-CM | POA: Diagnosis not present

## 2022-10-15 DIAGNOSIS — R296 Repeated falls: Secondary | ICD-10-CM | POA: Diagnosis not present

## 2022-10-15 DIAGNOSIS — Z7985 Long-term (current) use of injectable non-insulin antidiabetic drugs: Secondary | ICD-10-CM | POA: Diagnosis not present

## 2022-10-15 DIAGNOSIS — R791 Abnormal coagulation profile: Secondary | ICD-10-CM | POA: Diagnosis not present

## 2022-10-15 DIAGNOSIS — E785 Hyperlipidemia, unspecified: Secondary | ICD-10-CM | POA: Diagnosis not present

## 2022-10-15 DIAGNOSIS — H9190 Unspecified hearing loss, unspecified ear: Secondary | ICD-10-CM | POA: Diagnosis not present

## 2022-10-16 ENCOUNTER — Telehealth: Payer: Self-pay | Admitting: *Deleted

## 2022-10-16 NOTE — Progress Notes (Signed)
  Care Coordination Note  10/16/2022 Name: Brianna Harris MRN: [id number] DOB: 07/03/1960  Brianna Harris is a 62 y.o. year old female who is a primary care patient of Gracelyn Nurse, MD and is actively engaged with the care management team. I reached out to Merlene Morse by phone today to assist with re-scheduling an initial visit with the RN Case Manager  Follow up plan: Patient declines further follow up and engagement by the care management team. Appropriate care team members and provider have been notified via electronic communication.   Burman Nieves, CCMA Care Coordination Care Guide Direct Dial: [phone]

## 2022-10-18 ENCOUNTER — Ambulatory Visit
Admission: RE | Admit: 2022-10-18 | Discharge: 2022-10-18 | Disposition: A | Discharge status: Home / Self Care | Payer: PPO | Source: Ambulatory Visit | Attending: Internal Medicine | Admitting: Internal Medicine

## 2022-10-18 ENCOUNTER — Encounter: Payer: Self-pay | Admitting: Oncology

## 2022-10-18 DIAGNOSIS — Z1231 Encounter for screening mammogram for malignant neoplasm of breast: Secondary | ICD-10-CM | POA: Diagnosis not present

## 2022-10-19 DIAGNOSIS — Z7985 Long-term (current) use of injectable non-insulin antidiabetic drugs: Secondary | ICD-10-CM | POA: Diagnosis not present

## 2022-10-19 DIAGNOSIS — E119 Type 2 diabetes mellitus without complications: Secondary | ICD-10-CM | POA: Diagnosis not present

## 2022-10-19 DIAGNOSIS — J45909 Unspecified asthma, uncomplicated: Secondary | ICD-10-CM | POA: Diagnosis not present

## 2022-10-19 DIAGNOSIS — Z7984 Long term (current) use of oral hypoglycemic drugs: Secondary | ICD-10-CM | POA: Diagnosis not present

## 2022-10-19 DIAGNOSIS — H9190 Unspecified hearing loss, unspecified ear: Secondary | ICD-10-CM | POA: Diagnosis not present

## 2022-10-19 DIAGNOSIS — I10 Essential (primary) hypertension: Secondary | ICD-10-CM | POA: Diagnosis not present

## 2022-10-19 DIAGNOSIS — J188 Other pneumonia, unspecified organism: Secondary | ICD-10-CM | POA: Diagnosis not present

## 2022-10-22 DIAGNOSIS — R791 Abnormal coagulation profile: Secondary | ICD-10-CM | POA: Diagnosis not present

## 2022-10-29 DIAGNOSIS — R791 Abnormal coagulation profile: Secondary | ICD-10-CM | POA: Diagnosis not present

## 2022-11-04 ENCOUNTER — Encounter: Payer: Self-pay | Admitting: *Deleted

## 2022-11-05 DIAGNOSIS — R791 Abnormal coagulation profile: Secondary | ICD-10-CM | POA: Diagnosis not present

## 2022-11-07 ENCOUNTER — Encounter: Payer: Self-pay | Admitting: Urology

## 2022-11-07 ENCOUNTER — Encounter: Payer: Self-pay | Admitting: Oncology

## 2022-11-07 ENCOUNTER — Inpatient Hospital Stay: Payer: PPO | Admitting: Oncology

## 2022-11-07 ENCOUNTER — Ambulatory Visit: Payer: PPO | Admitting: Urology

## 2022-11-07 ENCOUNTER — Inpatient Hospital Stay: Payer: PPO | Attending: Oncology

## 2022-11-07 VITALS — BP 130/80 | HR 74 | Ht 66.0 in | Wt 124.0 lb

## 2022-11-07 VITALS — BP 135/61 | HR 78 | Temp 96.7°F | Resp 18 | Ht 66.0 in | Wt 123.9 lb

## 2022-11-07 DIAGNOSIS — Z950 Presence of cardiac pacemaker: Secondary | ICD-10-CM | POA: Diagnosis not present

## 2022-11-07 DIAGNOSIS — Z79899 Other long term (current) drug therapy: Secondary | ICD-10-CM | POA: Diagnosis not present

## 2022-11-07 DIAGNOSIS — Z5982 Transportation insecurity: Secondary | ICD-10-CM | POA: Diagnosis not present

## 2022-11-07 DIAGNOSIS — Z9049 Acquired absence of other specified parts of digestive tract: Secondary | ICD-10-CM | POA: Insufficient documentation

## 2022-11-07 DIAGNOSIS — Z7901 Long term (current) use of anticoagulants: Secondary | ICD-10-CM | POA: Diagnosis not present

## 2022-11-07 DIAGNOSIS — D509 Iron deficiency anemia, unspecified: Secondary | ICD-10-CM

## 2022-11-07 DIAGNOSIS — N3281 Overactive bladder: Secondary | ICD-10-CM

## 2022-11-07 DIAGNOSIS — F1290 Cannabis use, unspecified, uncomplicated: Secondary | ICD-10-CM | POA: Insufficient documentation

## 2022-11-07 DIAGNOSIS — Z952 Presence of prosthetic heart valve: Secondary | ICD-10-CM | POA: Diagnosis not present

## 2022-11-07 DIAGNOSIS — Z87891 Personal history of nicotine dependence: Secondary | ICD-10-CM | POA: Insufficient documentation

## 2022-11-07 DIAGNOSIS — Z833 Family history of diabetes mellitus: Secondary | ICD-10-CM | POA: Insufficient documentation

## 2022-11-07 DIAGNOSIS — Z8051 Family history of malignant neoplasm of kidney: Secondary | ICD-10-CM | POA: Diagnosis not present

## 2022-11-07 DIAGNOSIS — J45909 Unspecified asthma, uncomplicated: Secondary | ICD-10-CM | POA: Diagnosis not present

## 2022-11-07 DIAGNOSIS — R350 Frequency of micturition: Secondary | ICD-10-CM

## 2022-11-07 DIAGNOSIS — E119 Type 2 diabetes mellitus without complications: Secondary | ICD-10-CM | POA: Insufficient documentation

## 2022-11-07 DIAGNOSIS — D729 Disorder of white blood cells, unspecified: Secondary | ICD-10-CM

## 2022-11-07 DIAGNOSIS — Z90710 Acquired absence of both cervix and uterus: Secondary | ICD-10-CM | POA: Insufficient documentation

## 2022-11-07 DIAGNOSIS — Z8249 Family history of ischemic heart disease and other diseases of the circulatory system: Secondary | ICD-10-CM | POA: Insufficient documentation

## 2022-11-07 LAB — CBC WITH DIFFERENTIAL/PLATELET
Abs Immature Granulocytes: 0.05 10*3/uL (ref 0.00–0.07)
Basophils Absolute: 0.1 10*3/uL (ref 0.0–0.1)
Basophils Relative: 1 %
Eosinophils Absolute: 0.5 10*3/uL (ref 0.0–0.5)
Eosinophils Relative: 4 %
HCT: 40.9 % (ref 36.0–46.0)
Hemoglobin: 13.6 g/dL (ref 12.0–15.0)
Immature Granulocytes: 0 %
Lymphocytes Relative: 28 %
Lymphs Abs: 3.7 10*3/uL (ref 0.7–4.0)
MCH: 29.8 pg (ref 26.0–34.0)
MCHC: 33.3 g/dL (ref 30.0–36.0)
MCV: 89.7 fL (ref 80.0–100.0)
Monocytes Absolute: 0.7 10*3/uL (ref 0.1–1.0)
Monocytes Relative: 5 %
Neutro Abs: 8 10*3/uL — ABNORMAL HIGH (ref 1.7–7.7)
Neutrophils Relative %: 62 %
Platelets: 338 10*3/uL (ref 150–400)
RBC: 4.56 MIL/uL (ref 3.87–5.11)
RDW: 13.3 % (ref 11.5–15.5)
WBC: 13 10*3/uL — ABNORMAL HIGH (ref 4.0–10.5)
nRBC: 0 % (ref 0.0–0.2)

## 2022-11-07 LAB — MICROSCOPIC EXAMINATION: RBC, Urine: NONE SEEN /hpf (ref 0–2)

## 2022-11-07 LAB — URINALYSIS, COMPLETE
Bilirubin, UA: NEGATIVE
Glucose, UA: NEGATIVE
Ketones, UA: NEGATIVE
Nitrite, UA: NEGATIVE
Protein,UA: NEGATIVE
RBC, UA: NEGATIVE
Specific Gravity, UA: 1.015 (ref 1.005–1.030)
Urobilinogen, Ur: 0.2 mg/dL (ref 0.2–1.0)
pH, UA: 5.5 (ref 5.0–7.5)

## 2022-11-07 LAB — IRON AND TIBC
Iron: 76 ug/dL (ref 28–170)
Saturation Ratios: 22 % (ref 10.4–31.8)
TIBC: 351 ug/dL (ref 250–450)
UIBC: 275 ug/dL

## 2022-11-07 LAB — FERRITIN: Ferritin: 167 ng/mL (ref 11–307)

## 2022-11-07 MED ORDER — MIRABEGRON ER 50 MG PO TB24: 50.0000 mg | ORAL_TABLET | Freq: Every day | ORAL | 0 refills | Status: AC

## 2022-11-07 NOTE — Telephone Encounter (Signed)
Spoke with her mo and let her know that the patient does not need the iv iron.mom verbalized understanding.

## 2022-11-07 NOTE — Progress Notes (Signed)
   11/07/22  CC:  Chief Complaint  Patient presents with   Cysto    HPI: Refer to my previous note 10/10/2022.  A sign interpreter was present during the visit.  She had no improvement in her frequency and urgency on Gemtesa.  Blood pressure 130/80, pulse 74, height 5\' 6"  (1.676 m), weight 124 lb (56.2 kg). NED. A&Ox3.   No respiratory distress   Abd soft, NT, ND Normal external genitalia with patent urethral meatus  Cystoscopy Procedure Note  Patient identification was confirmed, informed consent was obtained, and patient was prepped using Betadine solution.  Lidocaine jelly was administered per urethral meatus.    Procedure: - Flexible cystoscope introduced, without any difficulty.   - Thorough search of the bladder revealed:    normal urethral meatus    normal urothelium    no stones    no ulcers     no tumors    no urethral polyps    no trabeculation  - Ureteral orifices were normal in position and appearance.  Post-Procedure: - Patient tolerated the procedure well  Assessment/ Plan: No abnormalities on cystoscopy Overactive bladder; no improvement on Gemtesa History urinary retention and would avoid anticholinergic medication Trial Myrbetriq 50 mg daily PA follow-up 1 month   Riki Altes, MD

## 2022-11-07 NOTE — Telephone Encounter (Signed)
-----   Message from Creig Hines sent at 11/07/2022  3:18 PM EDT ----- Tell her mother she does not need iv iron

## 2022-11-08 ENCOUNTER — Encounter: Payer: Self-pay | Admitting: Oncology

## 2022-11-08 NOTE — Progress Notes (Signed)
Hematology/Oncology Consult note [facility]  Telephone:([phone] [phone]  Patient Care Team: Gracelyn Nurse, MD as PCP - General (Internal Medicine) Lady Gary Darlin Priestly, MD as Consulting Physician (Cardiology) Kemper Durie, RN as [facility] Care Management   Name of the patient: Brianna Harris  [id number]  Feb 05, 1961   Date of visit: 11/08/22  Diagnosis-leukocytosis mainly neutrophilia likely secondary to smoking  Iron deficiency anemia  Chief complaint/ Reason for visit-routine follow-up of iron deficiency anemia  Heme/Onc history: Patient is a 62 year old female who have seen for leukocytosis since August 2019.  BCR ABL testing was negative for CML and peripheral flow cytometry showed no significant immunophenotypic abnormality.  Differential mainly shows neutrophilia and lymphocytosis and white count has been relatively stable between 11-15 over the last 4 years.  Patient has also been seeing me for her anemia and underwent EGD and colonoscopy with GI.  Colonoscopy showed 1 nonbleeding AVM which was treated with APC.Also had biopsies of duodenum and stomach.  Stomach biopsy showed intestinal metaplasia but otherwise negative for celiac dysplasia or malignancy. She also underwent heart valve surgery is on Coumadin.  She has received IV iron in the past   Interval history-history obtained with the help of sign language interpreter.  She is doing well overall and denies any specific complaints at this time.  Denies any blood loss in her stool or urine.  She does not take oral iron consistently  ECOG PS- 1 Pain scale- 0   Review of systems- Review of Systems  Constitutional:  Negative for chills, fever, malaise/fatigue and weight loss.  HENT:  Negative for congestion, ear discharge and nosebleeds.   Eyes:  Negative for blurred vision.  Respiratory:  Negative for cough, hemoptysis, sputum production, shortness of breath and  wheezing.   Cardiovascular:  Negative for chest pain, palpitations, orthopnea and claudication.  Gastrointestinal:  Negative for abdominal pain, blood in stool, constipation, diarrhea, heartburn, melena, nausea and vomiting.  Genitourinary:  Negative for dysuria, flank pain, frequency, hematuria and urgency.  Musculoskeletal:  Negative for back pain, joint pain and myalgias.  Skin:  Negative for rash.  Neurological:  Negative for dizziness, tingling, focal weakness, seizures, weakness and headaches.  Endo/Heme/Allergies:  Does not bruise/bleed easily.  Psychiatric/Behavioral:  Negative for depression and suicidal ideas. The patient does not have insomnia.       No Known Allergies   Past Medical History:  Diagnosis Date   Asthma    Deafness    Depression    Diabetes mellitus without complication (HCC)    Family history of adverse reaction to anesthesia    Mother - PONV   GERD (gastroesophageal reflux disease)    Heart abnormality    per mother pt has bad heart murmor- sees Dr Lady Gary    Hyperlipidemia    Hypertension    Mechanical heart valve present    PONV (postoperative nausea and vomiting)    Presence of permanent cardiac pacemaker    Staring episodes      Past Surgical History:  Procedure Laterality Date   ABDOMINAL HYSTERECTOMY  2000   per mother [facility] thought pt cancer and did hyster ,cervix and ovaries   CARDIAC VALVE REPLACEMENT     CATARACT EXTRACTION W/PHACO Left 02/14/2021   Procedure: CATARACT EXTRACTION PHACO AND INTRAOCULAR LENS PLACEMENT (IOC) Left DIABETIC;  Surgeon: Galen Manila, MD;  Location: [facility];  Service: Ophthalmology;  Laterality: Left;  leave arrival at 9 sign language interpreter has been  requested  kp 6.12 00:39.2   CHOLECYSTECTOMY     COLONOSCOPY WITH PROPOFOL N/A 08/17/2016   Procedure: COLONOSCOPY WITH PROPOFOL;  Surgeon: Scot Jun, MD;  Location: [facility] ENDOSCOPY;  Service: Endoscopy;  Laterality: N/A;   COLONOSCOPY  WITH PROPOFOL N/A 02/02/2020   Procedure: COLONOSCOPY WITH PROPOFOL;  Surgeon: Toney Reil, MD;  Location: [facility] ENDOSCOPY;  Service: Gastroenterology;  Laterality: N/A;   ESOPHAGOGASTRODUODENOSCOPY (EGD) WITH PROPOFOL N/A 08/17/2016   Procedure: ESOPHAGOGASTRODUODENOSCOPY (EGD) WITH PROPOFOL;  Surgeon: Scot Jun, MD;  Location[facility] ENDOSCOPY;  Service: Endoscopy;  Laterality: N/A;   ESOPHAGOGASTRODUODENOSCOPY (EGD) WITH PROPOFOL  02/02/2020   Procedure: ESOPHAGOGASTRODUODENOSCOPY (EGD) WITH PROPOFOL;  Surgeon: Toney Reil, MD;  Location: [facility] ENDOSCOPY;  Service: Gastroenterology;;   PACEMAKER IMPLANT  01/2018    Social History   Socioeconomic History   Marital status: Single    Spouse name: Not on file   Number of children: Not on file   Years of education: Not on file   Highest education level: Not on file  Occupational History   Not on file  Tobacco Use   Smoking status: Former    Current packs/day: 0.00    Average packs/day: 1.5 packs/day for 39.0 years (58.5 ttl pk-yrs)    Types: Cigarettes    Start date: 02/14/1979    Quit date: 02/13/2018    Years since quitting: 4.7   Smokeless tobacco: Never  Vaping Use   Vaping status: Former  Substance and Sexual Activity   Alcohol use: Not Currently    Comment: occasional wine   Drug use: Yes    Types: Marijuana    Comment: smoked 3 yr ago,for 2 y. Hx of cocaine use    Sexual activity: Never  Other Topics Concern   Not on file  Social History Narrative   Not on file   Social Determinants of Health   Financial Resource Strain: Not on file  Food Insecurity: No Food Insecurity (10/09/2022)   Hunger Vital Sign    Worried About Running Out of Food in the Last Year: Never true    Ran Out of Food in the Last Year: Never true  Transportation Needs: Unmet Transportation Needs (10/07/2022)   PRAPARE - Administrator, Civil Service (Medical): Yes    Lack of Transportation (Non-Medical): No   Physical Activity: Not on file  Stress: Not on file  Social Connections: Not on file  Intimate Partner Violence: Not At Risk (10/07/2022)   Humiliation, Afraid, Rape, and Kick questionnaire    Fear of Current or Ex-Partner: No    Emotionally Abused: No    Physically Abused: No    Sexually Abused: No    Family History  Problem Relation Age of Onset   Diabetes Maternal Uncle    Heart attack Father    Kidney cancer Paternal Uncle    Breast cancer Neg Hx    Bladder Cancer Neg Hx      Current Outpatient Medications:    acetaminophen (TYLENOL) 500 MG tablet, Take 1,000 mg by mouth every 6 (six) hours as needed for moderate pain., Disp: , Rfl:    amitriptyline (ELAVIL) 25 MG tablet, Take 25 mg by mouth at bedtime., Disp: , Rfl:    atorvastatin (LIPITOR) 40 MG tablet, Take 40 mg by mouth at bedtime., Disp: , Rfl:    dextromethorphan-guaiFENesin (MUCINEX DM) 30-600 MG 12hr tablet, Take 1 tablet by mouth 2 (two) times daily as needed for cough., Disp: 30 tablet, Rfl: 0  Dulaglutide 0.75 MG/0.5ML SOPN, Inject 0.75 mg into the skin once a week., Disp: , Rfl:    HYDROcodone bit-homatropine (HYCODAN) 5-1.5 MG/5ML syrup, Take 5 mLs by mouth every 8 (eight) hours as needed for cough., Disp: 120 mL, Rfl: 0   lamoTRIgine (LAMICTAL) 100 MG tablet, Take 100 mg by mouth 2 (two) times daily., Disp: , Rfl:    metFORMIN (GLUCOPHAGE-XR) 500 MG 24 hr tablet, Take 1,000 mg by mouth 2 (two) times daily., Disp: , Rfl:    mirabegron ER (MYRBETRIQ) 50 MG TB24 tablet, Take 1 tablet (50 mg total) by mouth daily., Disp: 30 tablet, Rfl: 0   omeprazole (PRILOSEC) 40 MG capsule, Take 40 mg by mouth daily., Disp: , Rfl:    warfarin (COUMADIN) 1 MG tablet, Take 1 mg by mouth daily. (Take with 3mg  tablet to equal 4mg  total), Disp: , Rfl:    warfarin (COUMADIN) 3 MG tablet, Take 3 mg by mouth daily. (Take with 1mg  tablet to equal 4mg  total), Disp: , Rfl:    potassium chloride SA (KLOR-CON M) 20 MEQ tablet, Take 1 tablet  (20 mEq total) by mouth 2 (two) times daily for 5 days., Disp: 10 tablet, Rfl: 0  Physical exam:  Vitals:   11/07/22 1353  BP: 135/61  Pulse: 78  Resp: 18  Temp: (!) 96.7 F (35.9 C)  TempSrc: Tympanic  SpO2: 100%  Weight: 123 lb 14.4 oz (56.2 kg)  Height: 5\' 6"  (1.676 m)   Physical Exam Cardiovascular:     Rate and Rhythm: Normal rate and regular rhythm.  Pulmonary:     Effort: Pulmonary effort is normal.     Breath sounds: Normal breath sounds.  Skin:    General: Skin is warm and dry.  Neurological:     Mental Status: She is alert and oriented to person, place, and time.         Latest Ref Rng & Units 10/08/2022    6:18 AM  CMP  Glucose 70 - 99 mg/dL 353   BUN 6 - 20 mg/dL 11   Creatinine 6.14 - 1.00 mg/dL 4.31   Sodium 540 - 086 mmol/L 140   Potassium 3.5 - 5.1 mmol/L 3.9   Chloride 98 - 111 mmol/L 105   CO2 22 - 32 mmol/L 26   Calcium 8.9 - 10.3 mg/dL 8.9       Latest Ref Rng & Units 11/07/2022    1:34 PM  CBC  WBC 4.0 - 10.5 K/uL 13.0   Hemoglobin 12.0 - 15.0 g/dL 76.1   Hematocrit 95.0 - 46.0 % 40.9   Platelets 150 - 400 K/uL 338     No images are attached to the encounter.  MM 3D SCREENING MAMMOGRAM BILATERAL BREAST  Result Date: 10/22/2022 CLINICAL DATA:  Screening. EXAM: DIGITAL SCREENING BILATERAL MAMMOGRAM WITH TOMOSYNTHESIS AND CAD TECHNIQUE: Bilateral screening digital craniocaudal and mediolateral oblique mammograms were obtained. Bilateral screening digital breast tomosynthesis was performed. The images were evaluated with computer-aided detection. COMPARISON:  Previous exam(s). ACR Breast Density Category b: There are scattered areas of fibroglandular density. FINDINGS: There are no findings suspicious for malignancy. IMPRESSION: No mammographic evidence of malignancy. A result letter of this screening mammogram will be mailed directly to the patient. RECOMMENDATION: Screening mammogram in one year. (Code:SM-B-01Y) BI-RADS CATEGORY  1: Negative.  Electronically Signed   By: Amie Portland M.D.   On: 10/22/2022 14:07     Assessment and plan- Patient is a 62 y.o. female who is here for routine  follow-up of iron deficiency anemia  Patient is not presently anemic with a hemoglobin of 13.6/40.9.  Ferritin levels are presently normal at 167.  Iron saturation normal at 22%.  She does not require any IV iron at this time.  She has mild chronic leukocytosis/neutrophilia which is likely reactive secondary to smoking.  Will repeat CBC ferritin iron studies in 6 months in 1 year and see her back in 1 year   Visit Diagnosis 1. Iron deficiency anemia, unspecified iron deficiency anemia type   2. Neutrophilia      Dr. Owens Shark, MD, MPH [facility] [phone] 11/08/2022 11:35 AM

## 2022-11-11 ENCOUNTER — Encounter: Payer: Self-pay | Admitting: Urology

## 2022-11-12 DIAGNOSIS — I10 Essential (primary) hypertension: Secondary | ICD-10-CM | POA: Diagnosis not present

## 2022-11-12 DIAGNOSIS — I442 Atrioventricular block, complete: Secondary | ICD-10-CM | POA: Diagnosis not present

## 2022-11-12 DIAGNOSIS — E782 Mixed hyperlipidemia: Secondary | ICD-10-CM | POA: Diagnosis not present

## 2022-11-12 DIAGNOSIS — R791 Abnormal coagulation profile: Secondary | ICD-10-CM | POA: Diagnosis not present

## 2022-11-12 DIAGNOSIS — Q231 Congenital insufficiency of aortic valve: Secondary | ICD-10-CM | POA: Diagnosis not present

## 2022-11-15 DIAGNOSIS — Z7901 Long term (current) use of anticoagulants: Secondary | ICD-10-CM | POA: Diagnosis not present

## 2022-11-15 DIAGNOSIS — R300 Dysuria: Secondary | ICD-10-CM | POA: Diagnosis not present

## 2022-11-15 DIAGNOSIS — Z8639 Personal history of other endocrine, nutritional and metabolic disease: Secondary | ICD-10-CM | POA: Diagnosis not present

## 2022-11-15 DIAGNOSIS — N390 Urinary tract infection, site not specified: Secondary | ICD-10-CM | POA: Diagnosis not present

## 2022-11-22 DIAGNOSIS — R791 Abnormal coagulation profile: Secondary | ICD-10-CM | POA: Diagnosis not present

## 2022-11-27 DIAGNOSIS — N390 Urinary tract infection, site not specified: Secondary | ICD-10-CM | POA: Diagnosis not present

## 2022-12-04 DIAGNOSIS — I350 Nonrheumatic aortic (valve) stenosis: Secondary | ICD-10-CM | POA: Diagnosis not present

## 2022-12-04 DIAGNOSIS — R001 Bradycardia, unspecified: Secondary | ICD-10-CM | POA: Diagnosis not present

## 2022-12-04 DIAGNOSIS — Z952 Presence of prosthetic heart valve: Secondary | ICD-10-CM | POA: Diagnosis not present

## 2022-12-06 DIAGNOSIS — R791 Abnormal coagulation profile: Secondary | ICD-10-CM | POA: Diagnosis not present

## 2022-12-10 NOTE — Progress Notes (Deleted)
12/11/2022 11:27 AM   Brianna Harris 06/21/1960 [id number]  Referring provider: Gracelyn Nurse, MD [address] [place],  [place] [address]  Urological history: 1. Urinary retention -in June 2024 after hospitalization for pneumonia - passed voiding trial  2. OAB  -contributing factors of age, GSM, alcohol consumption, smoking, hx of cocaine use, depression, diabetes and heart disease -cysto (10/2022) - NED  3. Mixed incontinence -contributing factors of age, GSM, alcohol consumption, smoking, hx of cocaine use, depression, diabetes, pelvic surgery and heart disease  No chief complaint on file.  HPI: Brianna Harris is a 62 y.o. female who presents today for one month follow up.   Previous records reviewed.   PVR ***  PMH: Past Medical History:  Diagnosis Date   Asthma    Deafness    Depression    Diabetes mellitus without complication (HCC)    Family history of adverse reaction to anesthesia    Mother - PONV   GERD (gastroesophageal reflux disease)    Heart abnormality    per mother pt has bad heart murmor- sees Dr Lady Gary    Hyperlipidemia    Hypertension    Mechanical heart valve present    PONV (postoperative nausea and vomiting)    Presence of permanent cardiac pacemaker    Staring episodes     Surgical History: Past Surgical History:  Procedure Laterality Date   ABDOMINAL HYSTERECTOMY  2000   per mother [facility] thought pt cancer and did hyster ,cervix and ovaries   CARDIAC VALVE REPLACEMENT     CATARACT EXTRACTION W/PHACO Left 02/14/2021   Procedure: CATARACT EXTRACTION PHACO AND INTRAOCULAR LENS PLACEMENT (IOC) Left DIABETIC;  Surgeon: Galen Manila, MD;  Location: [facility]GERY [facility];  Service: Ophthalmology;  Laterality: Left;  leave arrival at 9 sign language interpreter has been requested  kp 6.12 00:39.2   CHOLECYSTECTOMY     COLONOSCOPY WITH PROPOFOL N/A 08/17/2016   Procedure: COLONOSCOPY WITH PROPOFOL;  Surgeon: Scot Jun,  MD;  Location: [facility] ENDOSCOPY;  Service: Endoscopy;  Laterality: N/A;   COLONOSCOPY WITH PROPOFOL N/A 02/02/2020   Procedure: COLONOSCOPY WITH PROPOFOL;  Surgeon: Toney Reil, MD;  Location: [facility] ENDOSCOPY;  Service: Gastroenterology;  Laterality: N/A;   ESOPHAGOGASTRODUODENOSCOPY (EGD) WITH PROPOFOL N/A 08/17/2016   Procedure: ESOPHAGOGASTRODUODENOSCOPY (EGD) WITH PROPOFOL;  Surgeon: Scot Jun, MD;  Location: [facility] ENDOSCOPY;  Service: Endoscopy;  Laterality: N/A;   ESOPHAGOGASTRODUODENOSCOPY (EGD) WITH PROPOFOL  02/02/2020   Procedure: ESOPHAGOGASTRODUODENOSCOPY (EGD) WITH PROPOFOL;  Surgeon: Toney Reil, MD;  Location: [facility] ENDOSCOPY;  Service: Gastroenterology;;   PACEMAKER IMPLANT  01/2018    Home Medications:  Allergies as of 12/11/2022   No Known Allergies      Medication List        Accurate as of December 10, 2022 11:27 AM. If you have any questions, ask your nurse or doctor.          acetaminophen 500 MG tablet Commonly known as: TYLENOL Take 1,000 mg by mouth every 6 (six) hours as needed for moderate pain.   amitriptyline 25 MG tablet Commonly known as: ELAVIL Take 25 mg by mouth at bedtime.   atorvastatin 40 MG tablet Commonly known as: LIPITOR Take 40 mg by mouth at bedtime.   dextromethorphan-guaiFENesin 30-600 MG 12hr tablet Commonly known as: MUCINEX DM Take 1 tablet by mouth 2 (two) times daily as needed for cough.   Dulaglutide 0.75 MG/0.5ML Sopn Inject 0.75 mg into the skin once a week.  HYDROcodone bit-homatropine 5-1.5 MG/5ML syrup Commonly known as: HYCODAN Take 5 mLs by mouth every 8 (eight) hours as needed for cough.   lamoTRIgine 100 MG tablet Commonly known as: LAMICTAL Take 100 mg by mouth 2 (two) times daily.   metFORMIN 500 MG 24 hr tablet Commonly known as: GLUCOPHAGE-XR Take 1,000 mg by mouth 2 (two) times daily.   mirabegron ER 50 MG Tb24 tablet Commonly known as: Myrbetriq Take 1 tablet (50 mg total) by  mouth daily.   omeprazole 40 MG capsule Commonly known as: PRILOSEC Take 40 mg by mouth daily.   potassium chloride SA 20 MEQ tablet Commonly known as: KLOR-CON M Take 1 tablet (20 mEq total) by mouth 2 (two) times daily for 5 days.   warfarin 3 MG tablet Commonly known as: COUMADIN Take 3 mg by mouth daily. (Take with 1mg  tablet to equal 4mg  total)   warfarin 1 MG tablet Commonly known as: COUMADIN Take 1 mg by mouth daily. (Take with 3mg  tablet to equal 4mg  total)        Allergies: No Known Allergies  Family History: Family History  Problem Relation Age of Onset   Diabetes Maternal Uncle    Heart attack Father    Kidney cancer Paternal Uncle    Breast cancer Neg Hx    Bladder Cancer Neg Hx     Social History:  reports that she quit smoking about 4 years ago. Her smoking use included cigarettes. She started smoking about 43 years ago. She has a 58.5 pack-year smoking history. She has never used smokeless tobacco. She reports that she does not currently use alcohol. She reports current drug use. Drug: Marijuana.  ROS: Pertinent ROS in HPI  Physical Exam: There were no vitals taken for this visit.  Constitutional:  Well nourished. Alert and oriented, No acute distress. HEENT: [employer] AT, moist mucus membranes.  Trachea midline, no masses. Cardiovascular: No clubbing, cyanosis, or edema. Respiratory: Normal respiratory effort, no increased work of breathing. Neurologic: Grossly intact, no focal deficits, moving all 4 extremities. Psychiatric: Normal mood and affect.  Laboratory Data: Lab Results  Component Value Date   WBC 13.0 (H) 11/07/2022   HGB 13.6 11/07/2022   HCT 40.9 11/07/2022   MCV 89.7 11/07/2022   PLT 338 11/07/2022    Lab Results  Component Value Date   CREATININE 0.67 10/08/2022    Lab Results  Component Value Date   HGBA1C 5.5 10/05/2022    Lab Results  Component Value Date   AST 30 10/05/2022   Lab Results  Component Value Date   ALT  23 10/05/2022    Urinalysis Contains abnormal data Urinalysis w/Microscopic Order: [id number] Component Ref Range & Units 3 wk ago  Color Colorless, Straw, Light Yellow, Yellow, Dark Yellow Colorless  Clarity Clear Clear  Specific Gravity 1.005 - 1.030 1.009  pH, Urine 5.0 - 8.0 5.5  Protein, Urinalysis Negative mg/dL Negative  Glucose, Urinalysis Negative mg/dL Negative  Ketones, Urinalysis Negative mg/dL Negative  Blood, Urinalysis Negative Negative  Nitrite, Urinalysis Negative Negative  Leukocyte Esterase, Urinalysis Negative Trace Abnormal   Hyaline Casts, Urinalysis /lpf 1  Bilirubin, Urinalysis Negative Negative  Urobilinogen, Urinalysis 0.2 - 1.0 mg/dL 0.2  WBC, UA <=5 /hpf 2  Red Blood Cells, Urinalysis <=3 /hpf <1  Bacteria, Urinalysis 0 - 5 /hpf 5-50 Abnormal   Squamous Epithelial Cells, Urinalysis /hpf 1  Resulting Agency [facility]   Specimen Collected: 11/15/22 11:21   Performed by: [facility] Last Resulted: 11/15/22 12:04  Received From: [facility]  Result Received: 11/30/22 15:11  I have reviewed the labs.   Pertinent Imaging: ***  Assessment & Plan:  ***  1. OAB  - offered behavioral therapies  - bladder training  - bladder control strategies  - pelvic floor muscle training  - fluid management   - offered medical therapy with anticholinergic therapy or beta-3 adrenergic receptor agonist and the potential side effects of each therapy ***  - offered refer to gynecology for a pessary fitting ***  - offered an appointment with one of our surgeon for a possible pelvic sling procedure ***  - would like to try the beta-3 adrenergic receptor agonist (Myrbetriq).  Given Myrbetriq 25 mg samples, #28.  I have reviewed with the patient of the side effects of Myrbetriq, such as: elevation in BP, urinary retention and/or HA.  She will return in one month for PVR and symptom recheck.  ***  - would  like to try anticholinergic therapy.  Given Vesicare 5mg /10mg , Toviaz 4mg /8mg  samples, # 28.   Advised of the side effects, such as: Dry eyes, dry mouth, constipation, mental confusion and/or urinary retention. ***  - RTC in 3 weeks for PVR and symptom recheck ***  2. Mixed incontinence ***    No follow-ups on file.  These notes generated with voice recognition software. I apologize for typographical errors.  Cloretta Ned  [facility] [address]  [address] [place], [place] [address] [phone]

## 2022-12-11 ENCOUNTER — Ambulatory Visit: Payer: PPO | Admitting: Urology

## 2022-12-11 DIAGNOSIS — N3946 Mixed incontinence: Secondary | ICD-10-CM

## 2022-12-11 DIAGNOSIS — N3281 Overactive bladder: Secondary | ICD-10-CM

## 2022-12-13 DIAGNOSIS — R791 Abnormal coagulation profile: Secondary | ICD-10-CM | POA: Diagnosis not present

## 2022-12-19 ENCOUNTER — Other Ambulatory Visit: Payer: Self-pay

## 2022-12-19 DIAGNOSIS — Z87891 Personal history of nicotine dependence: Secondary | ICD-10-CM

## 2022-12-19 DIAGNOSIS — Z122 Encounter for screening for malignant neoplasm of respiratory organs: Secondary | ICD-10-CM

## 2022-12-20 DIAGNOSIS — R791 Abnormal coagulation profile: Secondary | ICD-10-CM | POA: Diagnosis not present

## 2022-12-27 DIAGNOSIS — R791 Abnormal coagulation profile: Secondary | ICD-10-CM | POA: Diagnosis not present

## 2022-12-28 ENCOUNTER — Ambulatory Visit
Admission: RE | Admit: 2022-12-28 | Discharge: 2022-12-28 | Disposition: A | Discharge status: Home / Self Care | Payer: PPO | Source: Ambulatory Visit | Attending: Acute Care | Admitting: Acute Care

## 2022-12-28 DIAGNOSIS — Z122 Encounter for screening for malignant neoplasm of respiratory organs: Secondary | ICD-10-CM | POA: Diagnosis not present

## 2022-12-28 DIAGNOSIS — Z87891 Personal history of nicotine dependence: Secondary | ICD-10-CM | POA: Insufficient documentation

## 2023-01-03 DIAGNOSIS — E1165 Type 2 diabetes mellitus with hyperglycemia: Secondary | ICD-10-CM | POA: Diagnosis not present

## 2023-01-03 DIAGNOSIS — R791 Abnormal coagulation profile: Secondary | ICD-10-CM | POA: Diagnosis not present

## 2023-01-09 ENCOUNTER — Other Ambulatory Visit: Payer: Self-pay

## 2023-01-09 DIAGNOSIS — Z122 Encounter for screening for malignant neoplasm of respiratory organs: Secondary | ICD-10-CM

## 2023-01-09 DIAGNOSIS — Z87891 Personal history of nicotine dependence: Secondary | ICD-10-CM

## 2023-01-11 DIAGNOSIS — J4520 Mild intermittent asthma, uncomplicated: Secondary | ICD-10-CM | POA: Diagnosis not present

## 2023-01-11 DIAGNOSIS — E1165 Type 2 diabetes mellitus with hyperglycemia: Secondary | ICD-10-CM | POA: Diagnosis not present

## 2023-01-11 DIAGNOSIS — I10 Essential (primary) hypertension: Secondary | ICD-10-CM | POA: Diagnosis not present

## 2023-01-11 DIAGNOSIS — E7801 Familial hypercholesterolemia: Secondary | ICD-10-CM | POA: Diagnosis not present

## 2023-01-11 DIAGNOSIS — Z23 Encounter for immunization: Secondary | ICD-10-CM | POA: Diagnosis not present

## 2023-01-11 DIAGNOSIS — F32A Depression, unspecified: Secondary | ICD-10-CM | POA: Diagnosis not present

## 2023-01-11 DIAGNOSIS — I350 Nonrheumatic aortic (valve) stenosis: Secondary | ICD-10-CM | POA: Diagnosis not present

## 2023-01-16 ENCOUNTER — Other Ambulatory Visit: Payer: Self-pay | Admitting: Neurology

## 2023-01-16 DIAGNOSIS — R4182 Altered mental status, unspecified: Secondary | ICD-10-CM

## 2023-01-17 DIAGNOSIS — Z952 Presence of prosthetic heart valve: Secondary | ICD-10-CM | POA: Diagnosis not present

## 2023-01-17 DIAGNOSIS — D2310 Other benign neoplasm of skin of unspecified eyelid, including canthus: Secondary | ICD-10-CM | POA: Diagnosis not present

## 2023-01-21 ENCOUNTER — Ambulatory Visit
Admission: RE | Admit: 2023-01-21 | Discharge: 2023-01-21 | Disposition: A | Discharge status: Home / Self Care | Payer: PPO | Source: Ambulatory Visit | Attending: Neurology | Admitting: Neurology

## 2023-01-21 DIAGNOSIS — R4182 Altered mental status, unspecified: Secondary | ICD-10-CM | POA: Diagnosis not present

## 2023-01-21 DIAGNOSIS — I6782 Cerebral ischemia: Secondary | ICD-10-CM | POA: Diagnosis not present

## 2023-01-23 DIAGNOSIS — R791 Abnormal coagulation profile: Secondary | ICD-10-CM | POA: Diagnosis not present

## 2023-02-07 DIAGNOSIS — R791 Abnormal coagulation profile: Secondary | ICD-10-CM | POA: Diagnosis not present

## 2023-02-11 DIAGNOSIS — R791 Abnormal coagulation profile: Secondary | ICD-10-CM | POA: Diagnosis not present

## 2023-02-14 DIAGNOSIS — R791 Abnormal coagulation profile: Secondary | ICD-10-CM | POA: Diagnosis not present

## 2023-02-18 DIAGNOSIS — R791 Abnormal coagulation profile: Secondary | ICD-10-CM | POA: Diagnosis not present

## 2023-02-19 ENCOUNTER — Emergency Department: Payer: PPO

## 2023-02-19 ENCOUNTER — Emergency Department
Admission: EM | Admit: 2023-02-19 | Discharge: 2023-02-19 | Disposition: A | Discharge status: Home / Self Care | Payer: PPO | Attending: Emergency Medicine | Admitting: Emergency Medicine

## 2023-02-19 ENCOUNTER — Other Ambulatory Visit: Payer: Self-pay

## 2023-02-19 DIAGNOSIS — R109 Unspecified abdominal pain: Secondary | ICD-10-CM | POA: Insufficient documentation

## 2023-02-19 DIAGNOSIS — M545 Low back pain, unspecified: Secondary | ICD-10-CM | POA: Insufficient documentation

## 2023-02-19 DIAGNOSIS — K5730 Diverticulosis of large intestine without perforation or abscess without bleeding: Secondary | ICD-10-CM | POA: Diagnosis not present

## 2023-02-19 DIAGNOSIS — M5459 Other low back pain: Secondary | ICD-10-CM | POA: Diagnosis not present

## 2023-02-19 LAB — URINALYSIS, ROUTINE W REFLEX MICROSCOPIC
Bilirubin Urine: NEGATIVE
Glucose, UA: NEGATIVE mg/dL
Hgb urine dipstick: NEGATIVE
Ketones, ur: NEGATIVE mg/dL
Leukocytes,Ua: NEGATIVE
Nitrite: NEGATIVE
Protein, ur: NEGATIVE mg/dL
Specific Gravity, Urine: 1.002 — ABNORMAL LOW (ref 1.005–1.030)
pH: 7 (ref 5.0–8.0)

## 2023-02-19 MED ORDER — LIDOCAINE 5 % EX PTCH: 1.0000 | MEDICATED_PATCH | CUTANEOUS | 0 refills | Status: AC

## 2023-02-19 MED ORDER — CYCLOBENZAPRINE HCL 10 MG PO TABS
5.0000 mg | ORAL_TABLET | Freq: Once | ORAL | Status: AC
  Administered 2023-02-19: 5 mg via ORAL
  Filled 2023-02-19 (×2): qty 1

## 2023-02-19 MED ORDER — LIDOCAINE 5 % EX PTCH
1.0000 | MEDICATED_PATCH | Freq: Once | CUTANEOUS | Status: DC
  Administered 2023-02-19: 1 via TRANSDERMAL
  Filled 2023-02-19: qty 1

## 2023-02-19 MED ORDER — CYCLOBENZAPRINE HCL 5 MG PO TABS: 5.0000 mg | ORAL_TABLET | Freq: Three times a day (TID) | ORAL | 0 refills | Status: DC | PRN

## 2023-02-19 NOTE — ED Provider Notes (Signed)
[facility] Provider Note    Event Date/Time   First MD Initiated Contact with Patient 02/19/23 0533     (approximate)   History   Back Pain  American sign language video interpreter services utilized for this visit.  HPI Brianna Harris is a 62 y.o. female who is deaf and has extensive chronic medical issues including but not limited to deafness, prior polysubstance use, diabetes, and pacemaker and mechanical heart valves on warfarin.  She presents for evaluation of pain in the lower right side of her back.  She reports that it started a few days ago after carrying a 24 pack of water as well as using a leaf blower outside.  However the pain seems to come and go and was extremely severe tonight requiring her partner to bring her to the emergency department.  She has had no burning when she urinates and no blood in her urine.  She has no history of kidney stones.  It is not hurting as much now but it hurts if she pushes on the area.  She has had no nausea or vomiting.  No chest pain or shortness of breath.     Physical Exam   Triage Vital Signs: ED Triage Vitals  Encounter Vitals Group     BP 02/19/23 0416 (!) 150/75     Systolic BP Percentile --      Diastolic BP Percentile --      Pulse Rate 02/19/23 0416 92     Resp 02/19/23 0416 18     Temp 02/19/23 0416 97.9 F (36.6 C)     Temp Source 02/19/23 0416 Oral     SpO2 02/19/23 0416 98 %     Weight 02/19/23 0417 59 kg (130 lb)     Height 02/19/23 0417 1.575 m (5\' 2" )     Head Circumference --      Peak Flow --      Pain Score 02/19/23 0417 7     Pain Loc --      Pain Education --      Exclude from Growth Chart --     Most recent vital signs: Vitals:   02/19/23 0416 02/19/23 0815  BP: (!) 150/75 (!) 157/73  Pulse: 92 81  Resp: 18 20  Temp: 97.9 F (36.6 C) 98.6 F (37 C)  SpO2: 98% 100%    General: Awake, no obvious distress. CV:  Good peripheral perfusion.  Regular rate and  rhythm. Resp:  Normal effort. no accessory muscle usage nor intercostal retractions.   Abd:  No distention.  No tenderness to palpation of the abdomen. Other:  No tenderness to palpation of the thoracic or lumbar spine.  She has right flank tenderness to percussion although it is a bit more inferior than 1 would typically associate with "flank pain".  It is tender to manipulation of the soft tissues/muscles in that area.   ED Results / Procedures / Treatments   Labs (all labs ordered are listed, but only abnormal results are displayed) Labs Reviewed  URINALYSIS, ROUTINE W REFLEX MICROSCOPIC - Abnormal; Notable for the following components:      Result Value   Color, Urine COLORLESS (*)    APPearance CLEAR (*)    Specific Gravity, Urine 1.002 (*)    All other components within normal limits     RADIOLOGY CT renal stone protocol pending at time of transfer of care to Dr. Anner Crete.   PROCEDURES:  Critical Care performed: No  Procedures    IMPRESSION / MDM / ASSESSMENT AND PLAN / ED COURSE  I reviewed the triage vital signs and the nursing notes.                              Differential diagnosis includes, but is not limited to, musculoskeletal strain, herniated disc, renal/ureteral colic.  Patient's presentation is most consistent with acute complicated illness / injury requiring diagnostic workup.  Labs/studies ordered: Urinalysis, CT renal stone study  Interventions/Medications given:    (Note:  hospital course my include additional interventions and/or labs/studies not listed above.)   Vital signs stable.  Patient is much more comfortable now than she was previously.  Urinalysis is negative.  Given the location and nature of the pain, I believe it is reasonable to evaluate with a CT renal stone study to rule out ureteral colic.  However the patient understands that if the CT scan is negative for stones, this is most likely musculoskeletal and we will treat thusly.  She  does not require medication at this time.     Clinical Course as of 02/19/23 1100  Tue Feb 19, 2023  0700 Transferring ED care to Dr. Anner Crete to follow-up on the CT results and dispo the patient appropriately [CF]    Clinical Course User Index [CF] Loleta Rose, MD     FINAL CLINICAL IMPRESSION(S) / ED DIAGNOSES   Final diagnoses:  Acute right-sided low back pain without sciatica     Rx / DC Orders       Note:  This document was prepared using Dragon voice recognition software and may include unintentional dictation errors.   Loleta Rose, MD 02/19/23 1100

## 2023-02-19 NOTE — ED Notes (Signed)
ASL interpreter [phone] assisted. Called into room. Pt sleeping. Verbalizes ready for d/c, wants coffee. Granted, in progress.

## 2023-02-19 NOTE — ED Triage Notes (Signed)
Pt to ED via POV c/o right mid back pain. Pt says it started yesterday afternoon. Pt denies any injuries or falls, the only thing she can think of is that she picked up a case if water bottles a couple days ago.

## 2023-02-19 NOTE — ED Notes (Signed)
D/c with [employer] video ASL interpreter [place]

## 2023-02-19 NOTE — ED Notes (Signed)
ASL interpreter Mel assisted. D/c delayed d/t ASL interpreter, pt needing to use b/r, pt dropped med. Pt frustrated with using bedside commode/ room toilet, but could not wait for w/c. Family at [facility]

## 2023-02-21 DIAGNOSIS — R791 Abnormal coagulation profile: Secondary | ICD-10-CM | POA: Diagnosis not present

## 2023-02-26 DIAGNOSIS — E119 Type 2 diabetes mellitus without complications: Secondary | ICD-10-CM | POA: Diagnosis not present

## 2023-02-26 DIAGNOSIS — H43813 Vitreous degeneration, bilateral: Secondary | ICD-10-CM | POA: Diagnosis not present

## 2023-02-26 DIAGNOSIS — H2511 Age-related nuclear cataract, right eye: Secondary | ICD-10-CM | POA: Diagnosis not present

## 2023-02-26 DIAGNOSIS — H40053 Ocular hypertension, bilateral: Secondary | ICD-10-CM | POA: Diagnosis not present

## 2023-02-28 DIAGNOSIS — Z952 Presence of prosthetic heart valve: Secondary | ICD-10-CM | POA: Diagnosis not present

## 2023-03-06 DIAGNOSIS — E7801 Familial hypercholesterolemia: Secondary | ICD-10-CM | POA: Diagnosis not present

## 2023-03-06 DIAGNOSIS — I10 Essential (primary) hypertension: Secondary | ICD-10-CM | POA: Diagnosis not present

## 2023-03-06 DIAGNOSIS — Z952 Presence of prosthetic heart valve: Secondary | ICD-10-CM | POA: Diagnosis not present

## 2023-03-14 DIAGNOSIS — E119 Type 2 diabetes mellitus without complications: Secondary | ICD-10-CM | POA: Diagnosis not present

## 2023-03-14 DIAGNOSIS — D485 Neoplasm of uncertain behavior of skin: Secondary | ICD-10-CM | POA: Diagnosis not present

## 2023-03-19 DIAGNOSIS — E119 Type 2 diabetes mellitus without complications: Secondary | ICD-10-CM | POA: Diagnosis not present

## 2023-03-19 DIAGNOSIS — Z952 Presence of prosthetic heart valve: Secondary | ICD-10-CM | POA: Diagnosis not present

## 2023-03-25 DIAGNOSIS — R791 Abnormal coagulation profile: Secondary | ICD-10-CM | POA: Diagnosis not present

## 2023-04-04 DIAGNOSIS — Z952 Presence of prosthetic heart valve: Secondary | ICD-10-CM | POA: Diagnosis not present

## 2023-04-10 DIAGNOSIS — R399 Unspecified symptoms and signs involving the genitourinary system: Secondary | ICD-10-CM | POA: Diagnosis not present

## 2023-04-10 DIAGNOSIS — Z952 Presence of prosthetic heart valve: Secondary | ICD-10-CM | POA: Diagnosis not present

## 2023-04-26 DIAGNOSIS — E1165 Type 2 diabetes mellitus with hyperglycemia: Secondary | ICD-10-CM | POA: Diagnosis not present

## 2023-04-26 DIAGNOSIS — Z952 Presence of prosthetic heart valve: Secondary | ICD-10-CM | POA: Diagnosis not present

## 2023-05-01 DIAGNOSIS — F32A Depression, unspecified: Secondary | ICD-10-CM | POA: Diagnosis not present

## 2023-05-01 DIAGNOSIS — R4182 Altered mental status, unspecified: Secondary | ICD-10-CM | POA: Diagnosis not present

## 2023-05-01 DIAGNOSIS — F329 Major depressive disorder, single episode, unspecified: Secondary | ICD-10-CM | POA: Diagnosis not present

## 2023-05-01 DIAGNOSIS — K760 Fatty (change of) liver, not elsewhere classified: Secondary | ICD-10-CM | POA: Diagnosis not present

## 2023-05-01 DIAGNOSIS — K219 Gastro-esophageal reflux disease without esophagitis: Secondary | ICD-10-CM | POA: Diagnosis not present

## 2023-05-01 DIAGNOSIS — I10 Essential (primary) hypertension: Secondary | ICD-10-CM | POA: Diagnosis not present

## 2023-05-01 DIAGNOSIS — D509 Iron deficiency anemia, unspecified: Secondary | ICD-10-CM | POA: Diagnosis not present

## 2023-05-01 DIAGNOSIS — E7801 Familial hypercholesterolemia: Secondary | ICD-10-CM | POA: Diagnosis not present

## 2023-05-01 DIAGNOSIS — E1165 Type 2 diabetes mellitus with hyperglycemia: Secondary | ICD-10-CM | POA: Diagnosis not present

## 2023-05-01 DIAGNOSIS — Z952 Presence of prosthetic heart valve: Secondary | ICD-10-CM | POA: Diagnosis not present

## 2023-05-08 ENCOUNTER — Other Ambulatory Visit: Payer: Self-pay | Admitting: Neurology

## 2023-05-08 DIAGNOSIS — R4182 Altered mental status, unspecified: Secondary | ICD-10-CM

## 2023-05-08 DIAGNOSIS — F329 Major depressive disorder, single episode, unspecified: Secondary | ICD-10-CM

## 2023-05-09 DIAGNOSIS — Z7901 Long term (current) use of anticoagulants: Secondary | ICD-10-CM | POA: Diagnosis not present

## 2023-05-09 DIAGNOSIS — Z952 Presence of prosthetic heart valve: Secondary | ICD-10-CM | POA: Diagnosis not present

## 2023-05-10 ENCOUNTER — Inpatient Hospital Stay: Payer: PPO | Attending: Oncology

## 2023-05-10 DIAGNOSIS — D509 Iron deficiency anemia, unspecified: Secondary | ICD-10-CM | POA: Diagnosis not present

## 2023-05-10 LAB — IRON AND TIBC
Iron: 74 ug/dL (ref 28–170)
Saturation Ratios: 20 % (ref 10.4–31.8)
TIBC: 365 ug/dL (ref 250–450)
UIBC: 291 ug/dL

## 2023-05-10 LAB — CBC WITH DIFFERENTIAL/PLATELET
Abs Immature Granulocytes: 0.04 10*3/uL (ref 0.00–0.07)
Basophils Absolute: 0.1 10*3/uL (ref 0.0–0.1)
Basophils Relative: 1 %
Eosinophils Absolute: 0.4 10*3/uL (ref 0.0–0.5)
Eosinophils Relative: 4 %
HCT: 40.9 % (ref 36.0–46.0)
Hemoglobin: 13.7 g/dL (ref 12.0–15.0)
Immature Granulocytes: 0 %
Lymphocytes Relative: 36 %
Lymphs Abs: 3.9 10*3/uL (ref 0.7–4.0)
MCH: 30.3 pg (ref 26.0–34.0)
MCHC: 33.5 g/dL (ref 30.0–36.0)
MCV: 90.5 fL (ref 80.0–100.0)
Monocytes Absolute: 0.7 10*3/uL (ref 0.1–1.0)
Monocytes Relative: 6 %
Neutro Abs: 5.8 10*3/uL (ref 1.7–7.7)
Neutrophils Relative %: 53 %
Platelets: 325 10*3/uL (ref 150–400)
RBC: 4.52 MIL/uL (ref 3.87–5.11)
RDW: 13.2 % (ref 11.5–15.5)
WBC: 10.9 10*3/uL — ABNORMAL HIGH (ref 4.0–10.5)
nRBC: 0 % (ref 0.0–0.2)

## 2023-05-10 LAB — FERRITIN: Ferritin: 141 ng/mL (ref 11–307)

## 2023-05-14 ENCOUNTER — Ambulatory Visit
Admission: RE | Admit: 2023-05-14 | Discharge: 2023-05-14 | Disposition: A | Discharge status: Home / Self Care | Payer: PPO | Source: Ambulatory Visit | Attending: Neurology | Admitting: Neurology

## 2023-05-14 DIAGNOSIS — R4182 Altered mental status, unspecified: Secondary | ICD-10-CM

## 2023-05-14 DIAGNOSIS — F329 Major depressive disorder, single episode, unspecified: Secondary | ICD-10-CM | POA: Diagnosis not present

## 2023-05-14 DIAGNOSIS — G9389 Other specified disorders of brain: Secondary | ICD-10-CM | POA: Diagnosis not present

## 2023-05-14 DIAGNOSIS — Q2381 Bicuspid aortic valve: Secondary | ICD-10-CM | POA: Diagnosis not present

## 2023-05-14 DIAGNOSIS — Z952 Presence of prosthetic heart valve: Secondary | ICD-10-CM | POA: Diagnosis not present

## 2023-05-14 DIAGNOSIS — I10 Essential (primary) hypertension: Secondary | ICD-10-CM | POA: Diagnosis not present

## 2023-05-14 DIAGNOSIS — E7801 Familial hypercholesterolemia: Secondary | ICD-10-CM | POA: Diagnosis not present

## 2023-05-14 DIAGNOSIS — I6381 Other cerebral infarction due to occlusion or stenosis of small artery: Secondary | ICD-10-CM | POA: Diagnosis not present

## 2023-05-23 DIAGNOSIS — Z952 Presence of prosthetic heart valve: Secondary | ICD-10-CM | POA: Diagnosis not present

## 2023-05-28 DIAGNOSIS — I442 Atrioventricular block, complete: Secondary | ICD-10-CM | POA: Diagnosis not present

## 2023-05-28 DIAGNOSIS — Z952 Presence of prosthetic heart valve: Secondary | ICD-10-CM | POA: Diagnosis not present

## 2023-06-05 ENCOUNTER — Other Ambulatory Visit: Payer: Self-pay

## 2023-06-05 ENCOUNTER — Emergency Department
Admission: EM | Admit: 2023-06-05 | Discharge: 2023-06-05 | Disposition: A | Discharge status: Home / Self Care | Payer: PPO | Attending: Emergency Medicine | Admitting: Emergency Medicine

## 2023-06-05 DIAGNOSIS — J45909 Unspecified asthma, uncomplicated: Secondary | ICD-10-CM | POA: Insufficient documentation

## 2023-06-05 DIAGNOSIS — Z7901 Long term (current) use of anticoagulants: Secondary | ICD-10-CM | POA: Diagnosis not present

## 2023-06-05 DIAGNOSIS — R112 Nausea with vomiting, unspecified: Secondary | ICD-10-CM

## 2023-06-05 DIAGNOSIS — Z79899 Other long term (current) drug therapy: Secondary | ICD-10-CM | POA: Insufficient documentation

## 2023-06-05 DIAGNOSIS — I10 Essential (primary) hypertension: Secondary | ICD-10-CM | POA: Diagnosis not present

## 2023-06-05 DIAGNOSIS — F32A Depression, unspecified: Secondary | ICD-10-CM

## 2023-06-05 DIAGNOSIS — R45851 Suicidal ideations: Secondary | ICD-10-CM

## 2023-06-05 DIAGNOSIS — E119 Type 2 diabetes mellitus without complications: Secondary | ICD-10-CM | POA: Diagnosis not present

## 2023-06-05 LAB — PROTIME-INR
INR: 2.9 — ABNORMAL HIGH (ref 0.8–1.2)
Prothrombin Time: 30.3 s — ABNORMAL HIGH (ref 11.4–15.2)

## 2023-06-05 LAB — URINE DRUG SCREEN, QUALITATIVE (ARMC ONLY)
Amphetamines, Ur Screen: NOT DETECTED
Barbiturates, Ur Screen: NOT DETECTED
Benzodiazepine, Ur Scrn: NOT DETECTED
Cannabinoid 50 Ng, Ur ~~LOC~~: NOT DETECTED
Cocaine Metabolite,Ur ~~LOC~~: NOT DETECTED
MDMA (Ecstasy)Ur Screen: NOT DETECTED
Methadone Scn, Ur: NOT DETECTED
Opiate, Ur Screen: NOT DETECTED
Phencyclidine (PCP) Ur S: NOT DETECTED
Tricyclic, Ur Screen: POSITIVE — AB

## 2023-06-05 LAB — COMPREHENSIVE METABOLIC PANEL
ALT: 19 U/L (ref 0–44)
AST: 24 U/L (ref 15–41)
Albumin: 4.7 g/dL (ref 3.5–5.0)
Alkaline Phosphatase: 75 U/L (ref 38–126)
Anion gap: 13 (ref 5–15)
BUN: 20 mg/dL (ref 6–20)
CO2: 26 mmol/L (ref 22–32)
Calcium: 9.8 mg/dL (ref 8.9–10.3)
Chloride: 96 mmol/L — ABNORMAL LOW (ref 98–111)
Creatinine, Ser: 0.86 mg/dL (ref 0.44–1.00)
GFR, Estimated: >60 mL/min (ref >60)
Glucose, Bld: 109 mg/dL — ABNORMAL HIGH (ref 70–99)
Potassium: 3.7 mmol/L (ref 3.5–5.1)
Sodium: 135 mmol/L (ref 135–145)
Total Bilirubin: 1.8 mg/dL — ABNORMAL HIGH (ref 0.0–1.2)
Total Protein: 7.5 g/dL (ref 6.5–8.1)

## 2023-06-05 LAB — URINALYSIS, ROUTINE W REFLEX MICROSCOPIC
Bacteria, UA: NONE SEEN
Bilirubin Urine: NEGATIVE
Glucose, UA: NEGATIVE mg/dL
Hgb urine dipstick: NEGATIVE
Ketones, ur: NEGATIVE mg/dL
Nitrite: NEGATIVE
Protein, ur: 30 mg/dL — AB
Specific Gravity, Urine: 1.012 (ref 1.005–1.030)
pH: 5 (ref 5.0–8.0)

## 2023-06-05 LAB — CBC
HCT: 43.1 % (ref 36.0–46.0)
Hemoglobin: 14.6 g/dL (ref 12.0–15.0)
MCH: 29.8 pg (ref 26.0–34.0)
MCHC: 33.9 g/dL (ref 30.0–36.0)
MCV: 88 fL (ref 80.0–100.0)
Platelets: 338 10*3/uL (ref 150–400)
RBC: 4.9 MIL/uL (ref 3.87–5.11)
RDW: 12.9 % (ref 11.5–15.5)
WBC: 13.9 10*3/uL — ABNORMAL HIGH (ref 4.0–10.5)
nRBC: 0 % (ref 0.0–0.2)

## 2023-06-05 LAB — ACETAMINOPHEN LEVEL: Acetaminophen (Tylenol), Serum: <10 ug/mL — ABNORMAL LOW (ref 10–30)

## 2023-06-05 LAB — SALICYLATE LEVEL: Salicylate Lvl: <7 mg/dL — ABNORMAL LOW (ref 7.0–30.0)

## 2023-06-05 LAB — LIPASE, BLOOD: Lipase: 43 U/L (ref 11–51)

## 2023-06-05 MED ORDER — ONDANSETRON 4 MG PO TBDP
4.0000 mg | ORAL_TABLET | Freq: Once | ORAL | Status: AC
  Administered 2023-06-05: 4 mg via ORAL
  Filled 2023-06-05: qty 1

## 2023-06-05 NOTE — ED Triage Notes (Signed)
Pt here with vomiting that started today. Pt denies abd pain, cp, or SOB. Pt endorses diarrhea at times. Pt denies SI at this time.  Pt is deaf and needs an interpreter.

## 2023-06-05 NOTE — ED Notes (Addendum)
[facility] called and informed this nurse pt is wearing scrub bottoms from [facility]. They are requesting that when pt changes her clothing the scub pants are set aside and [facility] called at [phone] so staff can retreive them.

## 2023-06-05 NOTE — ED Notes (Signed)
Pt escorted to interview room with ASL interpreter for evaluation with TTS.

## 2023-06-05 NOTE — Consult Note (Signed)
[facility] Psychiatric Consult Initial  Patient Name: .Brianna Harris  MRN: [id number]  DOB: 09-09-1960  Consult Order details:  Orders (From admission, onward)     Start     Ordered   06/05/23 1347  IP CONSULT TO PSYCHIATRY       Ordering Provider: Chesley Noon, MD  Provider:  (Not yet assigned)  Question Answer Comment  Place call to: [phone]   Reason for Consult Admit      06/05/23 1346   06/05/23 1347  CONSULT TO CALL ACT TEAM       Ordering Provider: Chesley Noon, MD  Provider:  (Not yet assigned)  Question:  Reason for Consult?  Answer:  SI   06/05/23 1346            Mode of Visit: In person   Psychiatry Consult Evaluation  Service Date: June 05, 2023 LOS:  LOS: 0 days  Chief Complaint nausea and vomiting  Primary Psychiatric Diagnoses  Depression  Assessment  Brianna Harris is a 63 y.o. female history of hypertension, hyperlipidemia, diabetes, and artificial heart valve on Coumadin, crack/cocaine use, tobacco abuse, alcohol abuse, depression who presents to [facility] emergency department on 06/05/2023 with complaint of nausea and vomiting.  Patient is deaf, and sign language interpreters are used Maralyn Sago [phone], Marylu Lund [id number], Bethann Berkshire [phone].  Patient presents with her friend, [name].  She prefers [name] remain during the assessment.  On assessment, patient endorses worsening depression.  She denies active or passive suicidal ideations, plan or intent.  She adamantly denies she is a danger to herself and notes that she is actively seeking help.  She is currently connected at [facility] for counseling services and just started psychiatric services there today during which she was started on an antidepressant. She is supposed to pick up the antidepressant from the pharmacy today. She cannot recall the antidepressant name or dosage. Patient's friend and roommate, [name], is present with the patient throughout the assessment.  [name] is advocating against an inpatient  psychiatric admission.  Patient would also like to be discharged today. [name] states while she does believe patient is depressed, she does not feel patient is a danger to herself or anybody else. Patient presents with possible fixed delusion/tactile hallucinations that she has been experiencing "vibrations" from a steel box her friend, Brianna Harris, has in [place] roommate's room.  She states she has been experiencing these "vibrations" for the past year. She states she last experienced these "vibrations " yesterday, although typically experiences them daily. When reality testing, patient does understand that this is likely not true.  On assessment, patient does not appear to be responding to internal stimuli.  There is no evidence of agitation, aggression, internal preoccupation, distractibility.  She is able to coherently attend to and participate in the assessment.   Diagnoses:  Active Hospital problems: Principal Problem:   Depression   Plan   ## Psychiatric Medication Recommendations:  -- Follow-up with outpatient services at [facility]  ## Medical Decision Making Capacity: Not specifically addressed in this encounter  ## Further Work-up:  -- Defer to primary team -- UDS + tricyclic  ## Disposition:-- There are no psychiatric contraindications to discharge at this time  ## Behavioral / Environmental: -Utilize compassion and acknowledge the patient's experiences while setting clear and realistic expectations for care.  ## Safety and Observation Level:  - Based on my clinical evaluation, I estimate the patient to be at LOW risk of self harm in the current setting. - At this  time, we recommend  routine safety precautions. This decision is based on my review of the chart including patient's history and current presentation, interview of the patient, mental status examination, and consideration of suicide risk including evaluating suicidal ideation, plan, intent, suicidal or self-harm behaviors, risk  factors, and protective factors. This judgment is based on our ability to directly address suicide risk, implement suicide prevention strategies, and develop a safety plan while the patient is in the clinical setting. Please contact our team if there is a concern that risk level has changed.  CSSR Risk Category:C-SSRS RISK CATEGORY: No Risk  Suicide Risk Assessment: Patient has following modifiable risk factors for suicide: under treated depression . Patient has following non-modifiable or demographic risk factors for suicide: history of self harm behavior Patient has the following protective factors against suicide: Access to outpatient mental health care, Supportive friends, and Frustration tolerance  Thank you for this consult request. Recommendations have been communicated to the primary team.  We will recommend discharge and outpatient follow up at this time.   Lauree Chandler, NP     History of Present Illness   Patient Report:  Patient reports presenting to the emergency department after experiencing several episodes of vomiting while at [facility].  She was being seen at [facility] today to start psychiatric services. She had already been established at [facility] for therapy. States that she was started on an antidepressant today and is supposed to pick up the prescription at the pharmacy. She does not recall the name of the antidepressant or the dosage. She has been attending therapy at [facility] for the past several months.  She states she sees her therapist once every 2 weeks.  Patient reports history of depression.  She believes her depression has been worsening.  She denies current passive or active suicidal ideations, plan or intent.  She adamantly denies she is a danger to herself and notes that she is actively seeking help.  She reports she does experience tearful episodes at times. Patient denies homicidal ideations.  When asked about auditory visual hallucinations, she states she feels that she  experiences "vibrations" from a steel box her friend, Brianna Harris, has in [place] roommate's room. She states she has been experiencing these "vibrations" for the past year.   She states she last experienced these "vibrations " yesterday, although typically experiences them daily.  She last saw Kayla last year.  When reality testing, she does understand that this is likely not true.   Patient denies past suicide attempts.  She states in December 2024, after an argument with her mother and a friend, she took her stepfather's firearm and held it to her head.  She states 2 years ago, she had opened the cabinet in the kitchen and found knives, had fleeting thoughts of stabbing herself.  She denies she currently has access to a firearm or other weapon. When asked about episodes of nonsuicidal self injurious behavior, reports she had 1 episode when she punched steel in the bathroom, which occurred several months ago.  She denies past history of inpatient psychiatric hospitalizations.  She denies knowledge of family psychiatric history or family history of suicide.  Patient currently resides with her friend [name].  She states [name] is very supportive.  Psych ROS:  Endorses worsening depression Denies current passive or active suicidal ideation, plan or intent Denies homicidal ideations Possible fixed delusions Possible tactile hallucinations  Collateral information:  Collateral obtained from patient's friend who remains with her throughout the assessment. Patient's friend's  name is [name].  [name] states while she does believe patient is depressed, she does not feel patient is a danger to herself or anybody else.  She is advocating against an inpatient psychiatric hospitalization.  She states she believes that patient's depression stems from not having a lot of friends.  When asked about patient's friend Brianna Harris, she confirms that Brianna Harris is a real person.  Patient has discussed with her experiencing these  "vibrations ".  She agrees to safety planning today.  Safety planning completed with [name].  She agrees to securing significant sharps in the home.  She states pt does not have access to a firearm or other weapon to her knowledge. She states that she will reach out to patient's mother to secure the firearms in her home.  Patient is in agreement that [name] will reach out to her mother.  [name] will also secure medications in the home and dispense medications to the patient.  For any additional concerns, she will bring patient back to the emergency department.  Attempted to speak with [facility] psychiatrist Wray Kearns at [phone].  Made several attempts without success.  Patient declined for her mother to be called for collateral.  She reports conflicted relationship with mother.  She did not want her mother to be aware that she was in the hospital or her mental health concerns.   Review of Systems  Respiratory:  Negative for shortness of breath.   Cardiovascular:  Negative for chest pain.  Psychiatric/Behavioral:  Positive for depression. Negative for suicidal ideas.     Psychiatric and Social History  Psychiatric History:   Prev Dx/Sx: Crack/cocaine use, tobacco abuse, alcohol abuse, depression Current Psych Provider: Patient states that she started psychiatric services at [facility] today.  She does not recall the name of her psychiatric provider. Home Meds (current): Patient states she was started on an antidepressant today.  She does not recall the name of the antidepressant or the dosage. Therapy: Patient reports she is currently connected with therapy at [facility].  She has been attending for the past several months.  She sees her therapist once every 2 weeks.  Prior Psych Hospitalization: Patient denies prior psychiatric hospitalizations Prior Self Harm: Patient reports 1 episode when she punched steel in the bathroom, which occurred several months ago.  Family Psych History: Patient denies  knowledge of family psychiatric history Family Hx suicide: Patient denies knowledge of family history of suicide  Social History:  Occupational Hx: Patient reports she receives disability. Legal Hx: Patient denies pending charges or court dates. Living Situation: Patient is living with her friend, [name].  Access to weapons/lethal means: Patient denies access to a firearm or other weapons.  Exam Findings   Vital Signs:  Temp:  [97.8 F (36.6 C)] 97.8 F (36.6 C) (02/12 1231) Pulse Rate:  [74] 74 (02/12 1231) Resp:  [16] 16 (02/12 1231) BP: (165)/(92) 165/92 (02/12 1231) SpO2:  [94 %] 94 % (02/12 1231) Weight:  [59 kg] 59 kg (02/12 1230) Blood pressure (!) 165/92, pulse 74, temperature 97.8 F (36.6 C), temperature source Oral, resp. rate 16, height 5\' 2"  (1.575 m), weight 59 kg, SpO2 94%. Body mass index is 23.79 kg/m.  Physical Exam Constitutional:      General: She is not in acute distress.    Appearance: She is not ill-appearing, toxic-appearing or diaphoretic.  Pulmonary:     Effort: Pulmonary effort is normal. No respiratory distress.  Neurological:     Mental Status: She is alert and oriented to  person, place, and time.  Psychiatric:        Attention and Perception: Attention and perception normal.        Mood and Affect: Mood is depressed. Affect is blunt.        Behavior: Behavior is cooperative.        Thought Content: Thought content is delusional. Thought content does not include homicidal or suicidal ideation.   Mental Status Exam: General Appearance:  Appropriate for environment  Orientation:  Full (Time, Place, and Person)  Memory:  Immediate;   Fair  Concentration:  Concentration: Fair  Recall:  Fair  Attention  Fair  Eye Contact:  Fair  Speech:   utilizes ASL  Language:   utilizes ASL  Volume:   utilizes ASL  Mood: depressed  Affect:  Blunt  Thought Process:  Coherent and Goal Directed  Thought Content:  Possible fixed delusions/tactile  hallucinations  Suicidal Thoughts:  No  Homicidal Thoughts:  No  Judgement:  Intact  Insight:  Present  Psychomotor Activity:  Normal  Akathisia:  No  Fund of Knowledge:  Fair   Assets:  Manufacturing systems engineer Desire for Improvement Financial Resources/Insurance Housing Resilience Social Support  Cognition:  WNL  ADL's:  Intact  AIMS (if indicated):      Other History   These have been pulled in through the EMR, reviewed, and updated if appropriate.   Family History:  The patient's family history includes Diabetes in her maternal uncle; Heart attack in her father; Kidney cancer in her paternal uncle.  Medical History: Past Medical History:  Diagnosis Date   Asthma    Deafness    Depression    Diabetes mellitus without complication (HCC)    Family history of adverse reaction to anesthesia    Mother - PONV   GERD (gastroesophageal reflux disease)    Heart abnormality    per mother pt has bad heart murmor- sees Dr Lady Gary    Hyperlipidemia    Hypertension    Mechanical heart valve present    PONV (postoperative nausea and vomiting)    Presence of permanent cardiac pacemaker    Staring episodes    Surgical History: Past Surgical History:  Procedure Laterality Date   ABDOMINAL HYSTERECTOMY  2000   per mother [facility] thought pt cancer and did hyster ,cervix and ovaries   CARDIAC VALVE REPLACEMENT     CATARACT EXTRACTION W/PHACO Left 02/14/2021   Procedure: CATARACT EXTRACTION PHACO AND INTRAOCULAR LENS PLACEMENT (IOC) Left DIABETIC;  Surgeon: Galen Manila, MD;  Location: [facility];  Service: Ophthalmology;  Laterality: Left;  leave arrival at 9 sign language interpreter has been requested  kp 6.12 00:39.2   CHOLECYSTECTOMY     COLONOSCOPY WITH PROPOFOL N/A 08/17/2016   Procedure: COLONOSCOPY WITH PROPOFOL;  Surgeon: Scot Jun, MD;  Location: [facility] ENDOSCOPY;  Service: Endoscopy;  Laterality: N/A;   COLONOSCOPY WITH PROPOFOL N/A 02/02/2020   Procedure:  COLONOSCOPY WITH PROPOFOL;  Surgeon: Toney Reil, MD;  Location: [facility] ENDOSCOPY;  Service: Gastroenterology;  Laterality: N/A;   ESOPHAGOGASTRODUODENOSCOPY (EGD) WITH PROPOFOL N/A 08/17/2016   Procedure: ESOPHAGOGASTRODUODENOSCOPY (EGD) WITH PROPOFOL;  Surgeon: Scot Jun, MD;  Location: [facility] ENDOSCOPY;  Service: Endoscopy;  Laterality: N/A;   ESOPHAGOGASTRODUODENOSCOPY (EGD) WITH PROPOFOL  02/02/2020   Procedure: ESOPHAGOGASTRODUODENOSCOPY (EGD) WITH PROPOFOL;  Surgeon: Toney Reil, MD;  Location: [facility] ENDOSCOPY;  Service: Gastroenterology;;   PACEMAKER IMPLANT  01/2018   Medications:  No current facility-administered medications for this encounter.  Current Outpatient  Medications:    acetaminophen (TYLENOL) 500 MG tablet, Take 1,000 mg by mouth every 6 (six) hours as needed for moderate pain., Disp: , Rfl:    amitriptyline (ELAVIL) 25 MG tablet, Take 25 mg by mouth at bedtime., Disp: , Rfl:    atorvastatin (LIPITOR) 40 MG tablet, Take 40 mg by mouth at bedtime., Disp: , Rfl:    cyclobenzaprine (FLEXERIL) 5 MG tablet, Take 1 tablet (5 mg total) by mouth 3 (three) times daily as needed for muscle spasms., Disp: 30 tablet, Rfl: 0   dextromethorphan-guaiFENesin (MUCINEX DM) 30-600 MG 12hr tablet, Take 1 tablet by mouth 2 (two) times daily as needed for cough., Disp: 30 tablet, Rfl: 0   Dulaglutide 0.75 MG/0.5ML SOPN, Inject 0.75 mg into the skin once a week., Disp: , Rfl:    HYDROcodone bit-homatropine (HYCODAN) 5-1.5 MG/5ML syrup, Take 5 mLs by mouth every 8 (eight) hours as needed for cough., Disp: 120 mL, Rfl: 0   lamoTRIgine (LAMICTAL) 100 MG tablet, Take 100 mg by mouth 2 (two) times daily., Disp: , Rfl:    metFORMIN (GLUCOPHAGE-XR) 500 MG 24 hr tablet, Take 1,000 mg by mouth 2 (two) times daily., Disp: , Rfl:    mirabegron ER (MYRBETRIQ) 50 MG TB24 tablet, Take 1 tablet (50 mg total) by mouth daily., Disp: 30 tablet, Rfl: 0   omeprazole (PRILOSEC) 40 MG capsule, Take 40  mg by mouth daily., Disp: , Rfl:    potassium chloride SA (KLOR-CON M) 20 MEQ tablet, Take 1 tablet (20 mEq total) by mouth 2 (two) times daily for 5 days., Disp: 10 tablet, Rfl: 0   warfarin (COUMADIN) 1 MG tablet, Take 1 mg by mouth daily. (Take with 3mg  tablet to equal 4mg  total), Disp: , Rfl:    warfarin (COUMADIN) 3 MG tablet, Take 3 mg by mouth daily. (Take with 1mg  tablet to equal 4mg  total), Disp: , Rfl:   Allergies: No Known Allergies  Lauree Chandler, NP

## 2023-06-05 NOTE — ED Notes (Signed)
Patient has been medically cleared and psych is also clearing patient and plan on discharging.

## 2023-06-05 NOTE — ED Notes (Signed)
MD at the bedside for pt evaluation

## 2023-06-05 NOTE — ED Notes (Signed)
ASL Interpreter used to discuss symptoms for pt visit. Pt reports she has vomited 6X since this afternoon while at her behavioral health evaluation with [facility]. Denies abd pain. no tenderness noted.

## 2023-06-05 NOTE — ED Notes (Signed)
Pt ambulatory to restroom with steady gait. Sterile cup provided for urine sample. No distress noted at this time.

## 2023-06-05 NOTE — ED Triage Notes (Signed)
First Nurse Note:  Brianna Harris [phone] psychiatrist who was evaluating pt today states that pt has been having thoughts to harm herself. Report SI last week with a plan to stab herself. Pt held a gun to her head on Christmas and still has access to that gun. Pt is also being sent over for vomiting also because pt started to vomit during the assessment.

## 2023-06-05 NOTE — ED Provider Notes (Signed)
[facility] Provider Note    Event Date/Time   First MD Initiated Contact with Patient 06/05/23 1243     (approximate)   History   Chief Complaint Vomiting   HPI  Brianna Harris is a 63 y.o. female with past medical history of hypertension, hyperlipidemia, diabetes, alcohol abuse, and artificial heart valve on Coumadin who presents to the ED complaining of nausea and vomiting.  History is limited as patient is deaf, history obtained via sign language interpreter [phone].  Patient was reportedly at [facility] for psychiatric evaluation when she began to feel nauseous and had multiple episodes of vomiting.  She was subsequently referred to the ED for further evaluation.  She denies any associated abdominal pain or diarrhea, states that nausea is improved without intervention.  She was feeling well earlier in the day, denies any fevers, dysuria, hematuria, or flank pain.  She does state that she has been depressed with intermittent thoughts of suicide.  This included holding a gun to her head over the Christmas holiday, since then has continued to have thoughts of not wanting to be here anymore.  She states that she is not currently feeling suicidal, denies substance abuse.      Physical Exam   Triage Vital Signs: ED Triage Vitals  Encounter Vitals Group     BP 06/05/23 1231 (!) 165/92     Systolic BP Percentile --      Diastolic BP Percentile --      Pulse Rate 06/05/23 1231 74     Resp 06/05/23 1231 16     Temp 06/05/23 1231 97.8 F (36.6 C)     Temp Source 06/05/23 1231 Oral     SpO2 06/05/23 1231 94 %     Weight 06/05/23 1230 130 lb 1.1 oz (59 kg)     Height 06/05/23 1230 5\' 2"  (1.575 m)     Head Circumference --      Peak Flow --      Pain Score 06/05/23 1230 0     Pain Loc --      Pain Education --      Exclude from Growth Chart --     Most recent vital signs: Vitals:   06/05/23 1231  BP: (!) 165/92  Pulse: 74  Resp: 16  Temp: 97.8 F (36.6  C)  SpO2: 94%    Constitutional: Alert and oriented. Eyes: Conjunctivae are normal. Head: Atraumatic. Nose: No congestion/rhinnorhea. Mouth/Throat: Mucous membranes are moist.  Cardiovascular: Normal rate, regular rhythm. Grossly normal heart sounds.  2+ radial pulses bilaterally. Respiratory: Normal respiratory effort.  No retractions. Lungs CTAB. Gastrointestinal: Soft and nontender. No distention. Musculoskeletal: No lower extremity tenderness nor edema.  Neurologic:  Normal speech and language. No gross focal neurologic deficits are appreciated.    ED Results / Procedures / Treatments   Labs (all labs ordered are listed, but only abnormal results are displayed) Labs Reviewed  COMPREHENSIVE METABOLIC PANEL - Abnormal; Notable for the following components:      Result Value   Chloride 96 (*)    Glucose, Bld 109 (*)    Total Bilirubin 1.8 (*)    All other components within normal limits  CBC - Abnormal; Notable for the following components:   WBC 13.9 (*)    All other components within normal limits  URINALYSIS, ROUTINE W REFLEX MICROSCOPIC - Abnormal; Notable for the following components:   Color, Urine YELLOW (*)    APPearance HAZY (*)  Protein, ur 30 (*)    Leukocytes,Ua MODERATE (*)    All other components within normal limits  ACETAMINOPHEN LEVEL - Abnormal; Notable for the following components:   Acetaminophen (Tylenol), Serum <10 (*)    All other components within normal limits  SALICYLATE LEVEL - Abnormal; Notable for the following components:   Salicylate Lvl <7.0 (*)    All other components within normal limits  URINE DRUG SCREEN, QUALITATIVE ([facility] ONLY) - Abnormal; Notable for the following components:   Tricyclic, Ur Screen POSITIVE (*)    All other components within normal limits  PROTIME-INR - Abnormal; Notable for the following components:   Prothrombin Time 30.3 (*)    INR 2.9 (*)    All other components within normal limits  LIPASE, BLOOD     PROCEDURES:  Critical Care performed: No  Procedures   MEDICATIONS ORDERED IN ED: Medications  ondansetron (ZOFRAN-ODT) disintegrating tablet 4 mg (4 mg Oral Given 06/05/23 1420)     IMPRESSION / MDM / ASSESSMENT AND PLAN / ED COURSE  I reviewed the triage vital signs and the nursing notes.                              63 y.o. female with past medical history of hypertension, hyperlipidemia, diabetes, deafness, alcohol abuse, and artificial heart valve on Coumadin who presents to the ED for nausea and vomiting starting this afternoon as well as depression and suicidal ideation recently.  Patient's presentation is most consistent with acute presentation with potential threat to life or bodily function.  Differential diagnosis includes, but is not limited to, depression, suicidal ideation, homicidal ideation, psychosis, anxiety, gastroenteritis, dehydration, electrolyte abnormality, AKI, pancreatitis, hepatitis, cholecystitis, GERD.  Patient nontoxic-appearing and in no acute distress, vital signs are unremarkable.  She denies any abdominal pain and has a benign abdominal exam.  Labs are reassuring with mild leukocytosis but no significant anemia, electrolyte abnormality, or AKI.  LFTs and lipase are unremarkable, Tylenol and salicylate levels are undetectable.  Her INR level is therapeutic and she is tolerating oral intake without difficulty following a dose of Zofran.  She may be medically cleared for psychiatric disposition, he is currently calm and cooperative and denies active SI.  She is willing to stay for psychiatric evaluation, which is pending at this time.   The patient has been placed in psychiatric observation due to the need to provide a safe environment for the patient while obtaining psychiatric consultation and evaluation, as well as ongoing medical and medication management to treat the patient's condition.  The patient has not been placed under full IVC at this  time.      FINAL CLINICAL IMPRESSION(S) / ED DIAGNOSES   Final diagnoses:  Suicidal ideation  Nausea and vomiting, unspecified vomiting type     Rx / DC Orders   ED Discharge Orders     None        Note:  This document was prepared using Dragon voice recognition software and may include unintentional dictation errors.   Chesley Noon, MD 06/05/23 1536

## 2023-06-05 NOTE — ED Provider Notes (Signed)
-----------------------------------------   4:02 PM on 06/05/2023 -----------------------------------------  I took over care on this patient from Dr. Larinda Buttery.  The patient had been medically cleared already.  Her vomiting has resolved.  I consulted and discussed the case with NP Nedra Hai from psychiatry who has evaluated the patient and has cleared her from a psychiatric perspective.  The patient is stable for discharge home at this time and does not demonstrate any acute danger to self or others.  Return precautions have been provided.   Brianna Bucy, MD 06/05/23 [phone]

## 2023-06-07 DIAGNOSIS — F32A Depression, unspecified: Secondary | ICD-10-CM | POA: Diagnosis not present

## 2023-06-07 DIAGNOSIS — R112 Nausea with vomiting, unspecified: Secondary | ICD-10-CM | POA: Diagnosis not present

## 2023-06-07 DIAGNOSIS — E1165 Type 2 diabetes mellitus with hyperglycemia: Secondary | ICD-10-CM | POA: Diagnosis not present

## 2023-06-07 DIAGNOSIS — H9193 Unspecified hearing loss, bilateral: Secondary | ICD-10-CM | POA: Diagnosis not present

## 2023-06-11 DIAGNOSIS — Z7901 Long term (current) use of anticoagulants: Secondary | ICD-10-CM | POA: Diagnosis not present

## 2023-06-11 DIAGNOSIS — Z952 Presence of prosthetic heart valve: Secondary | ICD-10-CM | POA: Diagnosis not present

## 2023-06-17 DIAGNOSIS — Z952 Presence of prosthetic heart valve: Secondary | ICD-10-CM | POA: Diagnosis not present

## 2023-06-17 DIAGNOSIS — Z7901 Long term (current) use of anticoagulants: Secondary | ICD-10-CM | POA: Diagnosis not present

## 2023-06-28 DIAGNOSIS — Z952 Presence of prosthetic heart valve: Secondary | ICD-10-CM | POA: Diagnosis not present

## 2023-06-28 DIAGNOSIS — Z7901 Long term (current) use of anticoagulants: Secondary | ICD-10-CM | POA: Diagnosis not present

## 2023-07-05 DIAGNOSIS — I350 Nonrheumatic aortic (valve) stenosis: Secondary | ICD-10-CM | POA: Diagnosis not present

## 2023-07-05 DIAGNOSIS — Z950 Presence of cardiac pacemaker: Secondary | ICD-10-CM | POA: Diagnosis not present

## 2023-07-05 DIAGNOSIS — I10 Essential (primary) hypertension: Secondary | ICD-10-CM | POA: Diagnosis not present

## 2023-07-05 DIAGNOSIS — Z7901 Long term (current) use of anticoagulants: Secondary | ICD-10-CM | POA: Diagnosis not present

## 2023-07-05 DIAGNOSIS — E7801 Familial hypercholesterolemia: Secondary | ICD-10-CM | POA: Diagnosis not present

## 2023-07-08 DIAGNOSIS — Z7901 Long term (current) use of anticoagulants: Secondary | ICD-10-CM | POA: Diagnosis not present

## 2023-07-08 DIAGNOSIS — Z952 Presence of prosthetic heart valve: Secondary | ICD-10-CM | POA: Diagnosis not present

## 2023-07-18 DIAGNOSIS — Z7901 Long term (current) use of anticoagulants: Secondary | ICD-10-CM | POA: Diagnosis not present

## 2023-07-18 DIAGNOSIS — Z952 Presence of prosthetic heart valve: Secondary | ICD-10-CM | POA: Diagnosis not present

## 2023-07-24 DIAGNOSIS — Z7901 Long term (current) use of anticoagulants: Secondary | ICD-10-CM | POA: Diagnosis not present

## 2023-07-24 DIAGNOSIS — Z952 Presence of prosthetic heart valve: Secondary | ICD-10-CM | POA: Diagnosis not present

## 2023-07-31 DIAGNOSIS — Z87898 Personal history of other specified conditions: Secondary | ICD-10-CM | POA: Diagnosis not present

## 2023-07-31 DIAGNOSIS — R42 Dizziness and giddiness: Secondary | ICD-10-CM | POA: Diagnosis not present

## 2023-07-31 DIAGNOSIS — G4733 Obstructive sleep apnea (adult) (pediatric): Secondary | ICD-10-CM | POA: Diagnosis not present

## 2023-07-31 DIAGNOSIS — F329 Major depressive disorder, single episode, unspecified: Secondary | ICD-10-CM | POA: Diagnosis not present

## 2023-07-31 DIAGNOSIS — R2689 Other abnormalities of gait and mobility: Secondary | ICD-10-CM | POA: Diagnosis not present

## 2023-08-06 DIAGNOSIS — W010XXA Fall on same level from slipping, tripping and stumbling without subsequent striking against object, initial encounter: Secondary | ICD-10-CM | POA: Diagnosis not present

## 2023-08-06 DIAGNOSIS — Y92009 Unspecified place in unspecified non-institutional (private) residence as the place of occurrence of the external cause: Secondary | ICD-10-CM | POA: Diagnosis not present

## 2023-08-06 DIAGNOSIS — M533 Sacrococcygeal disorders, not elsewhere classified: Secondary | ICD-10-CM | POA: Diagnosis not present

## 2023-08-08 DIAGNOSIS — Z952 Presence of prosthetic heart valve: Secondary | ICD-10-CM | POA: Diagnosis not present

## 2023-08-09 ENCOUNTER — Emergency Department

## 2023-08-09 ENCOUNTER — Encounter: Payer: Self-pay | Admitting: Emergency Medicine

## 2023-08-09 ENCOUNTER — Other Ambulatory Visit: Payer: Self-pay

## 2023-08-09 ENCOUNTER — Emergency Department
Admission: EM | Admit: 2023-08-09 | Discharge: 2023-08-09 | Disposition: A | Discharge status: Home / Self Care | Attending: Emergency Medicine | Admitting: Emergency Medicine

## 2023-08-09 DIAGNOSIS — I6782 Cerebral ischemia: Secondary | ICD-10-CM | POA: Diagnosis not present

## 2023-08-09 DIAGNOSIS — Z7901 Long term (current) use of anticoagulants: Secondary | ICD-10-CM | POA: Diagnosis not present

## 2023-08-09 DIAGNOSIS — Y92002 Bathroom of unspecified non-institutional (private) residence single-family (private) house as the place of occurrence of the external cause: Secondary | ICD-10-CM | POA: Insufficient documentation

## 2023-08-09 DIAGNOSIS — M79672 Pain in left foot: Secondary | ICD-10-CM | POA: Diagnosis not present

## 2023-08-09 DIAGNOSIS — S2241XA Multiple fractures of ribs, right side, initial encounter for closed fracture: Secondary | ICD-10-CM | POA: Diagnosis not present

## 2023-08-09 DIAGNOSIS — R9082 White matter disease, unspecified: Secondary | ICD-10-CM | POA: Diagnosis not present

## 2023-08-09 DIAGNOSIS — W1830XA Fall on same level, unspecified, initial encounter: Secondary | ICD-10-CM | POA: Insufficient documentation

## 2023-08-09 DIAGNOSIS — R296 Repeated falls: Secondary | ICD-10-CM | POA: Insufficient documentation

## 2023-08-09 DIAGNOSIS — R109 Unspecified abdominal pain: Secondary | ICD-10-CM | POA: Diagnosis not present

## 2023-08-09 DIAGNOSIS — E119 Type 2 diabetes mellitus without complications: Secondary | ICD-10-CM | POA: Insufficient documentation

## 2023-08-09 DIAGNOSIS — J449 Chronic obstructive pulmonary disease, unspecified: Secondary | ICD-10-CM | POA: Diagnosis not present

## 2023-08-09 DIAGNOSIS — M7752 Other enthesopathy of left foot: Secondary | ICD-10-CM | POA: Diagnosis not present

## 2023-08-09 DIAGNOSIS — S92515A Nondisplaced fracture of proximal phalanx of left lesser toe(s), initial encounter for closed fracture: Secondary | ICD-10-CM

## 2023-08-09 DIAGNOSIS — R519 Headache, unspecified: Secondary | ICD-10-CM | POA: Diagnosis not present

## 2023-08-09 DIAGNOSIS — R1900 Intra-abdominal and pelvic swelling, mass and lump, unspecified site: Secondary | ICD-10-CM | POA: Diagnosis not present

## 2023-08-09 DIAGNOSIS — I10 Essential (primary) hypertension: Secondary | ICD-10-CM | POA: Diagnosis not present

## 2023-08-09 DIAGNOSIS — R0781 Pleurodynia: Secondary | ICD-10-CM | POA: Diagnosis present

## 2023-08-09 DIAGNOSIS — M79676 Pain in unspecified toe(s): Secondary | ICD-10-CM | POA: Diagnosis not present

## 2023-08-09 DIAGNOSIS — S299XXA Unspecified injury of thorax, initial encounter: Secondary | ICD-10-CM | POA: Diagnosis not present

## 2023-08-09 DIAGNOSIS — W19XXXA Unspecified fall, initial encounter: Secondary | ICD-10-CM | POA: Diagnosis not present

## 2023-08-09 DIAGNOSIS — G319 Degenerative disease of nervous system, unspecified: Secondary | ICD-10-CM | POA: Diagnosis not present

## 2023-08-09 DIAGNOSIS — M542 Cervicalgia: Secondary | ICD-10-CM | POA: Diagnosis not present

## 2023-08-09 DIAGNOSIS — C49A0 Gastrointestinal stromal tumor, unspecified site: Secondary | ICD-10-CM | POA: Diagnosis not present

## 2023-08-09 LAB — CBC WITH DIFFERENTIAL/PLATELET
Abs Immature Granulocytes: 0.03 10*3/uL (ref 0.00–0.07)
Basophils Absolute: 0.1 10*3/uL (ref 0.0–0.1)
Basophils Relative: 1 %
Eosinophils Absolute: 0.4 10*3/uL (ref 0.0–0.5)
Eosinophils Relative: 3 %
HCT: 42.2 % (ref 36.0–46.0)
Hemoglobin: 13.8 g/dL (ref 12.0–15.0)
Immature Granulocytes: 0 %
Lymphocytes Relative: 21 %
Lymphs Abs: 2.4 10*3/uL (ref 0.7–4.0)
MCH: 29.8 pg (ref 26.0–34.0)
MCHC: 32.7 g/dL (ref 30.0–36.0)
MCV: 91.1 fL (ref 80.0–100.0)
Monocytes Absolute: 0.6 10*3/uL (ref 0.1–1.0)
Monocytes Relative: 5 %
Neutro Abs: 7.8 10*3/uL — ABNORMAL HIGH (ref 1.7–7.7)
Neutrophils Relative %: 70 %
Platelets: 294 10*3/uL (ref 150–400)
RBC: 4.63 MIL/uL (ref 3.87–5.11)
RDW: 12.9 % (ref 11.5–15.5)
WBC: 11.2 10*3/uL — ABNORMAL HIGH (ref 4.0–10.5)
nRBC: 0 % (ref 0.0–0.2)

## 2023-08-09 LAB — COMPREHENSIVE METABOLIC PANEL WITH GFR
ALT: 17 U/L (ref 0–44)
AST: 22 U/L (ref 15–41)
Albumin: 4.2 g/dL (ref 3.5–5.0)
Alkaline Phosphatase: 63 U/L (ref 38–126)
Anion gap: 9 (ref 5–15)
BUN: 23 mg/dL — ABNORMAL HIGH (ref 6–20)
CO2: 27 mmol/L (ref 22–32)
Calcium: 9.5 mg/dL (ref 8.9–10.3)
Chloride: 99 mmol/L (ref 98–111)
Creatinine, Ser: 0.94 mg/dL (ref 0.44–1.00)
GFR, Estimated: >60 mL/min (ref >60)
Glucose, Bld: 119 mg/dL — ABNORMAL HIGH (ref 70–99)
Potassium: 3.4 mmol/L — ABNORMAL LOW (ref 3.5–5.1)
Sodium: 135 mmol/L (ref 135–145)
Total Bilirubin: 1.4 mg/dL — ABNORMAL HIGH (ref 0.0–1.2)
Total Protein: 7.4 g/dL (ref 6.5–8.1)

## 2023-08-09 LAB — PROTIME-INR
INR: 1.6 — ABNORMAL HIGH (ref 0.8–1.2)
Prothrombin Time: 19.2 s — ABNORMAL HIGH (ref 11.4–15.2)

## 2023-08-09 MED ORDER — MORPHINE SULFATE (PF) 4 MG/ML IV SOLN
4.0000 mg | Freq: Once | INTRAVENOUS | Status: AC
  Administered 2023-08-09: 4 mg via INTRAVENOUS
  Filled 2023-08-09: qty 1

## 2023-08-09 MED ORDER — IOHEXOL 300 MG/ML  SOLN
100.0000 mL | Freq: Once | INTRAMUSCULAR | Status: AC | PRN
  Administered 2023-08-09: 100 mL via INTRAVENOUS

## 2023-08-09 MED ORDER — HYDROCODONE-ACETAMINOPHEN 5-325 MG PO TABS: 1.0000 | ORAL_TABLET | Freq: Four times a day (QID) | ORAL | 0 refills | Status: AC | PRN

## 2023-08-09 MED ORDER — ACETAMINOPHEN 325 MG PO TABS
650.0000 mg | ORAL_TABLET | Freq: Once | ORAL | Status: AC
  Administered 2023-08-09: 650 mg via ORAL
  Filled 2023-08-09: qty 2

## 2023-08-09 MED ORDER — LIDOCAINE 5 % EX PTCH
1.0000 | MEDICATED_PATCH | Freq: Once | CUTANEOUS | Status: DC
  Administered 2023-08-09: 1 via TRANSDERMAL
  Filled 2023-08-09: qty 1

## 2023-08-09 NOTE — ED Triage Notes (Signed)
 Presents via EMS from home  States she has had several falls  Having pain to right side/rib area  no LOC

## 2023-08-09 NOTE — ED Notes (Signed)
 Pt transported to CT ?

## 2023-08-09 NOTE — Discharge Instructions (Addendum)
 You were seen in the emergency department today for evaluation after your fall.  Your testing showed 2 rib fractures as well as a fracture of your pinky toe of your left foot.  I have included information for follow-up with podiatry regarding your toe fracture if needed.   You can take 650 mg of Tylenol  every 6 hours. Do not take more than 4 grams of Tylenol  in a day. You can also try over the counter lidocaine  patches.  If you have breakthrough pain, I have sent a short course of narcotic pain medication to your pharmacy. This can make you drowsy. Do not drive or operate machinery when taking this.  I have placed a referral to our hematology/oncology team regarding the area of soft tissue swelling seen on your CT scan.  They will likely contact you in the next few days to arrange follow-up. Return to the ER for any new or worsening symptoms.

## 2023-08-09 NOTE — ED Notes (Addendum)
 RN to bedside to answer call bell. Pt reports feels more SOB. Pt currently lying flat with oxygen sats 88%. Pt HOB elevated and placed on 2L [place] with improvement to 99%. Pt placed back on RA with decreased to 94%. Pt also reports feeling more flush and diaphoretic. Oral temp 97.8. Pt placed back on 2L [place] per request and EDP made aware

## 2023-08-09 NOTE — ED Provider Notes (Signed)
 [facility] Provider Note    Event Date/Time   First MD Initiated Contact with Patient 08/09/23 [phone]     (approximate)   History   Fall (/)   HPI  Brianna Harris is a 63 year old female with history of deafness, T2DM, aortic valve replacement on warfarin, COPD presenting to the ER for multiple falls.Over the past few days, patient has had multiple falls in her home.  Last night she went to the bathroom, felt weak and fell into the wall.  She currently reports pain over her right ribs, left foot, and sacral region.  Denies LOC.  Reports similar episodes in the past.  History obtained using ASL interpreter.  Collateral history from patient's friend who presented to bedside.    Physical Exam   Triage Vital Signs: ED Triage Vitals  Encounter Vitals Group     BP 08/09/23 0729 (!) 156/75     Systolic BP Percentile --      Diastolic BP Percentile --      Pulse Rate 08/09/23 0729 86     Resp 08/09/23 0729 20     Temp 08/09/23 0729 98 F (36.7 C)     Temp Source 08/09/23 0729 Oral     SpO2 08/09/23 0729 100 %     Weight 08/09/23 0722 130 lb 1.1 oz (59 kg)     Height 08/09/23 0722 5\' 2"  (1.575 m)     Head Circumference --      Peak Flow --      Pain Score 08/09/23 0724 4     Pain Loc --      Pain Education --      Exclude from Growth Chart --     Most recent vital signs: Vitals:   08/09/23 0745 08/09/23 0858  BP:    Pulse: 86   Resp:    Temp:  97.8 F (36.6 C)  SpO2: 98%     Nursing notes and vital signs reviewed.  General: Adult female, laying in bed, awake interactive Head: Atraumatic Neck: No midline tenderness Chest: Symmetric chest rise, palpation over the right anterior lateral chest wall Cardiac: Regular rhythm and rate.  Respiratory: Lungs clear to auscultation Abdomen: Soft, nondistended.  Tenderness to palpation of the right upper abdomen pelvis: Stable in AP and lateral compression. No tenderness to palpation. MSK: No  deformity to bilateral upper and lower extremity.  Limited range of motion of the lower extremities due to reported pain in her sacral region.  Tender to palpation over the left foot, lower extremities otherwise nontender. Neuro: Alert, oriented. GCS 15. Normal sensation to light touch in bilateral upper and lower extremity. Skin: No evidence of burns or lacerations..  ED Results / Procedures / Treatments   Labs (all labs ordered are listed, but only abnormal results are displayed) Labs Reviewed  CBC WITH DIFFERENTIAL/PLATELET - Abnormal; Notable for the following components:      Result Value   WBC 11.2 (*)    Neutro Abs 7.8 (*)    All other components within normal limits  COMPREHENSIVE METABOLIC PANEL WITH GFR - Abnormal; Notable for the following components:   Potassium 3.4 (*)    Glucose, Bld 119 (*)    BUN 23 (*)    Total Bilirubin 1.4 (*)    All other components within normal limits  PROTIME-INR - Abnormal; Notable for the following components:   Prothrombin Time 19.2 (*)    INR 1.6 (*)    All other components  within normal limits     EKG EKG independently reviewed interpreted by myself (ER attending) demonstrates:  EKG demonstrates atrially sensed paced rhythm at a rate of 87, PR 228, QRS 116, QTc 543, no appreciable ischemic changes  RADIOLOGY Imaging independently reviewed and interpreted by myself demonstrates:  CT head without acute bleed CT C-spine without actue  CT chest abdomen pelvis Foot x-Catalaya Garr with 5th phalanx fracture   Formal Radiology Read:  CT Head Wo Contrast Result Date: 08/09/2023 CLINICAL DATA:  Loss of balance.  Fall.  Pain. EXAM: CT HEAD WITHOUT CONTRAST CT CERVICAL SPINE WITHOUT CONTRAST TECHNIQUE: Multidetector CT imaging of the head and cervical spine was performed following the standard protocol without intravenous contrast. Multiplanar CT image reconstructions of the cervical spine were also generated. RADIATION DOSE REDUCTION: This exam was  performed according to the departmental dose-optimization program which includes automated exposure control, adjustment of the mA and/or kV according to patient size and/or use of iterative reconstruction technique. COMPARISON:  Head CT 05/14/2023. FINDINGS: CT HEAD FINDINGS Brain: There is no evidence for acute hemorrhage, hydrocephalus, mass lesion, or abnormal extra-axial fluid collection. No definite CT evidence for acute infarction. Left frontal infarct again noted. Diffuse loss of parenchymal volume is consistent with atrophy. Patchy low attenuation in the deep hemispheric and periventricular white matter is nonspecific, but likely reflects chronic microvascular ischemic demyelination. Vascular: No hyperdense vessel or unexpected calcification. Skull: No evidence for fracture. No worrisome lytic or sclerotic lesion. Sinuses/Orbits: The visualized paranasal sinuses and mastoid air cells are clear. Visualized portions of the globes and intraorbital fat are unremarkable. Other: None. CT CERVICAL SPINE FINDINGS Alignment: Straightening of normal cervical lordosis without evidence for traumatic subluxation. Skull base and vertebrae: No acute fracture. No primary bone lesion or focal pathologic process. Soft tissues and spinal canal: No prevertebral fluid or swelling. No visible canal hematoma. Disc levels: Loss of disc height noted C5-6 and C6-7 with endplate degeneration. Left-sided C5-6 facets are fused. Upper chest: Unremarkable. Other: None. IMPRESSION: 1. No acute intracranial abnormality. 2. Stable appearance chronic left frontal infarct. Atrophy with chronic small vessel white matter ischemic disease. 3. Degenerative changes in the cervical spine without acute fracture. Electronically Signed   By: Donnal Fusi M.D.   On: 08/09/2023 08:54   CT Cervical Spine Wo Contrast Result Date: 08/09/2023 CLINICAL DATA:  Loss of balance.  Fall.  Pain. EXAM: CT HEAD WITHOUT CONTRAST CT CERVICAL SPINE WITHOUT CONTRAST  TECHNIQUE: Multidetector CT imaging of the head and cervical spine was performed following the standard protocol without intravenous contrast. Multiplanar CT image reconstructions of the cervical spine were also generated. RADIATION DOSE REDUCTION: This exam was performed according to the departmental dose-optimization program which includes automated exposure control, adjustment of the mA and/or kV according to patient size and/or use of iterative reconstruction technique. COMPARISON:  Head CT 05/14/2023. FINDINGS: CT HEAD FINDINGS Brain: There is no evidence for acute hemorrhage, hydrocephalus, mass lesion, or abnormal extra-axial fluid collection. No definite CT evidence for acute infarction. Left frontal infarct again noted. Diffuse loss of parenchymal volume is consistent with atrophy. Patchy low attenuation in the deep hemispheric and periventricular white matter is nonspecific, but likely reflects chronic microvascular ischemic demyelination. Vascular: No hyperdense vessel or unexpected calcification. Skull: No evidence for fracture. No worrisome lytic or sclerotic lesion. Sinuses/Orbits: The visualized paranasal sinuses and mastoid air cells are clear. Visualized portions of the globes and intraorbital fat are unremarkable. Other: None. CT CERVICAL SPINE FINDINGS Alignment: Straightening of normal cervical lordosis  without evidence for traumatic subluxation. Skull base and vertebrae: No acute fracture. No primary bone lesion or focal pathologic process. Soft tissues and spinal canal: No prevertebral fluid or swelling. No visible canal hematoma. Disc levels: Loss of disc height noted C5-6 and C6-7 with endplate degeneration. Left-sided C5-6 facets are fused. Upper chest: Unremarkable. Other: None. IMPRESSION: 1. No acute intracranial abnormality. 2. Stable appearance chronic left frontal infarct. Atrophy with chronic small vessel white matter ischemic disease. 3. Degenerative changes in the cervical spine  without acute fracture. Electronically Signed   By: Donnal Fusi M.D.   On: 08/09/2023 08:54   CT CHEST ABDOMEN PELVIS W CONTRAST Result Date: 08/09/2023 CLINICAL DATA:  Recurrent falls with right-sided rib pain and sacral pain EXAM: CT CHEST, ABDOMEN, AND PELVIS WITH CONTRAST TECHNIQUE: Multidetector CT imaging of the chest, abdomen and pelvis was performed following the standard protocol during bolus administration of intravenous contrast. RADIATION DOSE REDUCTION: This exam was performed according to the departmental dose-optimization program which includes automated exposure control, adjustment of the mA and/or kV according to patient size and/or use of iterative reconstruction technique. CONTRAST:  OMNIPAQUE  IOHEXOL  300 MG/ML  SOLN COMPARISON:  CT abdomen and pelvis dated 02/19/2023 and multiple priors dating back to 03/20/2009, CT chest dated 12/28/2022 FINDINGS: Decreased sensitivity and specificity for detailed findings due to motion artifact. CT CHEST FINDINGS Cardiovascular: Right chest wall pacemaker leads terminate in the right atrium and ventricle. Status post aortic valve replacement. Multichamber cardiomegaly. No significant pericardial fluid/thickening. Great vessels are normal in course and caliber. No central pulmonary emboli. Coronary artery calcifications. Mediastinum/Nodes: Imaged thyroid  gland without nodules meeting criteria for imaging follow-up by size. Normal esophagus. No pathologically enlarged axillary, supraclavicular, mediastinal, or hilar lymph nodes. Lungs/Pleura: The central airways are patent. Mild bilateral lower lobe bronchial wall thickening with subsegmental mucous plugging. Mild diffuse mosaic attenuation. No focal consolidation. No pneumothorax. No pleural effusion. Musculoskeletal: Median sternotomy wires are nondisplaced. Minimally displaced right anterolateral fourth and nondisplaced right anterolateral fifth rib fractures. Multilevel degenerative changes of the  thoracic spine. CT ABDOMEN PELVIS FINDINGS Hepatobiliary: No focal hepatic lesions. No intra or extrahepatic biliary ductal dilation. Cholecystectomy. Pancreas: No focal lesions or main ductal dilation. Spleen: Normal in size without focal abnormality. Adrenals/Urinary Tract: No adrenal nodules. No suspicious renal mass, calculi, or hydronephrosis. No focal bladder wall thickening. Stomach/Bowel: 2.0 x 1.7 cm ovoid soft tissue density impressing upon the inferior gastric cardia with a small exophytic component (3:49, 6:48). In retrospect, this lesion was likely subtly present dating back to 03/20/2009. No evidence of bowel wall thickening, distention, or inflammatory changes. Colonic diverticulosis without acute diverticulitis. Moderate volume stool throughout the colon. Appendix is not discretely seen. Vascular/Lymphatic: Aortic atherosclerosis. No enlarged abdominal or pelvic lymph nodes. Reproductive: No adnexal masses. Other: No free fluid, fluid collection, or free air. Musculoskeletal: No acute or abnormal lytic or blastic osseous findings. Multilevel degenerative changes of the lumbar spine. IMPRESSION: 1. Minimally displaced right anterolateral fourth and nondisplaced right anterolateral fifth rib fractures. No pneumothorax. 2. No acute traumatic injury in the abdomen or pelvis. 3. A 2.0 x 1.7 cm ovoid soft tissue density impressing upon the inferior gastric cardia with a small exophytic component. In retrospect, this lesion was likely subtly present dating back to 03/20/2009. Findings are favored to represent a gastrointestinal stromal tumor. 4. Mild bilateral lower lobe bronchial wall thickening with subsegmental mucous plugging and mild diffuse mosaic attenuation, which may represent aspiration and small airways disease. 5. Colonic diverticulosis without acute  diverticulitis. 6. Moderate volume stool throughout the colon. Recommend correlation with constipation. 7. Aortic Atherosclerosis (ICD10-I70.0).  Coronary artery calcifications. Assessment for potential risk factor modification, dietary therapy or pharmacologic therapy may be warranted, if clinically indicated. Electronically Signed   By: Limin  Xu M.D.   On: 08/09/2023 08:51   DG Foot Complete Left Result Date: 08/09/2023 CLINICAL DATA:  Left foot pain after fall. EXAM: LEFT FOOT - COMPLETE 3+ VIEW COMPARISON:  None Available. FINDINGS: Trabecular lucency with mild cortical irregularity at the proximal shaft of fifth proximal phalanx, concerning for a nondisplaced fracture. The remainder of the visualized bones are intact. No dislocation. Mild asymmetric joint space narrowing of the first MTP joint. Calcaneal enthesopathy at the insertion of the Achilles tendon and the origin of the central cord of the plantar fascia. Soft tissue swelling along the lateral forefoot. IMPRESSION: Findings concerning for a nondisplaced fracture of the proximal shaft of the fifth proximal phalanx. Electronically Signed   By: Mannie Seek M.D.   On: 08/09/2023 08:19    PROCEDURES:  Critical Care performed: No  Procedures   MEDICATIONS ORDERED IN ED: Medications  acetaminophen  (TYLENOL ) tablet 650 mg (has no administration in time range)  lidocaine  (LIDODERM ) 5 % 1 patch (has no administration in time range)  morphine  (PF) 4 MG/ML injection 4 mg (4 mg Intravenous Given 08/09/23 0744)  iohexol  (OMNIPAQUE ) 300 MG/ML solution 100 mL (100 mLs Intravenous Contrast Given 08/09/23 0820)     IMPRESSION / MDM / ASSESSMENT AND PLAN / ED COURSE  I reviewed the triage vital signs and the nursing notes.  Differential diagnosis includes, but is not limited to, intra cranial bleed, spine fracture, rib fracture, pneumothorax, intra-abdominal injury, toe fracture, dislocation, soft tissue injury  Patient's presentation is most consistent with acute presentation with potential threat to life or bodily function.  63 year old female presenting with multiple falls.   Stable vitals on exam.  Will obtain labs, CT trauma pack, foot x-Eino Whitner to further evaluate.  Morphine  ordered for pain control.  9:36 AM CT imaging notable for 2 right-sided rib fractures without pneumothorax, fracture of fifth toe, incidentally noted gastric mass.  Patient updated on results of workup including incidental finding.  Referral was placed to hematology oncology.  Given her recurrent falls with multiple injuries, I did discuss admission for pain control and therapy evaluation.  Patient reports that she feels safe at home and would like to be discharged home.  Discussed multimodal pain control.  She is on warfarin, so we will hold off on NSAIDS and plan for Tylenol , narcotics for breakthrough pain, lidocaine  patches. She was given an incentive spirometer.  Placed in a postop shoe and toe was buddy taped.  Patient has a walker to help her get around and will be staying with family.   Strict return precautions provided.  Patient discharged stable condition.  Note: Patient reports that she will be staying with her mother and can be contacted at the number listed for her mother, Valeria Gates, in her chart to facilitate follow-up.      FINAL CLINICAL IMPRESSION(S) / ED DIAGNOSES   Final diagnoses:  Closed fracture of multiple ribs of right side, initial encounter  Closed nondisplaced fracture of proximal phalanx of lesser toe of left foot, initial encounter  Mass of soft tissue of abdomen     Rx / DC Orders   ED Discharge Orders          Ordered    Ambulatory referral to Hematology / Oncology  Comments: Your emergency department provider has referred you to see a hematology/oncology specialist. These are physicians who specialize in blood disorders and cancers, or findings concerning for cancer. You will receive a phone call from the [facility] Office to set up your appointment within 2 business days: [employer] operate Mon - Fri, 8:00 a.m. to 5:00 p.m.; closed for federally  recognized holidays. Please be sure your phone is not set to block numbers during this time.   08/09/23 0930    HYDROcodone -acetaminophen  (NORCO/VICODIN) 5-325 MG tablet  Every 6 hours PRN        08/09/23 0936             Note:  This document was prepared using Dragon voice recognition software and may include unintentional dictation errors.   Claria Crofts, MD 08/09/23 1049

## 2023-08-09 NOTE — ED Triage Notes (Addendum)
 Pt to ED via POV from home. EMS reports 2 falls throughout the night due to loss of balance. Pt reports right sided rib pain, left foot pain and sacral pain. Stroke screen negative. Hx of pacemaker and DM. Pt denies LOC. Pt is on warfarin   ASL interpreter use during triage

## 2023-08-09 NOTE — ED Notes (Signed)
Pt's friend at bedside

## 2023-08-12 ENCOUNTER — Telehealth: Payer: Self-pay

## 2023-08-12 NOTE — Telephone Encounter (Signed)
 Outbound call to patient again.  After touching base with Dr. Randy Buttery informed patient to come to lab visit as planned and explained more than likely there will no be no tests ordered for this week as some tests require collaboration with other specialties and have schedule, limited availability.  Caregiver verbalized understanding and is aware Dr. Randy Buttery will contact her after touching base with Kristi.  No further questions at this time.

## 2023-08-12 NOTE — Telephone Encounter (Signed)
 Per message received "Patient's mother called and would like to speak with Dr. Randy Buttery regarding her daughters recent admission to the hospital.  I will reach out to [facility] tomorrow regarding her ct findings of gastric mass and if EUS would be indicated instead of EGD. You can tell her mother we will reach out to her after we discuss her case with [facility] at [facility] regarding possible eus".  Outbound call to caregiver / mother; informed of above.  Mother inquired when the test would be; informed once Dr. Randy Buttery touches base with Lovette Rud tomorrow she will follow up with her accordingly.  Caregiver mentioned patient has labs scheduled this coming Thursday, lives 1 hr 15 min away and wanted to have everything done in one day if possible.  Informed Dr. Randy Buttery will be reaching out shortly; caregiver verbalized understanding.

## 2023-08-13 ENCOUNTER — Telehealth: Payer: Self-pay

## 2023-08-13 NOTE — Telephone Encounter (Signed)
 CT sent for review and approved for EUS by [facility]. Called and spoke with mother, Valeria Gates. Educated further on EUS and procedure can be scheduled for May 1 pending permission to hold coumadin . Clearance faxed to Dr. Dorean Gambles at [facility]. Trulicity  will also need to be held for 7 days prior. Will await clearance to hold coumadin  prior to scheduling. Valeria Gates will be notified once we have clearance for med hold. All questions answered.

## 2023-08-15 ENCOUNTER — Telehealth: Payer: Self-pay

## 2023-08-15 ENCOUNTER — Other Ambulatory Visit: Payer: Self-pay

## 2023-08-15 ENCOUNTER — Encounter: Payer: Self-pay | Admitting: Oncology

## 2023-08-15 DIAGNOSIS — Z952 Presence of prosthetic heart valve: Secondary | ICD-10-CM | POA: Diagnosis not present

## 2023-08-15 DIAGNOSIS — Z7901 Long term (current) use of anticoagulants: Secondary | ICD-10-CM | POA: Diagnosis not present

## 2023-08-15 DIAGNOSIS — Z5181 Encounter for therapeutic drug level monitoring: Secondary | ICD-10-CM | POA: Diagnosis not present

## 2023-08-15 NOTE — Telephone Encounter (Signed)
 Received call from anticoagulation clinic at [facility]. They have received permission from PA Moore at [facility] to arrange for Lovenox  Bridge for Coumadin  hold. Provided 5 day Coumadin  hold required for EUS. Procedure will be scheduled for Aug 22, 2023.

## 2023-08-16 ENCOUNTER — Telehealth: Payer: Self-pay

## 2023-08-16 NOTE — Telephone Encounter (Signed)
 Spoke to Gary this am, and she has received her instructions for Lovenox  bridge from the coumadin  clinic. She will hold Trulicity  starting today. We have reviewed instructions for EUS and copy mailed to [facility] home address. I have provided her with my direct home phone number for any further questions.

## 2023-08-21 ENCOUNTER — Encounter: Payer: Self-pay | Admitting: *Deleted

## 2023-08-22 ENCOUNTER — Ambulatory Visit: Admitting: Anesthesiology

## 2023-08-22 ENCOUNTER — Encounter: Payer: Self-pay | Admitting: *Deleted

## 2023-08-22 ENCOUNTER — Ambulatory Visit
Admission: RE | Admit: 2023-08-22 | Discharge: 2023-08-22 | Disposition: A | Discharge status: Home / Self Care | Attending: Gastroenterology | Admitting: Gastroenterology

## 2023-08-22 ENCOUNTER — Other Ambulatory Visit: Payer: Self-pay

## 2023-08-22 ENCOUNTER — Encounter
Admission: RE | Disposition: A | Discharge status: Home / Self Care | Payer: Self-pay | Source: Home / Self Care | Attending: Gastroenterology

## 2023-08-22 DIAGNOSIS — K219 Gastro-esophageal reflux disease without esophagitis: Secondary | ICD-10-CM | POA: Diagnosis not present

## 2023-08-22 DIAGNOSIS — I10 Essential (primary) hypertension: Secondary | ICD-10-CM | POA: Diagnosis not present

## 2023-08-22 DIAGNOSIS — K319 Disease of stomach and duodenum, unspecified: Secondary | ICD-10-CM | POA: Diagnosis not present

## 2023-08-22 DIAGNOSIS — D371 Neoplasm of uncertain behavior of stomach: Secondary | ICD-10-CM | POA: Diagnosis not present

## 2023-08-22 DIAGNOSIS — Z950 Presence of cardiac pacemaker: Secondary | ICD-10-CM | POA: Insufficient documentation

## 2023-08-22 DIAGNOSIS — Z7984 Long term (current) use of oral hypoglycemic drugs: Secondary | ICD-10-CM | POA: Insufficient documentation

## 2023-08-22 DIAGNOSIS — Z87891 Personal history of nicotine dependence: Secondary | ICD-10-CM | POA: Insufficient documentation

## 2023-08-22 DIAGNOSIS — N289 Disorder of kidney and ureter, unspecified: Secondary | ICD-10-CM | POA: Insufficient documentation

## 2023-08-22 DIAGNOSIS — E119 Type 2 diabetes mellitus without complications: Secondary | ICD-10-CM | POA: Insufficient documentation

## 2023-08-22 DIAGNOSIS — R933 Abnormal findings on diagnostic imaging of other parts of digestive tract: Secondary | ICD-10-CM | POA: Diagnosis present

## 2023-08-22 DIAGNOSIS — Z79899 Other long term (current) drug therapy: Secondary | ICD-10-CM | POA: Insufficient documentation

## 2023-08-22 DIAGNOSIS — K3182 Dieulafoy lesion (hemorrhagic) of stomach and duodenum: Secondary | ICD-10-CM | POA: Diagnosis not present

## 2023-08-22 DIAGNOSIS — J45909 Unspecified asthma, uncomplicated: Secondary | ICD-10-CM | POA: Insufficient documentation

## 2023-08-22 HISTORY — PX: EUS: SHX5427

## 2023-08-22 LAB — GLUCOSE, CAPILLARY: Glucose-Capillary: 84 mg/dL (ref 70–99)

## 2023-08-22 SURGERY — ULTRASOUND, UPPER GI TRACT, ENDOSCOPIC
Anesthesia: General

## 2023-08-22 MED ORDER — PROPOFOL 500 MG/50ML IV EMUL
INTRAVENOUS | Status: DC | PRN
  Administered 2023-08-22: 140 ug/kg/min via INTRAVENOUS

## 2023-08-22 MED ORDER — DEXMEDETOMIDINE HCL IN NACL 80 MCG/20ML IV SOLN
INTRAVENOUS | Status: DC | PRN
  Administered 2023-08-22: 2 ug via INTRAVENOUS
  Administered 2023-08-22: 4 ug via INTRAVENOUS

## 2023-08-22 MED ORDER — LIDOCAINE HCL (CARDIAC) PF 100 MG/5ML IV SOSY
PREFILLED_SYRINGE | INTRAVENOUS | Status: DC | PRN
  Administered 2023-08-22: 80 mg via INTRAVENOUS

## 2023-08-22 MED ORDER — PROPOFOL 10 MG/ML IV BOLUS
INTRAVENOUS | Status: DC | PRN
  Administered 2023-08-22 (×3): 20 mg via INTRAVENOUS
  Administered 2023-08-22: 100 mg via INTRAVENOUS

## 2023-08-22 MED ORDER — SODIUM CHLORIDE 0.9 % IV SOLN: INTRAVENOUS | Status: DC

## 2023-08-22 MED ORDER — GLYCOPYRROLATE 0.2 MG/ML IJ SOLN
INTRAMUSCULAR | Status: DC | PRN
  Administered 2023-08-22: .1 mg via INTRAVENOUS

## 2023-08-22 MED ORDER — PROPOFOL 10 MG/ML IV BOLUS
INTRAVENOUS | Status: AC
  Filled 2023-08-22: qty 20

## 2023-08-22 NOTE — Transfer of Care (Signed)
 Immediate Anesthesia Transfer of Care Note  Patient: Brianna Harris  Procedure(s) Performed: ULTRASOUND, UPPER GI TRACT, ENDOSCOPIC  Patient Location: Endoscopy Unit  Anesthesia Type:General  Level of Consciousness: drowsy  Airway & Oxygen Therapy: Patient Spontanous Breathing  Post-op Assessment: Report given to RN and Post -op Vital signs reviewed and stable  Post vital signs: Reviewed and stable  Last Vitals:  Vitals Value Taken Time  BP 138/51 08/22/23 1504  Temp    Pulse 69 08/22/23 1506  Resp 15 08/22/23 1506  SpO2 96 % 08/22/23 1506  Vitals shown include unfiled device data.  Last Pain:  Vitals:   08/22/23 1326  TempSrc: Temporal  PainSc: 0-No pain         Complications: No notable events documented.

## 2023-08-22 NOTE — Op Note (Signed)
 [facility] Gastroenterology Patient Name: Brianna Harris Procedure Date: 08/22/2023 2:04 PM MRN: [id number] Account #: [id number] Date of Birth: 03-13-1961 Admit Type: Outpatient Age: 63 Room: [facility] ENDO ROOM 3 Gender: Female Note Status: Finalized Instrument Name: Radial EUS [id number] Procedure:             Upper EUS Indications:           Suspected mass in stomach on CT scan Patient Profile:       Refer to note in patient chart for documentation of                         history and physical. Providers:             Markus Sill. Fritz Jewel, MD Referring MD:          Little Riff, MD (Referring MD) Medicines:             Monitored Anesthesia Care Complications:         No immediate complications. Procedure:             Pre-Anesthesia Assessment:                        - Monitored anesthesia care under the supervision of a                         CRNA was determined to be medically necessary for this                         procedure based on complex procedure (ERCP, EUS).                        After obtaining informed consent, the endoscope was                         passed under direct vision. Throughout the procedure,                         the patient's blood pressure, pulse, and oxygen                         saturations were monitored continuously. The Endoscope                         was introduced through the mouth, and advanced to the                         duodenum for ultrasound examination from the                         esophagus, stomach and duodenum. Findings:      ENDOSCOPIC FINDING: :      The examined esophagus was endoscopically normal. The GEJ was located at       40 cm from incisors.      A single 20 mm submucosal papule (nodule) with no bleeding and no       stigmata of recent bleeding was found in the cardia. The lesion was       located from 42cm to 44cm from the incisors.      The examined duodenum was endoscopically  normal.       ENDOSONOGRAPHIC FINDING: :      A lobulated intramural (subepithelial) lesion was found in the cardia of       the stomach. It was encountered at 2 cm distal to the gastroesophageal       junction. The lesion was hypoechoic. Sonographically, the lesion       appeared to originate from the muscularis propria (Layer 4). The lesion       measured 15 mm (in maximum thickness). The lesion also measured 20.4 x       16.9 mm in diameter. The outer endosonographic borders were well       defined. There was sonographic evidence suggesting invasion into the       muscularis propria (Layer 4). Fine needle biopsy was performed. Color       Doppler imaging was utilized prior to needle puncture to confirm a lack       of significant vascular structures within the needle path. Four passes       were made with the 22 gauge SharkCore biopsy needle using a transgastric       approach. A visible core of tissue was obtained. Preliminary cytologic       examination and touch preps were performed. The cellularity of the       specimen was adequate. Final cytology results are pending.      There was no sign of significant endosonographic abnormality in the left       lobe of the liver. No masses were identified.      No lymphadenopathy seen. Impression:            EGD Impression:                        - Normal esophagus.                        - A single submucosal papule (nodule) found in the                         stomach.                        - Normal examined duodenum.                        EUS Impression:                        - An intramural (subepithelial) lesion was found in                         the cardia of the stomach. The lesion appeared to                         originate from within the muscularis propria (Layer                         4). Tissue was obtained from this exam, and results                         are pending. However, the endosonographic appearance  is consistent with a GIST vs leiomyoma. Fine needle                         biopsy performed.                        - There was no evidence of significant pathology in                         the left lobe of the liver.                        - No lymphnodes visualized. Recommendation:        - Discharge patient to home.                        - Await cytology results.                        - If cytology confirms GIST refer to surgical oncology.                        - The findings and recommendations were discussed with                         the patient and their family. Attending Participation:      I personally performed the entire procedure. Dr. Markus Sill. Fritz Jewel, MD Markus Sill. Keyra Virella, MD 08/22/2023 3:29:46 PM This report has been signed electronically. Number of Addenda: 0 Note Initiated On: 08/22/2023 2:04 PM Estimated Blood Loss:  Estimated blood loss was minimal.      [facility]

## 2023-08-22 NOTE — Anesthesia Preprocedure Evaluation (Signed)
 Anesthesia Evaluation  Patient identified by MRN, date of birth, ID band Patient awake    Reviewed: Allergy & Precautions, NPO status , Patient's Chart, lab work & pertinent test results  History of Anesthesia Complications (+) PONV and history of anesthetic complications  Airway Mallampati: III  TM Distance: <3 FB Neck ROM: full    Dental  (+) Chipped   Pulmonary neg shortness of breath, asthma , former smoker   Pulmonary exam normal        Cardiovascular hypertension, Normal cardiovascular exam+ dysrhythmias + pacemaker      Neuro/Psych Seizures -,  PSYCHIATRIC DISORDERS         GI/Hepatic Neg liver ROS,GERD  Controlled,,  Endo/Other  negative endocrine ROSdiabetes    Renal/GU Renal disease  negative genitourinary   Musculoskeletal   Abdominal   Peds  Hematology negative hematology ROS (+)   Anesthesia Other Findings Patient is NPO appropriate and reports no nausea or vomiting today.  Past Medical History: No date: Asthma No date: Deafness No date: Depression No date: Diabetes mellitus without complication (HCC) No date: Family history of adverse reaction to anesthesia     Comment:  Mother - PONV No date: GERD (gastroesophageal reflux disease) No date: Heart abnormality     Comment:  per mother pt has bad heart murmor- sees Dr Cara Chancellor  No date: Hyperlipidemia No date: Hypertension No date: Mechanical heart valve present No date: PONV (postoperative nausea and vomiting) No date: Presence of permanent cardiac pacemaker No date: Staring episodes  Past Surgical History: 2000: ABDOMINAL HYSTERECTOMY     Comment:  per mother [facility] thought pt cancer and did hyster ,cervix               and ovaries No date: CARDIAC VALVE REPLACEMENT 02/14/2021: CATARACT EXTRACTION W/PHACO; Left     Comment:  Procedure: CATARACT EXTRACTION PHACO AND INTRAOCULAR               LENS PLACEMENT (IOC) Left DIABETIC;  Surgeon: Clair Crews, MD;  Location: [facility]ERY [facility];  Service:               Ophthalmology;  Laterality: Left;  leave arrival at               9 sign language interpreter has been requested                kp 6.12 00:39.2 No date: CHOLECYSTECTOMY 08/17/2016: COLONOSCOPY WITH PROPOFOL ; N/A     Comment:  Procedure: COLONOSCOPY WITH PROPOFOL ;  Surgeon: Cassie Click, MD;  Location: [facility] ENDOSCOPY;  Service:               Endoscopy;  Laterality: N/A; 02/02/2020: COLONOSCOPY WITH PROPOFOL ; N/A     Comment:  Procedure: COLONOSCOPY WITH PROPOFOL ;  Surgeon: Selena Daily, MD;  Location: [facility] ENDOSCOPY;  Service:               Gastroenterology;  Laterality: N/A; 08/17/2016: ESOPHAGOGASTRODUODENOSCOPY (EGD) WITH PROPOFOL ; N/A     Comment:  Procedure: ESOPHAGOGASTRODUODENOSCOPY (EGD) WITH               PROPOFOL ;  Surgeon: Cassie Click, MD;  Location: [facility]  ENDOSCOPY;  Service: Endoscopy;  Laterality: N/A; 02/02/2020: ESOPHAGOGASTRODUODENOSCOPY (EGD) WITH PROPOFOL      Comment:  Procedure: ESOPHAGOGASTRODUODENOSCOPY (EGD) WITH               PROPOFOL ;  Surgeon: Selena Daily, MD;  Location:               [facility] ENDOSCOPY;  Service: Gastroenterology;; 01/2018: PACEMAKER IMPLANT     Reproductive/Obstetrics negative OB ROS                             Anesthesia Physical Anesthesia Plan  ASA: 3  Anesthesia Plan: General   Post-op Pain Management:    Induction: Intravenous  PONV Risk Score and Plan: Propofol  infusion and TIVA  Airway Management Planned: Natural Airway and Nasal Cannula  Additional Equipment:   Intra-op Plan:   Post-operative Plan:   Informed Consent: I have reviewed the patients History and Physical, chart, labs and discussed the procedure including the risks, benefits and alternatives for the proposed anesthesia with the patient or authorized representative who has indicated  his/her understanding and acceptance.     Dental Advisory Given and Interpreter used for interview  Plan Discussed with: Anesthesiologist, CRNA and Surgeon  Anesthesia Plan Comments: (Patient and mother consented for risks of anesthesia including but not limited to:  - adverse reactions to medications - risk of airway placement if required - damage to eyes, teeth, lips or other oral mucosa - nerve damage due to positioning  - sore throat or hoarseness - Damage to heart, brain, nerves, lungs, other parts of body or loss of life  They voiced understanding and assent.)       Anesthesia Quick Evaluation

## 2023-08-22 NOTE — H&P (Signed)
 PRE-PROCEDURE HISTORY AND PHYSICAL   Brianna Harris presents for her scheduled Procedure(s): ULTRASOUND, UPPER GI TRACT, ENDOSCOPIC.  The indication for the procedure(s) is gastric cardia lesion.  There have been no significant recent changes in the patient's medical status.  Past Medical History:  Diagnosis Date   Asthma    Deafness    Depression    Diabetes mellitus without complication (HCC)    Family history of adverse reaction to anesthesia    Mother - PONV   GERD (gastroesophageal reflux disease)    Heart abnormality    per mother pt has bad heart murmor- sees Dr Brianna Harris    Hyperlipidemia    Hypertension    Mechanical heart valve present    PONV (postoperative nausea and vomiting)    Presence of permanent cardiac pacemaker    Staring episodes     Past Surgical History:  Procedure Laterality Date   ABDOMINAL HYSTERECTOMY  2000   per mother [facility] thought pt cancer and did hyster ,cervix and ovaries   CARDIAC VALVE REPLACEMENT     CATARACT EXTRACTION W/PHACO Left 02/14/2021   Procedure: CATARACT EXTRACTION PHACO AND INTRAOCULAR LENS PLACEMENT (IOC) Left DIABETIC;  Surgeon: Brianna Crews, MD;  Location: [facility] SURGERY CNTR;  Service: Ophthalmology;  Laterality: Left;  leave arrival at 9 sign language interpreter has been requested  kp 6.12 00:39.2   CHOLECYSTECTOMY     COLONOSCOPY WITH PROPOFOL  N/A 08/17/2016   Procedure: COLONOSCOPY WITH PROPOFOL ;  Surgeon: Brianna Click, MD;  Location: [facility] ENDOSCOPY;  Service: Endoscopy;  Laterality: N/A;   COLONOSCOPY WITH PROPOFOL  N/A 02/02/2020   Procedure: COLONOSCOPY WITH PROPOFOL ;  Surgeon: Brianna Daily, MD;  Location: [facility] ENDOSCOPY;  Service: Gastroenterology;  Laterality: N/A;   ESOPHAGOGASTRODUODENOSCOPY (EGD) WITH PROPOFOL  N/A 08/17/2016   Procedure: ESOPHAGOGASTRODUODENOSCOPY (EGD) WITH PROPOFOL ;  Surgeon: Brianna Click, MD;  Location: [facility] ENDOSCOPY;  Service: Endoscopy;  Laterality: N/A;    ESOPHAGOGASTRODUODENOSCOPY (EGD) WITH PROPOFOL   02/02/2020   Procedure: ESOPHAGOGASTRODUODENOSCOPY (EGD) WITH PROPOFOL ;  Surgeon: Brianna Daily, MD;  Location: [facility] ENDOSCOPY;  Service: Gastroenterology;;   PACEMAKER IMPLANT  01/2018    Allergies No Known Allergies  Medications Dulaglutide , HYDROcodone  bit-homatropine, acetaminophen , amitriptyline , atorvastatin , cyclobenzaprine , dextromethorphan -guaiFENesin , lamoTRIgine , metFORMIN , mirabegron  ER, omeprazole, potassium chloride  SA, and warfarin  Last dose of lovenox  the am of 4/30. Last dose of warfarin 4/26 Physical Examination  Body mass index is 22.86 kg/m. BP (!) 142/53   Pulse 66   Temp (!) 97.2 F (36.2 C) (Temporal)   Resp 16   Ht 5\' 2"  (1.575 m)   Wt 56.7 kg   SpO2 99%   BMI 22.86 kg/m  General:   Alert,  pleasant and cooperative in NAD Head:  Normocephalic and atraumatic. Lungs:  No Wheezes  Heart:  Regular rate and rhythm. Abdomen:  Soft, nontender and nondistended.  Neurologic:  Alert and  oriented x4;  grossly normal neurologically.    ASSESSMENT AND PLAN  Brianna Harris has been evaluated and deemed appropriate to undergo the planned Procedure(s): ULTRASOUND, UPPER GI TRACT, ENDOSCOPIC.

## 2023-08-23 ENCOUNTER — Encounter: Payer: Self-pay | Admitting: Gastroenterology

## 2023-08-26 DIAGNOSIS — Z952 Presence of prosthetic heart valve: Secondary | ICD-10-CM | POA: Diagnosis not present

## 2023-08-26 DIAGNOSIS — Z5181 Encounter for therapeutic drug level monitoring: Secondary | ICD-10-CM | POA: Diagnosis not present

## 2023-08-26 DIAGNOSIS — Z7901 Long term (current) use of anticoagulants: Secondary | ICD-10-CM | POA: Diagnosis not present

## 2023-08-26 LAB — CYTOLOGY - NON PAP

## 2023-08-27 ENCOUNTER — Telehealth: Payer: Self-pay

## 2023-08-27 NOTE — Telephone Encounter (Signed)
 Received voicemail from mother, Valeria Gates, requesting results of EUS biopsy.

## 2023-08-27 NOTE — Telephone Encounter (Signed)
 Please schedule an appointment with me in person sometime next week

## 2023-08-28 ENCOUNTER — Telehealth: Payer: Self-pay | Admitting: *Deleted

## 2023-08-28 NOTE — Telephone Encounter (Signed)
 I told the mother that Dr. Randy Buttery likes to talk to people when they have any kind of biopsies so she made an appointment for May 13 at 11 AM in for the patient as well as the mother.  The patient says that she should have gotten that to her rather than her daughter.  She wants someone to call her before the appointment of May 13 2 give her some information before she gets there on May 13.

## 2023-08-28 NOTE — Telephone Encounter (Signed)
 Spoke to [place], and briefly discussed cytology and surgical referral. She is asking for referral to [facility]. Will place referral. She will come next week to see Dr. Randy Buttery for formal results and discussion.

## 2023-08-31 NOTE — Anesthesia Postprocedure Evaluation (Signed)
 Anesthesia Post Note  Patient: Brianna Harris  Procedure(s) Performed: ULTRASOUND, UPPER GI TRACT, ENDOSCOPIC  Patient location during evaluation: Endoscopy Anesthesia Type: General Level of consciousness: awake and alert Pain management: pain level controlled Vital Signs Assessment: post-procedure vital signs reviewed and stable Respiratory status: spontaneous breathing, nonlabored ventilation, respiratory function stable and patient connected to nasal cannula oxygen Cardiovascular status: blood pressure returned to baseline and stable Postop Assessment: no apparent nausea or vomiting Anesthetic complications: no   No notable events documented.   Last Vitals:  Vitals:   08/22/23 1514 08/22/23 1526  BP: (!) 134/57 (!) 155/63  Pulse:  69  Resp:    Temp:    SpO2:      Last Pain:  Vitals:   08/22/23 1514  TempSrc:   PainSc: 0-No pain                 Vanice Genre

## 2023-09-03 ENCOUNTER — Inpatient Hospital Stay: Attending: Oncology | Admitting: Oncology

## 2023-09-03 VITALS — BP 100/74 | HR 66 | Temp 97.6°F | Resp 19 | Ht 62.0 in | Wt 125.0 lb

## 2023-09-03 DIAGNOSIS — D72828 Other elevated white blood cell count: Secondary | ICD-10-CM | POA: Diagnosis not present

## 2023-09-03 DIAGNOSIS — Z87891 Personal history of nicotine dependence: Secondary | ICD-10-CM | POA: Diagnosis not present

## 2023-09-03 DIAGNOSIS — C49A2 Gastrointestinal stromal tumor of stomach: Secondary | ICD-10-CM | POA: Insufficient documentation

## 2023-09-03 DIAGNOSIS — Z952 Presence of prosthetic heart valve: Secondary | ICD-10-CM | POA: Diagnosis not present

## 2023-09-03 DIAGNOSIS — C49A0 Gastrointestinal stromal tumor, unspecified site: Secondary | ICD-10-CM

## 2023-09-03 DIAGNOSIS — Z7901 Long term (current) use of anticoagulants: Secondary | ICD-10-CM | POA: Diagnosis not present

## 2023-09-03 DIAGNOSIS — E119 Type 2 diabetes mellitus without complications: Secondary | ICD-10-CM | POA: Diagnosis not present

## 2023-09-03 NOTE — Progress Notes (Signed)
 Interpreter for today is Theotis Flake [phone].

## 2023-09-07 ENCOUNTER — Encounter: Payer: Self-pay | Admitting: Oncology

## 2023-09-07 NOTE — Progress Notes (Signed)
 Hematology/Oncology Consult note [facility]  Telephone:([phone] [phone]  Patient Care Team: Little Riff, MD as PCP - General (Internal Medicine) Cara Chancellor Wiliam Harder, MD as Consulting Physician (Cardiology) Avonne Boettcher, MD as Consulting Physician (Oncology)   Name of the patient: Brianna Harris  [id number]  12/23/1960   Date of visit: 09/07/23  Diagnosis- 1. 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 H/o iron deficiency anemia 3. Gastric GIST  Chief complaint/ Reason for visit- discuss eus findings and further management  Heme/Onc history: Patient is a 63 year old female who have seen for leukocytosis since August 2019.  BCR ABL testing was negative for CML and peripheral flow cytometry showed no significant immunophenotypic abnormality.  Differential mainly shows neutrophilia and lymphocytosis and white count has been relatively stable between 11-15 over the last 4 years.  Patient has also been seeing me for her anemia and underwent EGD and colonoscopy with GI.  Colonoscopy showed 1 nonbleeding AVM which was treated with APC.Also had biopsies of duodenum and stomach.  Stomach biopsy showed intestinal metaplasia but otherwise negative for celiac dysplasia or malignancy. She also underwent heart valve surgery is on Coumadin .  She has received IV iron in the past     Interval history-history obtained with the help of sign language interpreter.Patient was admitted to the hospital in April 2025 for falls.  At that time she underwent CT chest abdomen and pelvis with contrast which incidentally showed 2 cm ovoid soft tissue mass in the gastric cardia.  ECOG PS- 1 Pain scale- 0   Review of systems- Review of Systems  Constitutional:  Negative for chills, fever, malaise/fatigue and weight loss.  HENT:  Negative for congestion, ear discharge and nosebleeds.   Eyes:  Negative for blurred vision.  Respiratory:  Negative for cough, hemoptysis,  sputum production, shortness of breath and wheezing.   Cardiovascular:  Negative for chest pain, palpitations, orthopnea and claudication.  Gastrointestinal:  Negative for abdominal pain, blood in stool, constipation, diarrhea, heartburn, melena, nausea and vomiting.  Genitourinary:  Negative for dysuria, flank pain, frequency, hematuria and urgency.  Musculoskeletal:  Negative for back pain, joint pain and myalgias.  Skin:  Negative for rash.  Neurological:  Negative for dizziness, tingling, focal weakness, seizures, weakness and headaches.  Endo/Heme/Allergies:  Does not bruise/bleed easily.  Psychiatric/Behavioral:  Negative for depression and suicidal ideas. The patient does not have insomnia.       No Known Allergies   Past Medical History:  Diagnosis Date   Asthma    Deafness    Depression    Diabetes mellitus without complication (HCC)    Family history of adverse reaction to anesthesia    Mother - PONV   GERD (gastroesophageal reflux disease)    Heart abnormality    per mother pt has bad heart murmor- sees Dr Cara Chancellor    Hyperlipidemia    Hypertension    Mechanical heart valve present    PONV (postoperative nausea and vomiting)    Presence of permanent cardiac pacemaker    Staring episodes      Past Surgical History:  Procedure Laterality Date   ABDOMINAL HYSTERECTOMY  2000   per mother [facility] thought pt cancer and did hyster ,cervix and ovaries   CARDIAC VALVE REPLACEMENT     CATARACT EXTRACTION W/PHACO Left 02/14/2021   Procedure: CATARACT EXTRACTION PHACO AND INTRAOCULAR LENS PLACEMENT (IOC) Left DIABETIC;  Surgeon: Clair Crews, MD;  Location: [facility];  Service: Ophthalmology;  Laterality: Left;  leave arrival at 9 sign language interpreter has been requested  kp 6.12 00:39.2   CHOLECYSTECTOMY     COLONOSCOPY WITH PROPOFOL  N/A 08/17/2016   Procedure: COLONOSCOPY WITH PROPOFOL ;  Surgeon: Cassie Click, MD;  Location: [facility] ENDOSCOPY;  Service:  Endoscopy;  Laterality: N/A;   COLONOSCOPY WITH PROPOFOL  N/A 02/02/2020   Procedure: COLONOSCOPY WITH PROPOFOL ;  Surgeon: Selena Daily, MD;  Location: [facility] ENDOSCOPY;  Service: Gastroenterology;  Laterality: N/A;   ESOPHAGOGASTRODUODENOSCOPY (EGD) WITH PROPOFOL  N/A 08/17/2016   Procedure: ESOPHAGOGASTRODUODENOSCOPY (EGD) WITH PROPOFOL ;  Surgeon: Cassie Click, MD;  Location: [facility] ENDOSCOPY;  Service: Endoscopy;  Laterality: N/A;   ESOPHAGOGASTRODUODENOSCOPY (EGD) WITH PROPOFOL   02/02/2020   Procedure: ESOPHAGOGASTRODUODENOSCOPY (EGD) WITH PROPOFOL ;  Surgeon: Selena Daily, MD;  Location: [facility] ENDOSCOPY;  Service: Gastroenterology;;   EUS N/A 08/22/2023   Procedure: ULTRASOUND, UPPER GI TRACT, ENDOSCOPIC;  Surgeon: Rayford Cake, MD;  Location: [facility] ENDOSCOPY;  Service: Gastroenterology;  Laterality: N/A;  MOTHER REQUESTING (IN PERSON) SIGN INTERPRETER - TRISH HAS REQUESTED (ASL) SIGN INTERPRETER FOR 1 PM FOR THURSDAY, MAY 1ST   PACEMAKER IMPLANT  01/2018    Social History   Socioeconomic History   Marital status: Single    Spouse name: Not on file   Number of children: Not on file   Years of education: Not on file   Highest education level: Not on file  Occupational History   Not on file  Tobacco Use   Smoking status: Former    Current packs/day: 0.00    Average packs/day: 1.5 packs/day for 39.0 years (58.5 ttl pk-yrs)    Types: Cigarettes    Start date: 02/14/1979    Quit date: 02/13/2018    Years since quitting: 5.5   Smokeless tobacco: Never  Vaping Use   Vaping status: Former  Substance and Sexual Activity   Alcohol  use: Not Currently    Comment: occasional wine   Drug use: Not Currently    Types: Marijuana    Comment: not for the past 5 years 2020   Sexual activity: Never  Other Topics Concern   Not on file  Social History Narrative   Not on file   Social Drivers of Health   Financial Resource Strain: Patient Unable To Answer (11/27/2022)   Received  from [facility]   Overall Financial Resource Strain (CARDIA)    Difficulty of Paying Living Expenses: Patient unable to answer  Food Insecurity: No Food Insecurity (11/27/2022)   Received from [facility]   Hunger Vital Sign    Worried About Running Out of Food in the Last Year: Never true    Ran Out of Food in the Last Year: Never true  Transportation Needs: No Transportation Needs (11/27/2022)   Received from [facility] - Transportation    In the past 12 months, has lack of transportation kept you from medical appointments or from getting medications?: No    Lack of Transportation (Non-Medical): No  Recent Concern: Transportation Needs - Unmet Transportation Needs (10/07/2022)   PRAPARE - Administrator, Civil Service (Medical): Yes    Lack of Transportation (Non-Medical): No  Physical Activity: Not on file  Stress: Not on file  Social Connections: Not on file  Intimate Partner Violence: Not At Risk (10/07/2022)   Humiliation, Afraid, Rape, and Kick questionnaire    Fear of Current or Ex-Partner: No    Emotionally Abused: No    Physically Abused: No  Sexually Abused: No    Family History  Problem Relation Age of Onset   Diabetes Maternal Uncle    Heart attack Father    Kidney cancer Paternal Uncle    Breast cancer Neg Hx    Bladder Cancer Neg Hx      Current Outpatient Medications:    ARIPiprazole (ABILIFY) 5 MG tablet, FOR THE FIRST WEEK, TAKE 1/2 TABLET BY MOUTH AT BEDTIME THEN INCREASE TO 1 TABLET AT BEDTIME, Disp: , Rfl:    enoxaparin  (LOVENOX ) 60 MG/0.6ML injection, SMARTSIG:0.6 Milliliter(s) SUB-Q Every 12 Hours, Disp: , Rfl:    metoprolol  tartrate (LOPRESSOR ) 25 MG tablet, SMARTSIG:1 Tablet(s) By Mouth Every 12 Hours, Disp: , Rfl:    nystatin cream (MYCOSTATIN), Apply topically 2 (two) times daily., Disp: , Rfl:    traZODone  (DESYREL ) 50 MG tablet, Take 1 tablet by mouth at bedtime., Disp: ,  Rfl:    acetaminophen  (TYLENOL ) 500 MG tablet, Take 1,000 mg by mouth every 6 (six) hours as needed for moderate pain., Disp: , Rfl:    amitriptyline  (ELAVIL ) 25 MG tablet, Take 25 mg by mouth at bedtime., Disp: , Rfl:    atorvastatin  (LIPITOR) 40 MG tablet, Take 40 mg by mouth at bedtime., Disp: , Rfl:    cyclobenzaprine  (FLEXERIL ) 5 MG tablet, Take 1 tablet (5 mg total) by mouth 3 (three) times daily as needed for muscle spasms., Disp: 30 tablet, Rfl: 0   dextromethorphan -guaiFENesin  (MUCINEX  DM) 30-600 MG 12hr tablet, Take 1 tablet by mouth 2 (two) times daily as needed for cough., Disp: 30 tablet, Rfl: 0   Dulaglutide  0.75 MG/0.5ML SOPN, Inject 0.75 mg into the skin once a week., Disp: , Rfl:    escitalopram (LEXAPRO) 10 MG tablet, Take 10 mg by mouth daily., Disp: , Rfl:    HYDROcodone  bit-homatropine (HYCODAN) 5-1.5 MG/5ML syrup, Take 5 mLs by mouth every 8 (eight) hours as needed for cough., Disp: 120 mL, Rfl: 0   lamoTRIgine  (LAMICTAL ) 100 MG tablet, Take 100 mg by mouth 2 (two) times daily., Disp: , Rfl:    metFORMIN  (GLUCOPHAGE -XR) 500 MG 24 hr tablet, Take 1,000 mg by mouth 2 (two) times daily., Disp: , Rfl:    mirabegron  ER (MYRBETRIQ ) 50 MG TB24 tablet, Take 1 tablet (50 mg total) by mouth daily., Disp: 30 tablet, Rfl: 0   omeprazole (PRILOSEC) 40 MG capsule, Take 40 mg by mouth daily., Disp: , Rfl:    potassium chloride  SA (KLOR-CON  M) 20 MEQ tablet, Take 1 tablet (20 mEq total) by mouth 2 (two) times daily for 5 days., Disp: 10 tablet, Rfl: 0   warfarin (COUMADIN ) 1 MG tablet, Take 1 mg by mouth daily. (Take with 3mg  tablet to equal 4mg  total), Disp: , Rfl:    warfarin (COUMADIN ) 3 MG tablet, Take 3 mg by mouth daily. (Take with 1mg  tablet to equal 4mg  total), Disp: , Rfl:   Physical exam:  Vitals:   09/03/23 1123  BP: 100/74  Pulse: 66  Resp: 19  Temp: 97.6 F (36.4 C)  TempSrc: Tympanic  SpO2: 97%  Weight: 125 lb (56.7 kg)  Height: 5\' 2"  (1.575 m)   Physical  Exam Cardiovascular:     Rate and Rhythm: Normal rate and regular rhythm.     Heart sounds: Normal heart sounds.  Pulmonary:     Effort: Pulmonary effort is normal.     Breath sounds: Normal breath sounds.  Skin:    General: Skin is warm and dry.  Neurological:  Mental Status: She is alert and oriented to person, place, and time.      I have personally reviewed labs listed below:    Latest Ref Rng & Units 08/09/2023    7:38 AM  CMP  Glucose 70 - 99 mg/dL 161   BUN 6 - 20 mg/dL 23   Creatinine 0.96 - 1.00 mg/dL 0.45   Sodium 409 - 811 mmol/L 135   Potassium 3.5 - 5.1 mmol/L 3.4   Chloride 98 - 111 mmol/L 99   CO2 22 - 32 mmol/L 27   Calcium  8.9 - 10.3 mg/dL 9.5   Total Protein 6.5 - 8.1 g/dL 7.4   Total Bilirubin 0.0 - 1.2 mg/dL 1.4   Alkaline Phos 38 - 126 U/L 63   AST 15 - 41 U/L 22   ALT 0 - 44 U/L 17       Latest Ref Rng & Units 08/09/2023    7:38 AM  CBC  WBC 4.0 - 10.5 K/uL 11.2   Hemoglobin 12.0 - 15.0 g/dL 91.4   Hematocrit 78.2 - 46.0 % 42.2   Platelets 150 - 400 K/uL 294    I have personally reviewed Radiology images listed below: No images are attached to the encounter.  CT Head Wo Contrast Result Date: 08/09/2023 CLINICAL DATA:  Loss of balance.  Fall.  Pain. EXAM: CT HEAD WITHOUT CONTRAST CT CERVICAL SPINE WITHOUT CONTRAST TECHNIQUE: Multidetector CT imaging of the head and cervical spine was performed following the standard protocol without intravenous contrast. Multiplanar CT image reconstructions of the cervical spine were also generated. RADIATION DOSE REDUCTION: This exam was performed according to the departmental dose-optimization program which includes automated exposure control, adjustment of the mA and/or kV according to patient size and/or use of iterative reconstruction technique. COMPARISON:  Head CT 05/14/2023. FINDINGS: CT HEAD FINDINGS Brain: There is no evidence for acute hemorrhage, hydrocephalus, mass lesion, or abnormal extra-axial fluid  collection. No definite CT evidence for acute infarction. Left frontal infarct again noted. Diffuse loss of parenchymal volume is consistent with atrophy. Patchy low attenuation in the deep hemispheric and periventricular white matter is nonspecific, but likely reflects chronic microvascular ischemic demyelination. Vascular: No hyperdense vessel or unexpected calcification. Skull: No evidence for fracture. No worrisome lytic or sclerotic lesion. Sinuses/Orbits: The visualized paranasal sinuses and mastoid air cells are clear. Visualized portions of the globes and intraorbital fat are unremarkable. Other: None. CT CERVICAL SPINE FINDINGS Alignment: Straightening of normal cervical lordosis without evidence for traumatic subluxation. Skull base and vertebrae: No acute fracture. No primary bone lesion or focal pathologic process. Soft tissues and spinal canal: No prevertebral fluid or swelling. No visible canal hematoma. Disc levels: Loss of disc height noted C5-6 and C6-7 with endplate degeneration. Left-sided C5-6 facets are fused. Upper chest: Unremarkable. Other: None. IMPRESSION: 1. No acute intracranial abnormality. 2. Stable appearance chronic left frontal infarct. Atrophy with chronic small vessel white matter ischemic disease. 3. Degenerative changes in the cervical spine without acute fracture. Electronically Signed   By: Donnal Fusi M.D.   On: 08/09/2023 08:54   CT Cervical Spine Wo Contrast Result Date: 08/09/2023 CLINICAL DATA:  Loss of balance.  Fall.  Pain. EXAM: CT HEAD WITHOUT CONTRAST CT CERVICAL SPINE WITHOUT CONTRAST TECHNIQUE: Multidetector CT imaging of the head and cervical spine was performed following the standard protocol without intravenous contrast. Multiplanar CT image reconstructions of the cervical spine were also generated. RADIATION DOSE REDUCTION: This exam was performed according to the departmental dose-optimization program  which includes automated exposure control, adjustment  of the mA and/or kV according to patient size and/or use of iterative reconstruction technique. COMPARISON:  Head CT 05/14/2023. FINDINGS: CT HEAD FINDINGS Brain: There is no evidence for acute hemorrhage, hydrocephalus, mass lesion, or abnormal extra-axial fluid collection. No definite CT evidence for acute infarction. Left frontal infarct again noted. Diffuse loss of parenchymal volume is consistent with atrophy. Patchy low attenuation in the deep hemispheric and periventricular white matter is nonspecific, but likely reflects chronic microvascular ischemic demyelination. Vascular: No hyperdense vessel or unexpected calcification. Skull: No evidence for fracture. No worrisome lytic or sclerotic lesion. Sinuses/Orbits: The visualized paranasal sinuses and mastoid air cells are clear. Visualized portions of the globes and intraorbital fat are unremarkable. Other: None. CT CERVICAL SPINE FINDINGS Alignment: Straightening of normal cervical lordosis without evidence for traumatic subluxation. Skull base and vertebrae: No acute fracture. No primary bone lesion or focal pathologic process. Soft tissues and spinal canal: No prevertebral fluid or swelling. No visible canal hematoma. Disc levels: Loss of disc height noted C5-6 and C6-7 with endplate degeneration. Left-sided C5-6 facets are fused. Upper chest: Unremarkable. Other: None. IMPRESSION: 1. No acute intracranial abnormality. 2. Stable appearance chronic left frontal infarct. Atrophy with chronic small vessel white matter ischemic disease. 3. Degenerative changes in the cervical spine without acute fracture. Electronically Signed   By: Donnal Fusi M.D.   On: 08/09/2023 08:54   CT CHEST ABDOMEN PELVIS W CONTRAST Result Date: 08/09/2023 CLINICAL DATA:  Recurrent falls with right-sided rib pain and sacral pain EXAM: CT CHEST, ABDOMEN, AND PELVIS WITH CONTRAST TECHNIQUE: Multidetector CT imaging of the chest, abdomen and pelvis was performed following the  standard protocol during bolus administration of intravenous contrast. RADIATION DOSE REDUCTION: This exam was performed according to the departmental dose-optimization program which includes automated exposure control, adjustment of the mA and/or kV according to patient size and/or use of iterative reconstruction technique. CONTRAST:  OMNIPAQUE  IOHEXOL  300 MG/ML  SOLN COMPARISON:  CT abdomen and pelvis dated 02/19/2023 and multiple priors dating back to 03/20/2009, CT chest dated 12/28/2022 FINDINGS: Decreased sensitivity and specificity for detailed findings due to motion artifact. CT CHEST FINDINGS Cardiovascular: Right chest wall pacemaker leads terminate in the right atrium and ventricle. Status post aortic valve replacement. Multichamber cardiomegaly. No significant pericardial fluid/thickening. Great vessels are normal in course and caliber. No central pulmonary emboli. Coronary artery calcifications. Mediastinum/Nodes: Imaged thyroid  gland without nodules meeting criteria for imaging follow-up by size. Normal esophagus. No pathologically enlarged axillary, supraclavicular, mediastinal, or hilar lymph nodes. Lungs/Pleura: The central airways are patent. Mild bilateral lower lobe bronchial wall thickening with subsegmental mucous plugging. Mild diffuse mosaic attenuation. No focal consolidation. No pneumothorax. No pleural effusion. Musculoskeletal: Median sternotomy wires are nondisplaced. Minimally displaced right anterolateral fourth and nondisplaced right anterolateral fifth rib fractures. Multilevel degenerative changes of the thoracic spine. CT ABDOMEN PELVIS FINDINGS Hepatobiliary: No focal hepatic lesions. No intra or extrahepatic biliary ductal dilation. Cholecystectomy. Pancreas: No focal lesions or main ductal dilation. Spleen: Normal in size without focal abnormality. Adrenals/Urinary Tract: No adrenal nodules. No suspicious renal mass, calculi, or hydronephrosis. No focal bladder wall  thickening. Stomach/Bowel: 2.0 x 1.7 cm ovoid soft tissue density impressing upon the inferior gastric cardia with a small exophytic component (3:49, 6:48). In retrospect, this lesion was likely subtly present dating back to 03/20/2009. No evidence of bowel wall thickening, distention, or inflammatory changes. Colonic diverticulosis without acute diverticulitis. Moderate volume stool throughout the colon. Appendix is not discretely  seen. Vascular/Lymphatic: Aortic atherosclerosis. No enlarged abdominal or pelvic lymph nodes. Reproductive: No adnexal masses. Other: No free fluid, fluid collection, or free air. Musculoskeletal: No acute or abnormal lytic or blastic osseous findings. Multilevel degenerative changes of the lumbar spine. IMPRESSION: 1. Minimally displaced right anterolateral fourth and nondisplaced right anterolateral fifth rib fractures. No pneumothorax. 2. No acute traumatic injury in the abdomen or pelvis. 3. A 2.0 x 1.7 cm ovoid soft tissue density impressing upon the inferior gastric cardia with a small exophytic component. In retrospect, this lesion was likely subtly present dating back to 03/20/2009. Findings are favored to represent a gastrointestinal stromal tumor. 4. Mild bilateral lower lobe bronchial wall thickening with subsegmental mucous plugging and mild diffuse mosaic attenuation, which may represent aspiration and small airways disease. 5. Colonic diverticulosis without acute diverticulitis. 6. Moderate volume stool throughout the colon. Recommend correlation with constipation. 7. Aortic Atherosclerosis (ICD10-I70.0). Coronary artery calcifications. Assessment for potential risk factor modification, dietary therapy or pharmacologic therapy may be warranted, if clinically indicated. Electronically Signed   By: Limin  Xu M.D.   On: 08/09/2023 08:51   DG Foot Complete Left Result Date: 08/09/2023 CLINICAL DATA:  Left foot pain after fall. EXAM: LEFT FOOT - COMPLETE 3+ VIEW COMPARISON:   None Available. FINDINGS: Trabecular lucency with mild cortical irregularity at the proximal shaft of fifth proximal phalanx, concerning for a nondisplaced fracture. The remainder of the visualized bones are intact. No dislocation. Mild asymmetric joint space narrowing of the first MTP joint. Calcaneal enthesopathy at the insertion of the Achilles tendon and the origin of the central cord of the plantar fascia. Soft tissue swelling along the lateral forefoot. IMPRESSION: Findings concerning for a nondisplaced fracture of the proximal shaft of the fifth proximal phalanx. Electronically Signed   By: Mannie Seek M.D.   On: 08/09/2023 08:19     Assessment and plan- Patient is a 63 y.o. female discussed EUS findings and further management  Patient underwent CT chest abdomen pelvis with contrast in April 2025 as a part of her fall workup.  She was incidentally noted to have a 2 cm ovoid gastric Strick mass in the cardia which was present slightly even dating back to November 2010.  Patient underwent EUS on 08/22/2023 by Dr. Fritz Jewel.  EUS showed 20 mm submucosal papule in the cardia which was hypoechoic.  Outer endosonographic borders were well-defined.  Sonographic evidence suggesting invasion into muscularis propria layer 4.  This was biopsied and was consistent with spindle cell fragments without significant atypia or increased mitotic activity.  Findings raise the possibility of GIST.  I have explained all these findings with the patient and her mother.  Given muscularis propria involvement this would not be amenable to endoscopic resection.  I am referring her to Dr. Stefan Edge from [facility] for definitive surgical resection.  Patient's insurance is not presently compatible with [facility].  However patient presently lives with her mother in [place]  which would be closer to [facility].  Her mother is her primary caregiver and needs to take her for these appointments and with therefore like to speak to her  insurance and keep her appointment at [facility].  Patient's mother is looking at moving her medical care to do given that her cardiovascular care is also at [facility].  I am not giving her any follow-up appointments with me at this point but she can contact us  if she needs to continue her care here at [facility].   Visit Diagnosis 1. Gastrointestinal stromal tumor (GIST) (  HCC)      Dr. Seretha Dance, MD, MPH [facility] at [facility] [phone] 09/07/2023 5:10 PM

## 2023-09-09 ENCOUNTER — Telehealth: Payer: Self-pay

## 2023-09-09 DIAGNOSIS — H9193 Unspecified hearing loss, bilateral: Secondary | ICD-10-CM | POA: Diagnosis not present

## 2023-09-09 DIAGNOSIS — C49A2 Gastrointestinal stromal tumor of stomach: Secondary | ICD-10-CM | POA: Diagnosis not present

## 2023-09-09 DIAGNOSIS — Z952 Presence of prosthetic heart valve: Secondary | ICD-10-CM | POA: Diagnosis not present

## 2023-09-09 DIAGNOSIS — Z7901 Long term (current) use of anticoagulants: Secondary | ICD-10-CM | POA: Diagnosis not present

## 2023-09-09 DIAGNOSIS — E119 Type 2 diabetes mellitus without complications: Secondary | ICD-10-CM | POA: Diagnosis not present

## 2023-09-09 NOTE — Telephone Encounter (Signed)
 Call placed to mother, Valeria Gates, regarding her upcoming appt with Dr. Rockwell Cinnamon. She stated she did not need anything further from our office.

## 2023-09-11 DIAGNOSIS — C49A0 Gastrointestinal stromal tumor, unspecified site: Secondary | ICD-10-CM | POA: Diagnosis not present

## 2023-09-18 ENCOUNTER — Other Ambulatory Visit: Payer: Self-pay | Admitting: Internal Medicine

## 2023-09-18 DIAGNOSIS — Z1231 Encounter for screening mammogram for malignant neoplasm of breast: Secondary | ICD-10-CM

## 2023-09-20 DIAGNOSIS — Z952 Presence of prosthetic heart valve: Secondary | ICD-10-CM | POA: Diagnosis not present

## 2023-09-20 DIAGNOSIS — Z7901 Long term (current) use of anticoagulants: Secondary | ICD-10-CM | POA: Diagnosis not present

## 2023-09-23 DIAGNOSIS — Z7901 Long term (current) use of anticoagulants: Secondary | ICD-10-CM | POA: Diagnosis not present

## 2023-09-23 DIAGNOSIS — Z952 Presence of prosthetic heart valve: Secondary | ICD-10-CM | POA: Diagnosis not present

## 2023-09-23 DIAGNOSIS — Z79899 Other long term (current) drug therapy: Secondary | ICD-10-CM | POA: Diagnosis not present

## 2023-09-23 DIAGNOSIS — R001 Bradycardia, unspecified: Secondary | ICD-10-CM | POA: Diagnosis not present

## 2023-09-23 DIAGNOSIS — E785 Hyperlipidemia, unspecified: Secondary | ICD-10-CM | POA: Diagnosis not present

## 2023-09-23 DIAGNOSIS — I10 Essential (primary) hypertension: Secondary | ICD-10-CM | POA: Diagnosis not present

## 2023-09-23 DIAGNOSIS — K3189 Other diseases of stomach and duodenum: Secondary | ICD-10-CM | POA: Diagnosis not present

## 2023-09-23 DIAGNOSIS — I350 Nonrheumatic aortic (valve) stenosis: Secondary | ICD-10-CM | POA: Diagnosis not present

## 2023-09-23 DIAGNOSIS — Z950 Presence of cardiac pacemaker: Secondary | ICD-10-CM | POA: Diagnosis not present

## 2023-09-23 DIAGNOSIS — Z45018 Encounter for adjustment and management of other part of cardiac pacemaker: Secondary | ICD-10-CM | POA: Diagnosis not present

## 2023-09-23 DIAGNOSIS — I442 Atrioventricular block, complete: Secondary | ICD-10-CM | POA: Diagnosis not present

## 2023-10-04 DIAGNOSIS — Z7901 Long term (current) use of anticoagulants: Secondary | ICD-10-CM | POA: Diagnosis not present

## 2023-10-04 DIAGNOSIS — Z952 Presence of prosthetic heart valve: Secondary | ICD-10-CM | POA: Diagnosis not present

## 2023-10-08 DIAGNOSIS — D500 Iron deficiency anemia secondary to blood loss (chronic): Secondary | ICD-10-CM | POA: Diagnosis not present

## 2023-10-08 DIAGNOSIS — I700 Atherosclerosis of aorta: Secondary | ICD-10-CM | POA: Diagnosis not present

## 2023-10-08 DIAGNOSIS — E7800 Pure hypercholesterolemia, unspecified: Secondary | ICD-10-CM | POA: Diagnosis not present

## 2023-10-08 DIAGNOSIS — K5520 Angiodysplasia of colon without hemorrhage: Secondary | ICD-10-CM | POA: Diagnosis not present

## 2023-10-08 DIAGNOSIS — I10 Essential (primary) hypertension: Secondary | ICD-10-CM | POA: Diagnosis not present

## 2023-10-08 DIAGNOSIS — R768 Other specified abnormal immunological findings in serum: Secondary | ICD-10-CM | POA: Diagnosis not present

## 2023-10-08 DIAGNOSIS — H9190 Unspecified hearing loss, unspecified ear: Secondary | ICD-10-CM | POA: Diagnosis not present

## 2023-10-08 DIAGNOSIS — N3281 Overactive bladder: Secondary | ICD-10-CM | POA: Diagnosis not present

## 2023-10-08 DIAGNOSIS — K760 Fatty (change of) liver, not elsewhere classified: Secondary | ICD-10-CM | POA: Diagnosis not present

## 2023-10-08 DIAGNOSIS — I350 Nonrheumatic aortic (valve) stenosis: Secondary | ICD-10-CM | POA: Diagnosis not present

## 2023-10-08 DIAGNOSIS — Z950 Presence of cardiac pacemaker: Secondary | ICD-10-CM | POA: Diagnosis not present

## 2023-10-08 DIAGNOSIS — Z8669 Personal history of other diseases of the nervous system and sense organs: Secondary | ICD-10-CM | POA: Diagnosis not present

## 2023-10-08 DIAGNOSIS — F32A Depression, unspecified: Secondary | ICD-10-CM | POA: Diagnosis not present

## 2023-10-08 DIAGNOSIS — K2100 Gastro-esophageal reflux disease with esophagitis, without bleeding: Secondary | ICD-10-CM | POA: Diagnosis not present

## 2023-10-08 DIAGNOSIS — Z7901 Long term (current) use of anticoagulants: Secondary | ICD-10-CM | POA: Diagnosis not present

## 2023-10-08 DIAGNOSIS — E1122 Type 2 diabetes mellitus with diabetic chronic kidney disease: Secondary | ICD-10-CM | POA: Diagnosis not present

## 2023-10-08 DIAGNOSIS — Z952 Presence of prosthetic heart valve: Secondary | ICD-10-CM | POA: Diagnosis not present

## 2023-10-21 ENCOUNTER — Encounter

## 2023-10-22 DIAGNOSIS — Z87891 Personal history of nicotine dependence: Secondary | ICD-10-CM | POA: Diagnosis not present

## 2023-10-22 DIAGNOSIS — E119 Type 2 diabetes mellitus without complications: Secondary | ICD-10-CM | POA: Diagnosis not present

## 2023-10-22 DIAGNOSIS — J449 Chronic obstructive pulmonary disease, unspecified: Secondary | ICD-10-CM | POA: Diagnosis not present

## 2023-10-22 DIAGNOSIS — K219 Gastro-esophageal reflux disease without esophagitis: Secondary | ICD-10-CM | POA: Diagnosis not present

## 2023-10-22 DIAGNOSIS — I10 Essential (primary) hypertension: Secondary | ICD-10-CM | POA: Diagnosis not present

## 2023-10-22 DIAGNOSIS — Z7984 Long term (current) use of oral hypoglycemic drugs: Secondary | ICD-10-CM | POA: Diagnosis not present

## 2023-10-22 DIAGNOSIS — E785 Hyperlipidemia, unspecified: Secondary | ICD-10-CM | POA: Diagnosis not present

## 2023-10-22 DIAGNOSIS — K3189 Other diseases of stomach and duodenum: Secondary | ICD-10-CM | POA: Diagnosis not present

## 2023-10-22 DIAGNOSIS — Z79899 Other long term (current) drug therapy: Secondary | ICD-10-CM | POA: Diagnosis not present

## 2023-10-22 DIAGNOSIS — D214 Benign neoplasm of connective and other soft tissue of abdomen: Secondary | ICD-10-CM | POA: Diagnosis not present

## 2023-10-22 DIAGNOSIS — K869 Disease of pancreas, unspecified: Secondary | ICD-10-CM | POA: Diagnosis not present

## 2023-10-22 DIAGNOSIS — Z950 Presence of cardiac pacemaker: Secondary | ICD-10-CM | POA: Diagnosis not present

## 2023-10-22 DIAGNOSIS — D490 Neoplasm of unspecified behavior of digestive system: Secondary | ICD-10-CM | POA: Diagnosis not present

## 2023-10-22 DIAGNOSIS — Z8673 Personal history of transient ischemic attack (TIA), and cerebral infarction without residual deficits: Secondary | ICD-10-CM | POA: Diagnosis not present

## 2023-10-24 DIAGNOSIS — I10 Essential (primary) hypertension: Secondary | ICD-10-CM | POA: Diagnosis not present

## 2023-10-24 DIAGNOSIS — Z7901 Long term (current) use of anticoagulants: Secondary | ICD-10-CM | POA: Diagnosis not present

## 2023-10-24 DIAGNOSIS — I350 Nonrheumatic aortic (valve) stenosis: Secondary | ICD-10-CM | POA: Diagnosis not present

## 2023-10-24 DIAGNOSIS — Z952 Presence of prosthetic heart valve: Secondary | ICD-10-CM | POA: Diagnosis not present

## 2023-10-24 DIAGNOSIS — Z950 Presence of cardiac pacemaker: Secondary | ICD-10-CM | POA: Diagnosis not present

## 2023-10-28 DIAGNOSIS — Z952 Presence of prosthetic heart valve: Secondary | ICD-10-CM | POA: Diagnosis not present

## 2023-10-28 DIAGNOSIS — M79641 Pain in right hand: Secondary | ICD-10-CM | POA: Diagnosis not present

## 2023-10-28 DIAGNOSIS — M79672 Pain in left foot: Secondary | ICD-10-CM | POA: Diagnosis not present

## 2023-10-30 DIAGNOSIS — K3189 Other diseases of stomach and duodenum: Secondary | ICD-10-CM | POA: Diagnosis not present

## 2023-11-01 DIAGNOSIS — Z7901 Long term (current) use of anticoagulants: Secondary | ICD-10-CM | POA: Diagnosis not present

## 2023-11-01 DIAGNOSIS — Z952 Presence of prosthetic heart valve: Secondary | ICD-10-CM | POA: Diagnosis not present

## 2023-11-04 ENCOUNTER — Ambulatory Visit
Admission: RE | Admit: 2023-11-04 | Discharge: 2023-11-04 | Disposition: A | Discharge status: Home / Self Care | Source: Ambulatory Visit | Attending: Internal Medicine | Admitting: Internal Medicine

## 2023-11-04 DIAGNOSIS — Z1231 Encounter for screening mammogram for malignant neoplasm of breast: Secondary | ICD-10-CM | POA: Diagnosis not present

## 2023-11-08 ENCOUNTER — Ambulatory Visit: Payer: PPO | Admitting: Oncology

## 2023-11-08 ENCOUNTER — Other Ambulatory Visit: Payer: PPO

## 2023-11-11 DIAGNOSIS — E1165 Type 2 diabetes mellitus with hyperglycemia: Secondary | ICD-10-CM | POA: Diagnosis not present

## 2023-11-11 DIAGNOSIS — S60221A Contusion of right hand, initial encounter: Secondary | ICD-10-CM | POA: Diagnosis not present

## 2023-11-14 DIAGNOSIS — Z7901 Long term (current) use of anticoagulants: Secondary | ICD-10-CM | POA: Diagnosis not present

## 2023-11-14 DIAGNOSIS — Z952 Presence of prosthetic heart valve: Secondary | ICD-10-CM | POA: Diagnosis not present

## 2023-11-21 DIAGNOSIS — Z7901 Long term (current) use of anticoagulants: Secondary | ICD-10-CM | POA: Diagnosis not present

## 2023-11-21 DIAGNOSIS — Z952 Presence of prosthetic heart valve: Secondary | ICD-10-CM | POA: Diagnosis not present

## 2023-11-28 DIAGNOSIS — Z952 Presence of prosthetic heart valve: Secondary | ICD-10-CM | POA: Diagnosis not present

## 2023-11-28 DIAGNOSIS — Z7901 Long term (current) use of anticoagulants: Secondary | ICD-10-CM | POA: Diagnosis not present

## 2023-12-03 ENCOUNTER — Other Ambulatory Visit: Payer: Self-pay | Admitting: *Deleted

## 2023-12-03 DIAGNOSIS — D509 Iron deficiency anemia, unspecified: Secondary | ICD-10-CM

## 2023-12-04 ENCOUNTER — Encounter: Payer: Self-pay | Admitting: Oncology

## 2023-12-04 ENCOUNTER — Inpatient Hospital Stay: Attending: Oncology

## 2023-12-04 ENCOUNTER — Inpatient Hospital Stay (HOSPITAL_BASED_OUTPATIENT_CLINIC_OR_DEPARTMENT_OTHER): Admitting: Oncology

## 2023-12-04 ENCOUNTER — Ambulatory Visit: Payer: Self-pay | Admitting: Oncology

## 2023-12-04 VITALS — BP 147/75 | HR 73 | Temp 96.6°F | Resp 18 | Ht 62.0 in | Wt 126.3 lb

## 2023-12-04 DIAGNOSIS — Z7984 Long term (current) use of oral hypoglycemic drugs: Secondary | ICD-10-CM | POA: Diagnosis not present

## 2023-12-04 DIAGNOSIS — J45909 Unspecified asthma, uncomplicated: Secondary | ICD-10-CM | POA: Diagnosis not present

## 2023-12-04 DIAGNOSIS — I10 Essential (primary) hypertension: Secondary | ICD-10-CM | POA: Diagnosis not present

## 2023-12-04 DIAGNOSIS — Z79899 Other long term (current) drug therapy: Secondary | ICD-10-CM | POA: Diagnosis not present

## 2023-12-04 DIAGNOSIS — Z950 Presence of cardiac pacemaker: Secondary | ICD-10-CM | POA: Diagnosis not present

## 2023-12-04 DIAGNOSIS — Z7985 Long-term (current) use of injectable non-insulin antidiabetic drugs: Secondary | ICD-10-CM | POA: Diagnosis not present

## 2023-12-04 DIAGNOSIS — Z87891 Personal history of nicotine dependence: Secondary | ICD-10-CM | POA: Diagnosis not present

## 2023-12-04 DIAGNOSIS — Z952 Presence of prosthetic heart valve: Secondary | ICD-10-CM | POA: Insufficient documentation

## 2023-12-04 DIAGNOSIS — E119 Type 2 diabetes mellitus without complications: Secondary | ICD-10-CM | POA: Diagnosis not present

## 2023-12-04 DIAGNOSIS — D509 Iron deficiency anemia, unspecified: Secondary | ICD-10-CM

## 2023-12-04 DIAGNOSIS — F32A Depression, unspecified: Secondary | ICD-10-CM | POA: Diagnosis not present

## 2023-12-04 DIAGNOSIS — C49A0 Gastrointestinal stromal tumor, unspecified site: Secondary | ICD-10-CM

## 2023-12-04 DIAGNOSIS — E785 Hyperlipidemia, unspecified: Secondary | ICD-10-CM | POA: Diagnosis not present

## 2023-12-04 DIAGNOSIS — Z7901 Long term (current) use of anticoagulants: Secondary | ICD-10-CM | POA: Insufficient documentation

## 2023-12-04 DIAGNOSIS — C49A2 Gastrointestinal stromal tumor of stomach: Secondary | ICD-10-CM

## 2023-12-04 DIAGNOSIS — D214 Benign neoplasm of connective and other soft tissue of abdomen: Secondary | ICD-10-CM

## 2023-12-04 LAB — CBC WITH DIFFERENTIAL (CANCER CENTER ONLY)
Abs Immature Granulocytes: 0.03 K/uL (ref 0.00–0.07)
Basophils Absolute: 0 K/uL (ref 0.0–0.1)
Basophils Relative: 1 %
Eosinophils Absolute: 0.4 K/uL (ref 0.0–0.5)
Eosinophils Relative: 4 %
HCT: 39.3 % (ref 36.0–46.0)
Hemoglobin: 12.8 g/dL (ref 12.0–15.0)
Immature Granulocytes: 0 %
Lymphocytes Relative: 30 %
Lymphs Abs: 2.6 K/uL (ref 0.7–4.0)
MCH: 29.3 pg (ref 26.0–34.0)
MCHC: 32.6 g/dL (ref 30.0–36.0)
MCV: 89.9 fL (ref 80.0–100.0)
Monocytes Absolute: 0.5 K/uL (ref 0.1–1.0)
Monocytes Relative: 6 %
Neutro Abs: 5.1 K/uL (ref 1.7–7.7)
Neutrophils Relative %: 59 %
Platelet Count: 274 K/uL (ref 150–400)
RBC: 4.37 MIL/uL (ref 3.87–5.11)
RDW: 13.4 % (ref 11.5–15.5)
WBC Count: 8.6 K/uL (ref 4.0–10.5)
nRBC: 0 % (ref 0.0–0.2)

## 2023-12-04 LAB — FERRITIN: Ferritin: 95 ng/mL (ref 11–307)

## 2023-12-04 LAB — IRON AND TIBC
Iron: 72 ug/dL (ref 28–170)
Saturation Ratios: 21 % (ref 10.4–31.8)
TIBC: 343 ug/dL (ref 250–450)
UIBC: 271 ug/dL

## 2023-12-04 NOTE — Progress Notes (Signed)
 Patient has no new or acute concerns at this time.

## 2023-12-07 ENCOUNTER — Encounter: Payer: Self-pay | Admitting: Oncology

## 2023-12-07 NOTE — Progress Notes (Signed)
 Hematology/Oncology Consult note [facility]  Telephone:([phone] [phone]  Patient Care Team: Rudolpho Norleen BIRCH, MD as PCP - General (Internal Medicine) Bosie Vinie LABOR, MD as Consulting Physician (Cardiology) Melanee Annah BROCKS, MD as Consulting Physician (Oncology)   Name of the patient: Brianna Harris  [id number]  1960/05/23   Date of visit: 12/07/23  Diagnosis-  1. 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 H/o iron deficiency anemia 3. Gastric leiomyoma  Chief complaint/ Reason for visit-routine follow-up of iron deficiency anemia and gastric leiomyoma  Heme/Onc history: Patient is a 63 year old female who have seen for leukocytosis since August 2019.  BCR ABL testing was negative for CML and peripheral flow cytometry showed no significant immunophenotypic abnormality.  Differential mainly shows neutrophilia and lymphocytosis and white count has been relatively stable between 11-15 over the last 4 years.    Patient has also been seeing me for her anemia and underwent EGD and colonoscopy with GI.  Colonoscopy showed 1 nonbleeding AVM which was treated with APC.Also had biopsies of duodenum and stomach.  Stomach biopsy showed intestinal metaplasia but otherwise negative for celiac dysplasia or malignancy. She also underwent heart valve surgery is on Coumadin.  She has received IV iron in the past  Patient underwent CT chest abdomen pelvis with contrast in April 2025 as a part of her fall workup.  She was incidentally noted to have a 2 cm ovoid gastric Strick mass in the cardia which was present slightly even dating back to November 2010.  Patient underwent EUS on 08/22/2023 by Dr. Elta.  EUS showed 20 mm submucosal papule in the cardia which was hypoechoic.  Outer endosonographic borders were well-defined.  Sonographic evidence suggesting invasion into muscularis propria layer 4.  This was biopsied and was consistent with spindle cell fragments  without significant atypia or increased mitotic activity.  Findings raise the possibility of GIST.    Patient was seen by Dr. Claude from [facility] in July 2025.  She underwent a second EUS biopsy which was consistent with a leiomyoma and not just as was previously suggested and therefore surgical management was not recommended  Interval history-history obtained with the help of sign language interpreter.  She is here with her roommate today.  Overall she is doing well and denies any specific complaints at this time  ECOG PS- 1 Pain scale- 0   Review of systems- Review of Systems  Constitutional:  Negative for chills, fever, malaise/fatigue and weight loss.  HENT:  Negative for congestion, ear discharge and nosebleeds.   Eyes:  Negative for blurred vision.  Respiratory:  Negative for cough, hemoptysis, sputum production, shortness of breath and wheezing.   Cardiovascular:  Negative for chest pain, palpitations, orthopnea and claudication.  Gastrointestinal:  Negative for abdominal pain, blood in stool, constipation, diarrhea, heartburn, melena, nausea and vomiting.  Genitourinary:  Negative for dysuria, flank pain, frequency, hematuria and urgency.  Musculoskeletal:  Negative for back pain, joint pain and myalgias.  Skin:  Negative for rash.  Neurological:  Negative for dizziness, tingling, focal weakness, seizures, weakness and headaches.  Endo/Heme/Allergies:  Does not bruise/bleed easily.  Psychiatric/Behavioral:  Negative for depression and suicidal ideas. The patient does not have insomnia.       No Known Allergies   Past Medical History:  Diagnosis Date   Asthma    Deafness    Depression    Diabetes mellitus without complication (HCC)    Family history of adverse reaction to anesthesia  Mother - PONV   GERD (gastroesophageal reflux disease)    Heart abnormality    per mother pt has bad heart murmor- sees Dr Bosie    Hyperlipidemia    Hypertension    Mechanical heart valve  present    PONV (postoperative nausea and vomiting)    Presence of permanent cardiac pacemaker    Staring episodes      Past Surgical History:  Procedure Laterality Date   ABDOMINAL HYSTERECTOMY  2000   per mother [facility] thought pt cancer and did hyster ,cervix and ovaries   CARDIAC VALVE REPLACEMENT     CATARACT EXTRACTION W/PHACO Left 02/14/2021   Procedure: CATARACT EXTRACTION PHACO AND INTRAOCULAR LENS PLACEMENT (IOC) Left DIABETIC;  Surgeon: Jaye Fallow, MD;  Location: [facility];  Service: Ophthalmology;  Laterality: Left;  leave arrival at 9 sign language interpreter has been requested  kp 6.12 00:39.2   CHOLECYSTECTOMY     COLONOSCOPY WITH PROPOFOL N/A 08/17/2016   Procedure: COLONOSCOPY WITH PROPOFOL;  Surgeon: Lamar ONEIDA Holmes, MD;  Location: [facility] ENDOSCOPY;  Service: Endoscopy;  Laterality: N/A;   COLONOSCOPY WITH PROPOFOL N/A 02/02/2020   Procedure: COLONOSCOPY WITH PROPOFOL;  Surgeon: Unk Corinn Skiff, MD;  Location: [facility] ENDOSCOPY;  Service: Gastroenterology;  Laterality: N/A;   ESOPHAGOGASTRODUODENOSCOPY (EGD) WITH PROPOFOL N/A 08/17/2016   Procedure: ESOPHAGOGASTRODUODENOSCOPY (EGD) WITH PROPOFOL;  Surgeon: Lamar ONEIDA Holmes, MD;  Location: [facility] ENDOSCOPY;  Service: Endoscopy;  Laterality: N/A;   ESOPHAGOGASTRODUODENOSCOPY (EGD) WITH PROPOFOL  02/02/2020   Procedure: ESOPHAGOGASTRODUODENOSCOPY (EGD) WITH PROPOFOL;  Surgeon: Unk Corinn Skiff, MD;  Location: [facility] ENDOSCOPY;  Service: Gastroenterology;;   EUS N/A 08/22/2023   Procedure: ULTRASOUND, UPPER GI TRACT, ENDOSCOPIC;  Surgeon: Elta Fonda SQUIBB, MD;  Location: [facility] ENDOSCOPY;  Service: Gastroenterology;  Laterality: N/A;  MOTHER REQUESTING (IN PERSON) SIGN INTERPRETER - TRISH HAS REQUESTED (ASL) SIGN INTERPRETER FOR 1 PM FOR THURSDAY, MAY 1ST   PACEMAKER IMPLANT  01/2018    Social History   Socioeconomic History   Marital status: Single    Spouse name: Not on file   Number of children: Not on  file   Years of education: Not on file   Highest education level: Not on file  Occupational History   Not on file  Tobacco Use   Smoking status: Former    Current packs/day: 0.00    Average packs/day: 1.5 packs/day for 39.0 years (58.5 ttl pk-yrs)    Types: Cigarettes    Start date: 02/14/1979    Quit date: 02/13/2018    Years since quitting: 5.8   Smokeless tobacco: Never  Vaping Use   Vaping status: Former  Substance and Sexual Activity   Alcohol use: Not Currently    Comment: occasional wine   Drug use: Not Currently    Types: Marijuana    Comment: not for the past 5 years 2020   Sexual activity: Never  Other Topics Concern   Not on file  Social History Narrative   Not on file   Social Drivers of Health   Financial Resource Strain: Low Risk  (11/11/2023)   Received from [facility]   Overall Financial Resource Strain (CARDIA)    Difficulty of Paying Living Expenses: Not hard at all  Food Insecurity: No Food Insecurity (11/11/2023)   Received from [facility]   Hunger Vital Sign    Within the past 12 months, you worried that your food would run out before you got the money to buy more.: Never  true    Within the past 12 months, the food you bought just didn't last and you didn't have money to get more.: Never true  Transportation Needs: No Transportation Needs (11/11/2023)   Received from [facility] - Transportation    In the past 12 months, has lack of transportation kept you from medical appointments or from getting medications?: No    Lack of Transportation (Non-Medical): No  Physical Activity: Not on file  Stress: Not on file  Social Connections: Not on file  Intimate Partner Violence: Not At Risk (10/07/2022)   Humiliation, Afraid, Rape, and Kick questionnaire    Fear of Current or Ex-Partner: No    Emotionally Abused: No    Physically Abused: No    Sexually Abused: No    Family History  Problem  Relation Age of Onset   Diabetes Maternal Uncle    Heart attack Father    Kidney cancer Paternal Uncle    Breast cancer Neg Hx    Bladder Cancer Neg Hx      Current Outpatient Medications:    acetaminophen (TYLENOL) 500 MG tablet, Take 1,000 mg by mouth every 6 (six) hours as needed for moderate pain., Disp: , Rfl:    amitriptyline (ELAVIL) 25 MG tablet, Take 25 mg by mouth at bedtime., Disp: , Rfl:    ARIPiprazole (ABILIFY) 5 MG tablet, FOR THE FIRST WEEK, TAKE 1/2 TABLET BY MOUTH AT BEDTIME THEN INCREASE TO 1 TABLET AT BEDTIME, Disp: , Rfl:    atorvastatin (LIPITOR) 40 MG tablet, Take 40 mg by mouth at bedtime., Disp: , Rfl:    cyclobenzaprine (FLEXERIL) 5 MG tablet, Take 1 tablet (5 mg total) by mouth 3 (three) times daily as needed for muscle spasms., Disp: 30 tablet, Rfl: 0   dextromethorphan-guaiFENesin (MUCINEX DM) 30-600 MG 12hr tablet, Take 1 tablet by mouth 2 (two) times daily as needed for cough., Disp: 30 tablet, Rfl: 0   Dulaglutide 0.75 MG/0.5ML SOPN, Inject 0.75 mg into the skin once a week., Disp: , Rfl:    enoxaparin (LOVENOX) 60 MG/0.6ML injection, SMARTSIG:0.6 Milliliter(s) SUB-Q Every 12 Hours, Disp: , Rfl:    escitalopram (LEXAPRO) 10 MG tablet, Take 10 mg by mouth daily., Disp: , Rfl:    HYDROcodone bit-homatropine (HYCODAN) 5-1.5 MG/5ML syrup, Take 5 mLs by mouth every 8 (eight) hours as needed for cough., Disp: 120 mL, Rfl: 0   lamoTRIgine (LAMICTAL) 100 MG tablet, Take 100 mg by mouth 2 (two) times daily., Disp: , Rfl:    metFORMIN (GLUCOPHAGE-XR) 500 MG 24 hr tablet, Take 1,000 mg by mouth 2 (two) times daily., Disp: , Rfl:    metoprolol tartrate (LOPRESSOR) 25 MG tablet, SMARTSIG:1 Tablet(s) By Mouth Every 12 Hours, Disp: , Rfl:    mirabegron ER (MYRBETRIQ) 50 MG TB24 tablet, Take 1 tablet (50 mg total) by mouth daily., Disp: 30 tablet, Rfl: 0   nystatin cream (MYCOSTATIN), Apply topically 2 (two) times daily., Disp: , Rfl:    omeprazole (PRILOSEC) 40 MG capsule,  Take 40 mg by mouth daily., Disp: , Rfl:    potassium chloride SA (KLOR-CON M) 20 MEQ tablet, Take 1 tablet (20 mEq total) by mouth 2 (two) times daily for 5 days., Disp: 10 tablet, Rfl: 0   traZODone (DESYREL) 50 MG tablet, Take 1 tablet by mouth at bedtime., Disp: , Rfl:    warfarin (COUMADIN) 1 MG tablet, Take 1 mg by mouth daily. (Take with 3mg  tablet to equal 4mg  total),  Disp: , Rfl:    warfarin (COUMADIN) 3 MG tablet, Take 3 mg by mouth daily. (Take with 1mg  tablet to equal 4mg  total), Disp: , Rfl:   Physical exam:  Vitals:   12/04/23 1325 12/04/23 1335  BP: (!) 143/66 (!) 147/75  Pulse: 61 73  Resp: 18   Temp: (!) 96.6 F (35.9 C)   TempSrc: Tympanic   SpO2: 96%   Weight: 126 lb 4.8 oz (57.3 kg)   Height: 5' 2 (1.575 m)    Physical Exam Cardiovascular:     Rate and Rhythm: Normal rate and regular rhythm.     Heart sounds: Normal heart sounds.  Pulmonary:     Effort: Pulmonary effort is normal.     Breath sounds: Normal breath sounds.  Skin:    General: Skin is warm and dry.  Neurological:     Mental Status: She is alert and oriented to person, place, and time.      I have personally reviewed labs listed below:    Latest Ref Rng & Units 08/09/2023    7:38 AM  CMP  Glucose 70 - 99 mg/dL 880   BUN 6 - 20 mg/dL 23   Creatinine 9.55 - 1.00 mg/dL 9.05   Sodium 864 - 854 mmol/L 135   Potassium 3.5 - 5.1 mmol/L 3.4   Chloride 98 - 111 mmol/L 99   CO2 22 - 32 mmol/L 27   Calcium 8.9 - 10.3 mg/dL 9.5   Total Protein 6.5 - 8.1 g/dL 7.4   Total Bilirubin 0.0 - 1.2 mg/dL 1.4   Alkaline Phos 38 - 126 U/L 63   AST 15 - 41 U/L 22   ALT 0 - 44 U/L 17       Latest Ref Rng & Units 12/04/2023    1:00 PM  CBC  WBC 4.0 - 10.5 K/uL 8.6   Hemoglobin 12.0 - 15.0 g/dL 87.1   Hematocrit 63.9 - 46.0 % 39.3   Platelets 150 - 400 K/uL 274       Assessment and plan- Patient is a 63 y.o. female here for routine follow up of  following issues:  H/o iron deficiency anemia: she  is not anemic presently with h/h of 12.8/39.3. ferritin is normal at 95. Iron studies are normal. She does not require IV iron at this time. Labs in 6 months and 1 year. See me in1  year  2. Gastric leiomyoma- f/u CT in April 2026.    Visit Diagnosis 1. Iron deficiency anemia, unspecified iron deficiency anemia type   2. Gastric leiomyoma      Dr. Annah Skene, MD, MPH [facility] [phone] 12/07/2023 4:52 PM

## 2023-12-09 ENCOUNTER — Encounter: Payer: Self-pay | Admitting: Oncology

## 2023-12-11 DIAGNOSIS — Z7901 Long term (current) use of anticoagulants: Secondary | ICD-10-CM | POA: Diagnosis not present

## 2023-12-11 DIAGNOSIS — Z952 Presence of prosthetic heart valve: Secondary | ICD-10-CM | POA: Diagnosis not present

## 2023-12-12 DIAGNOSIS — E1165 Type 2 diabetes mellitus with hyperglycemia: Secondary | ICD-10-CM | POA: Diagnosis not present

## 2023-12-16 DIAGNOSIS — Z961 Presence of intraocular lens: Secondary | ICD-10-CM | POA: Diagnosis not present

## 2023-12-16 DIAGNOSIS — H0015 Chalazion left lower eyelid: Secondary | ICD-10-CM | POA: Diagnosis not present

## 2023-12-16 DIAGNOSIS — D2310 Other benign neoplasm of skin of unspecified eyelid, including canthus: Secondary | ICD-10-CM | POA: Diagnosis not present

## 2023-12-16 DIAGNOSIS — H2511 Age-related nuclear cataract, right eye: Secondary | ICD-10-CM | POA: Diagnosis not present

## 2023-12-18 DIAGNOSIS — I10 Essential (primary) hypertension: Secondary | ICD-10-CM | POA: Diagnosis not present

## 2023-12-18 DIAGNOSIS — Z1331 Encounter for screening for depression: Secondary | ICD-10-CM | POA: Diagnosis not present

## 2023-12-18 DIAGNOSIS — Z0000 Encounter for general adult medical examination without abnormal findings: Secondary | ICD-10-CM | POA: Diagnosis not present

## 2023-12-18 DIAGNOSIS — F32A Depression, unspecified: Secondary | ICD-10-CM | POA: Diagnosis not present

## 2023-12-18 DIAGNOSIS — I350 Nonrheumatic aortic (valve) stenosis: Secondary | ICD-10-CM | POA: Diagnosis not present

## 2023-12-18 DIAGNOSIS — Z0001 Encounter for general adult medical examination with abnormal findings: Secondary | ICD-10-CM | POA: Diagnosis not present

## 2023-12-18 DIAGNOSIS — Z23 Encounter for immunization: Secondary | ICD-10-CM | POA: Diagnosis not present

## 2023-12-18 DIAGNOSIS — J4520 Mild intermittent asthma, uncomplicated: Secondary | ICD-10-CM | POA: Diagnosis not present

## 2023-12-18 DIAGNOSIS — F339 Major depressive disorder, recurrent, unspecified: Secondary | ICD-10-CM | POA: Diagnosis not present

## 2023-12-18 DIAGNOSIS — E1165 Type 2 diabetes mellitus with hyperglycemia: Secondary | ICD-10-CM | POA: Diagnosis not present

## 2023-12-18 DIAGNOSIS — H9193 Unspecified hearing loss, bilateral: Secondary | ICD-10-CM | POA: Diagnosis not present

## 2023-12-18 DIAGNOSIS — Z952 Presence of prosthetic heart valve: Secondary | ICD-10-CM | POA: Diagnosis not present

## 2023-12-18 DIAGNOSIS — E7801 Familial hypercholesterolemia: Secondary | ICD-10-CM | POA: Diagnosis not present

## 2023-12-25 DIAGNOSIS — E1165 Type 2 diabetes mellitus with hyperglycemia: Secondary | ICD-10-CM | POA: Diagnosis not present

## 2023-12-25 DIAGNOSIS — G40109 Localization-related (focal) (partial) symptomatic epilepsy and epileptic syndromes with simple partial seizures, not intractable, without status epilepticus: Secondary | ICD-10-CM | POA: Diagnosis not present

## 2023-12-25 DIAGNOSIS — I700 Atherosclerosis of aorta: Secondary | ICD-10-CM | POA: Diagnosis not present

## 2023-12-25 DIAGNOSIS — E7801 Familial hypercholesterolemia: Secondary | ICD-10-CM | POA: Diagnosis not present

## 2023-12-25 DIAGNOSIS — J4520 Mild intermittent asthma, uncomplicated: Secondary | ICD-10-CM | POA: Diagnosis not present

## 2023-12-25 DIAGNOSIS — J4489 Other specified chronic obstructive pulmonary disease: Secondary | ICD-10-CM | POA: Diagnosis not present

## 2023-12-25 DIAGNOSIS — Z87891 Personal history of nicotine dependence: Secondary | ICD-10-CM | POA: Diagnosis not present

## 2023-12-25 DIAGNOSIS — K760 Fatty (change of) liver, not elsewhere classified: Secondary | ICD-10-CM | POA: Diagnosis not present

## 2023-12-25 DIAGNOSIS — Z7901 Long term (current) use of anticoagulants: Secondary | ICD-10-CM | POA: Diagnosis not present

## 2023-12-25 DIAGNOSIS — I10 Essential (primary) hypertension: Secondary | ICD-10-CM | POA: Diagnosis not present

## 2023-12-25 DIAGNOSIS — Z952 Presence of prosthetic heart valve: Secondary | ICD-10-CM | POA: Diagnosis not present

## 2023-12-25 DIAGNOSIS — H9193 Unspecified hearing loss, bilateral: Secondary | ICD-10-CM | POA: Diagnosis not present

## 2023-12-25 DIAGNOSIS — Z8673 Personal history of transient ischemic attack (TIA), and cerebral infarction without residual deficits: Secondary | ICD-10-CM | POA: Diagnosis not present

## 2023-12-25 DIAGNOSIS — Z604 Social exclusion and rejection: Secondary | ICD-10-CM | POA: Diagnosis not present

## 2023-12-25 DIAGNOSIS — E78019 Familial hypercholesterolemia, unspecified: Secondary | ICD-10-CM | POA: Diagnosis not present

## 2023-12-25 DIAGNOSIS — Z7984 Long term (current) use of oral hypoglycemic drugs: Secondary | ICD-10-CM | POA: Diagnosis not present

## 2023-12-25 DIAGNOSIS — D509 Iron deficiency anemia, unspecified: Secondary | ICD-10-CM | POA: Diagnosis not present

## 2023-12-25 DIAGNOSIS — F32A Depression, unspecified: Secondary | ICD-10-CM | POA: Diagnosis not present

## 2023-12-25 DIAGNOSIS — Z556 Problems related to health literacy: Secondary | ICD-10-CM | POA: Diagnosis not present

## 2023-12-31 DIAGNOSIS — Z7901 Long term (current) use of anticoagulants: Secondary | ICD-10-CM | POA: Diagnosis not present

## 2023-12-31 DIAGNOSIS — Z952 Presence of prosthetic heart valve: Secondary | ICD-10-CM | POA: Diagnosis not present

## 2024-01-06 DIAGNOSIS — I10 Essential (primary) hypertension: Secondary | ICD-10-CM | POA: Diagnosis not present

## 2024-01-06 DIAGNOSIS — E1165 Type 2 diabetes mellitus with hyperglycemia: Secondary | ICD-10-CM | POA: Diagnosis not present

## 2024-01-06 DIAGNOSIS — Z950 Presence of cardiac pacemaker: Secondary | ICD-10-CM | POA: Diagnosis not present

## 2024-01-06 DIAGNOSIS — E7801 Familial hypercholesterolemia: Secondary | ICD-10-CM | POA: Diagnosis not present

## 2024-01-06 DIAGNOSIS — Z952 Presence of prosthetic heart valve: Secondary | ICD-10-CM | POA: Diagnosis not present

## 2024-01-07 DIAGNOSIS — J4520 Mild intermittent asthma, uncomplicated: Secondary | ICD-10-CM | POA: Diagnosis not present

## 2024-01-07 DIAGNOSIS — I700 Atherosclerosis of aorta: Secondary | ICD-10-CM | POA: Diagnosis not present

## 2024-01-07 DIAGNOSIS — G40109 Localization-related (focal) (partial) symptomatic epilepsy and epileptic syndromes with simple partial seizures, not intractable, without status epilepticus: Secondary | ICD-10-CM | POA: Diagnosis not present

## 2024-01-07 DIAGNOSIS — K760 Fatty (change of) liver, not elsewhere classified: Secondary | ICD-10-CM | POA: Diagnosis not present

## 2024-01-07 DIAGNOSIS — J4489 Other specified chronic obstructive pulmonary disease: Secondary | ICD-10-CM | POA: Diagnosis not present

## 2024-01-07 DIAGNOSIS — I10 Essential (primary) hypertension: Secondary | ICD-10-CM | POA: Diagnosis not present

## 2024-01-07 DIAGNOSIS — E1165 Type 2 diabetes mellitus with hyperglycemia: Secondary | ICD-10-CM | POA: Diagnosis not present

## 2024-01-22 DIAGNOSIS — Z952 Presence of prosthetic heart valve: Secondary | ICD-10-CM | POA: Diagnosis not present

## 2024-01-30 DIAGNOSIS — H26492 Other secondary cataract, left eye: Secondary | ICD-10-CM | POA: Diagnosis not present

## 2024-01-30 DIAGNOSIS — H2511 Age-related nuclear cataract, right eye: Secondary | ICD-10-CM | POA: Diagnosis not present

## 2024-01-30 DIAGNOSIS — E119 Type 2 diabetes mellitus without complications: Secondary | ICD-10-CM | POA: Diagnosis not present

## 2024-01-30 DIAGNOSIS — Z961 Presence of intraocular lens: Secondary | ICD-10-CM | POA: Diagnosis not present

## 2024-02-04 DIAGNOSIS — Z952 Presence of prosthetic heart valve: Secondary | ICD-10-CM | POA: Diagnosis not present

## 2024-02-04 DIAGNOSIS — Z8669 Personal history of other diseases of the nervous system and sense organs: Secondary | ICD-10-CM | POA: Diagnosis not present

## 2024-02-04 DIAGNOSIS — R197 Diarrhea, unspecified: Secondary | ICD-10-CM | POA: Diagnosis not present

## 2024-02-04 DIAGNOSIS — F32A Depression, unspecified: Secondary | ICD-10-CM | POA: Diagnosis not present

## 2024-02-04 DIAGNOSIS — F339 Major depressive disorder, recurrent, unspecified: Secondary | ICD-10-CM | POA: Diagnosis not present

## 2024-02-25 DIAGNOSIS — Z7901 Long term (current) use of anticoagulants: Secondary | ICD-10-CM | POA: Diagnosis not present

## 2024-02-25 DIAGNOSIS — Z952 Presence of prosthetic heart valve: Secondary | ICD-10-CM | POA: Diagnosis not present

## 2024-02-27 DIAGNOSIS — Z7901 Long term (current) use of anticoagulants: Secondary | ICD-10-CM | POA: Diagnosis not present

## 2024-02-27 DIAGNOSIS — Z952 Presence of prosthetic heart valve: Secondary | ICD-10-CM | POA: Diagnosis not present

## 2024-03-05 DIAGNOSIS — Z952 Presence of prosthetic heart valve: Secondary | ICD-10-CM | POA: Diagnosis not present

## 2024-03-05 DIAGNOSIS — E119 Type 2 diabetes mellitus without complications: Secondary | ICD-10-CM | POA: Diagnosis not present

## 2024-03-12 ENCOUNTER — Emergency Department
Admission: EM | Admit: 2024-03-12 | Discharge: 2024-03-13 | Disposition: A | Discharge status: Home / Self Care | Attending: Emergency Medicine | Admitting: Emergency Medicine

## 2024-03-12 ENCOUNTER — Other Ambulatory Visit: Payer: Self-pay

## 2024-03-12 ENCOUNTER — Emergency Department

## 2024-03-12 DIAGNOSIS — Z950 Presence of cardiac pacemaker: Secondary | ICD-10-CM | POA: Insufficient documentation

## 2024-03-12 DIAGNOSIS — E119 Type 2 diabetes mellitus without complications: Secondary | ICD-10-CM | POA: Insufficient documentation

## 2024-03-12 DIAGNOSIS — R110 Nausea: Secondary | ICD-10-CM | POA: Diagnosis not present

## 2024-03-12 DIAGNOSIS — M545 Low back pain, unspecified: Secondary | ICD-10-CM | POA: Insufficient documentation

## 2024-03-12 DIAGNOSIS — R1031 Right lower quadrant pain: Secondary | ICD-10-CM | POA: Diagnosis not present

## 2024-03-12 DIAGNOSIS — Z79899 Other long term (current) drug therapy: Secondary | ICD-10-CM | POA: Diagnosis not present

## 2024-03-12 DIAGNOSIS — J45909 Unspecified asthma, uncomplicated: Secondary | ICD-10-CM | POA: Insufficient documentation

## 2024-03-12 DIAGNOSIS — Z7984 Long term (current) use of oral hypoglycemic drugs: Secondary | ICD-10-CM | POA: Insufficient documentation

## 2024-03-12 DIAGNOSIS — I10 Essential (primary) hypertension: Secondary | ICD-10-CM | POA: Insufficient documentation

## 2024-03-12 DIAGNOSIS — R197 Diarrhea, unspecified: Secondary | ICD-10-CM | POA: Insufficient documentation

## 2024-03-12 DIAGNOSIS — Z7901 Long term (current) use of anticoagulants: Secondary | ICD-10-CM | POA: Insufficient documentation

## 2024-03-12 DIAGNOSIS — Z794 Long term (current) use of insulin: Secondary | ICD-10-CM | POA: Insufficient documentation

## 2024-03-12 DIAGNOSIS — R300 Dysuria: Secondary | ICD-10-CM | POA: Diagnosis not present

## 2024-03-12 LAB — BASIC METABOLIC PANEL WITH GFR
Anion gap: 12 (ref 5–15)
BUN: 20 mg/dL (ref 6–20)
CO2: 26 mmol/L (ref 22–32)
Calcium: 9.5 mg/dL (ref 8.9–10.3)
Chloride: 103 mmol/L (ref 98–111)
Creatinine, Ser: 0.78 mg/dL (ref 0.44–1.00)
GFR, Estimated: >60 mL/min (ref >60)
Glucose, Bld: 146 mg/dL — ABNORMAL HIGH (ref 70–99)
Potassium: 3.4 mmol/L — ABNORMAL LOW (ref 3.5–5.1)
Sodium: 140 mmol/L (ref 135–145)

## 2024-03-12 LAB — CBC
HCT: 39.3 % (ref 36.0–46.0)
Hemoglobin: 13.2 g/dL (ref 12.0–15.0)
MCH: 29.5 pg (ref 26.0–34.0)
MCHC: 33.6 g/dL (ref 30.0–36.0)
MCV: 87.7 fL (ref 80.0–100.0)
Platelets: 327 K/uL (ref 150–400)
RBC: 4.48 MIL/uL (ref 3.87–5.11)
RDW: 13.3 % (ref 11.5–15.5)
WBC: 10.9 K/uL — ABNORMAL HIGH (ref 4.0–10.5)
nRBC: 0 % (ref 0.0–0.2)

## 2024-03-12 MED ORDER — SODIUM CHLORIDE 0.9 % IV BOLUS (SEPSIS)
500.0000 mL | Freq: Once | INTRAVENOUS | Status: AC
  Administered 2024-03-12: 500 mL via INTRAVENOUS

## 2024-03-12 MED ORDER — KETOROLAC TROMETHAMINE 30 MG/ML IJ SOLN
30.0000 mg | Freq: Once | INTRAMUSCULAR | Status: AC
  Administered 2024-03-12: 30 mg via INTRAVENOUS
  Filled 2024-03-12: qty 1

## 2024-03-12 NOTE — ED Triage Notes (Signed)
 Patient wheeled to triage with complaints of left lower back pain and burning when she pees. States this has been ongoing for  a couple of days. Denis hx of kidney stones. Interpreter [phone] used for triage.

## 2024-03-12 NOTE — ED Provider Notes (Signed)
 [facility] Provider Note    Event Date/Time   First MD Initiated Contact with Patient 03/12/24 2258     (approximate)   History   Dysuria and Flank Pain   HPI  Brianna Harris is a 63 y.o. female with history of deafness, hypertension, diabetes, hyperlipidemia, aortic valve replacement on Coumadin  who presents to the emergency department with complaints of right low back pain that radiates into the right lower abdomen.  She denies any injury to her back but it is worse with movement.  She is worried she could have a kidney infection or kidney stone.  Denies prior history of either.  States she did have some dysuria but no gross hematuria.  Has had nausea and some diarrhea but no vomiting.  Denies numbness, tingling, weakness, bowel or bladder incontinence.  Is able to ambulate.   History provided by patient and mother using ASL interpreter.    Past Medical History:  Diagnosis Date   Asthma    Deafness    Depression    Diabetes mellitus without complication (HCC)    Family history of adverse reaction to anesthesia    Mother - PONV   GERD (gastroesophageal reflux disease)    Heart abnormality    per mother pt has bad heart murmor- sees Dr Bosie    Hyperlipidemia    Hypertension    Mechanical heart valve present    PONV (postoperative nausea and vomiting)    Presence of permanent cardiac pacemaker    Staring episodes     Past Surgical History:  Procedure Laterality Date   ABDOMINAL HYSTERECTOMY  2000   per mother [facility] thought pt cancer and did hyster ,cervix and ovaries   CARDIAC VALVE REPLACEMENT     CATARACT EXTRACTION W/PHACO Left 02/14/2021   Procedure: CATARACT EXTRACTION PHACO AND INTRAOCULAR LENS PLACEMENT (IOC) Left DIABETIC;  Surgeon: Jaye Fallow, MD;  Location: [facility];  Service: Ophthalmology;  Laterality: Left;  leave arrival at 9 sign language interpreter has been requested  kp 6.12 00:39.2   CHOLECYSTECTOMY      COLONOSCOPY WITH PROPOFOL  N/A 08/17/2016   Procedure: COLONOSCOPY WITH PROPOFOL ;  Surgeon: Lamar ONEIDA Holmes, MD;  Location: [facility] ENDOSCOPY;  Service: Endoscopy;  Laterality: N/A;   COLONOSCOPY WITH PROPOFOL  N/A 02/02/2020   Procedure: COLONOSCOPY WITH PROPOFOL ;  Surgeon: Unk Corinn Skiff, MD;  Location: [facility] ENDOSCOPY;  Service: Gastroenterology;  Laterality: N/A;   ESOPHAGOGASTRODUODENOSCOPY (EGD) WITH PROPOFOL  N/A 08/17/2016   Procedure: ESOPHAGOGASTRODUODENOSCOPY (EGD) WITH PROPOFOL ;  Surgeon: Lamar ONEIDA Holmes, MD;  Location: [facility] ENDOSCOPY;  Service: Endoscopy;  Laterality: N/A;   ESOPHAGOGASTRODUODENOSCOPY (EGD) WITH PROPOFOL   02/02/2020   Procedure: ESOPHAGOGASTRODUODENOSCOPY (EGD) WITH PROPOFOL ;  Surgeon: Unk Corinn Skiff, MD;  Location: [facility] ENDOSCOPY;  Service: Gastroenterology;;   EUS N/A 08/22/2023   Procedure: ULTRASOUND, UPPER GI TRACT, ENDOSCOPIC;  Surgeon: Elta Fonda SQUIBB, MD;  Location: [facility] ENDOSCOPY;  Service: Gastroenterology;  Laterality: N/A;  MOTHER REQUESTING (IN PERSON) SIGN INTERPRETER - TRISH HAS REQUESTED (ASL) SIGN INTERPRETER FOR 1 PM FOR THURSDAY, MAY 1ST   PACEMAKER IMPLANT  01/2018    MEDICATIONS:  Prior to Admission medications   Medication Sig Start Date End Date Taking? Authorizing Provider  acetaminophen  (TYLENOL ) 500 MG tablet Take 1,000 mg by mouth every 6 (six) hours as needed for moderate pain.    [provider]  amitriptyline  (ELAVIL ) 25 MG tablet Take 25 mg by mouth at bedtime.    [provider]  ARIPiprazole (ABILIFY) 5  MG tablet FOR THE FIRST WEEK, TAKE 1/2 TABLET BY MOUTH AT BEDTIME THEN INCREASE TO 1 TABLET AT BEDTIME 07/18/23   [provider]  atorvastatin  (LIPITOR) 40 MG tablet Take 40 mg by mouth at bedtime.    [provider]  cyclobenzaprine  (FLEXERIL ) 5 MG tablet Take 1 tablet (5 mg total) by mouth 3 (three) times daily as needed for muscle spasms. 02/19/23   Malvina Alm DASEN, MD   dextromethorphan -guaiFENesin  (MUCINEX  DM) 30-600 MG 12hr tablet Take 1 tablet by mouth 2 (two) times daily as needed for cough. 10/07/22   Alexander, Natalie, DO  Dulaglutide  0.75 MG/0.5ML SOPN Inject 0.75 mg into the skin once a week.    [provider]  enoxaparin  (LOVENOX ) 60 MG/0.6ML injection SMARTSIG:0.6 Milliliter(s) SUB-Q Every 12 Hours 08/20/23   [provider]  escitalopram (LEXAPRO) 10 MG tablet Take 10 mg by mouth daily.    [provider]  HYDROcodone  bit-homatropine (HYCODAN) 5-1.5 MG/5ML syrup Take 5 mLs by mouth every 8 (eight) hours as needed for cough. 10/08/22   Alexander, Natalie, DO  lamoTRIgine  (LAMICTAL ) 100 MG tablet Take 100 mg by mouth 2 (two) times daily.    [provider]  metFORMIN  (GLUCOPHAGE -XR) 500 MG 24 hr tablet Take 1,000 mg by mouth 2 (two) times daily.    [provider]  metoprolol  tartrate (LOPRESSOR ) 25 MG tablet SMARTSIG:1 Tablet(s) By Mouth Every 12 Hours 08/16/23   [provider]  mirabegron  ER (MYRBETRIQ ) 50 MG TB24 tablet Take 1 tablet (50 mg total) by mouth daily. 11/07/22   Stoioff, Scott C, MD  nystatin cream (MYCOSTATIN) Apply topically 2 (two) times daily. 12/15/21   [provider]  omeprazole (PRILOSEC) 40 MG capsule Take 40 mg by mouth daily.    [provider]  potassium chloride  SA (KLOR-CON  M) 20 MEQ tablet Take 1 tablet (20 mEq total) by mouth 2 (two) times daily for 5 days. 10/07/22 12/04/23  Alexander, Natalie, DO  traZODone  (DESYREL ) 50 MG tablet Take 1 tablet by mouth at bedtime. 08/21/23 08/20/24  [provider]  warfarin (COUMADIN ) 1 MG tablet Take 1 mg by mouth daily. (Take with 3mg  tablet to equal 4mg  total)    [provider]  warfarin (COUMADIN ) 3 MG tablet Take 3 mg by mouth daily. (Take with 1mg  tablet to equal 4mg  total)    [provider]    Physical Exam   Triage Vital Signs: ED Triage Vitals  Encounter Vitals Group     BP  03/12/24 2226 (!) 152/106     Girls Systolic BP Percentile --      Girls Diastolic BP Percentile --      Boys Systolic BP Percentile --      Boys Diastolic BP Percentile --      Pulse Rate 03/12/24 2226 80     Resp 03/12/24 2226 18     Temp 03/12/24 2226 97.9 F (36.6 C)     Temp Source 03/12/24 2226 Oral     SpO2 03/12/24 2226 98 %     Weight 03/12/24 2228 121 lb (54.9 kg)     Height 03/12/24 2228 5' 2 (1.575 m)     Head Circumference --      Peak Flow --      Pain Score 03/12/24 2227 10     Pain Loc --      Pain Education --      Exclude from Growth Chart --     Most recent vital  signs: Vitals:   03/12/24 2226  BP: (!) 152/106  Pulse: 80  Resp: 18  Temp: 97.9 F (36.6 C)  SpO2: 98%    CONSTITUTIONAL: Alert, responds appropriately to questions. Well-appearing; well-nourished, pleasant HEAD: Normocephalic, atraumatic EYES: Conjunctivae clear, pupils appear equal, sclera nonicteric ENT: normal nose; moist mucous membranes NECK: Supple, normal ROM CARD: RRR; S1 and S2 appreciated RESP: Normal chest excursion without splinting or tachypnea; breath sounds clear and equal bilaterally; no wheezes, no rhonchi, no rales, no hypoxia or respiratory distress, speaking full sentences ABD/GI: Non-distended; soft, tender in the right lower quadrant without guarding or rebound BACK: The back appears normal, tender over the right lumbar paraspinal muscles without crepitus, ecchymosis, deformity, rash, redness or increased warmth.  No midline spinal tenderness or step-off or deformity. EXT: Normal ROM in all joints; no deformity noted, no edema SKIN: Normal color for age and race; warm; no rash on exposed skin NEURO: Moves all extremities equally, normal speech, ambulates with normal gait, no saddle anesthesia, no clonus PSYCH: The patient's mood and manner are appropriate.   ED Results / Procedures / Treatments   LABS: (all labs ordered are listed, but only abnormal results are  displayed) Labs Reviewed  URINALYSIS, ROUTINE W REFLEX MICROSCOPIC - Abnormal; Notable for the following components:      Result Value   Color, Urine STRAW (*)    APPearance CLEAR (*)    Bacteria, UA RARE (*)    All other components within normal limits  BASIC METABOLIC PANEL WITH GFR - Abnormal; Notable for the following components:   Potassium 3.4 (*)    Glucose, Bld 146 (*)    All other components within normal limits  CBC - Abnormal; Notable for the following components:   WBC 10.9 (*)    All other components within normal limits  HEPATIC FUNCTION PANEL - Abnormal; Notable for the following components:   Bilirubin, Direct 0.3 (*)    All other components within normal limits  PROTIME-INR - Abnormal; Notable for the following components:   Prothrombin Time 25.9 (*)    INR 2.2 (*)    All other components within normal limits  URINE CULTURE  LIPASE, BLOOD     EKG:   RADIOLOGY: My personal review and interpretation of imaging: CT scan shows no appendicitis.  No kidney stone.  No pyelonephritis.  No abnormality of the lumbar spine noted.  I have personally reviewed all radiology reports.   CT ABDOMEN PELVIS W CONTRAST Result Date: 03/13/2024 EXAM: CT ABDOMEN AND PELVIS WITH CONTRAST 08/09/2023 TECHNIQUE: CT of the abdomen and pelvis was performed with the administration of 100 mL of iohexol  (OMNIPAQUE ) 300 MG/ML solution. Multiplanar reformatted images are provided for review. Automated exposure control, iterative reconstruction, and/or weight-based adjustment of the mA/kV was utilized to reduce the radiation dose to as low as reasonably achievable. COMPARISON: None available. CLINICAL HISTORY: RLQ abdominal pain. FINDINGS: LOWER CHEST: Following cardiac silhouette. Partially visualized cardiac leads. LIVER: The liver is unremarkable. GALLBLADDER AND BILE DUCTS: Status post cholecystectomy. No biliary ductal dilatation. SPLEEN: No acute abnormality. PANCREAS: No acute abnormality.  ADRENAL GLANDS: No acute abnormality. KIDNEYS, URETERS AND BLADDER: No stones in the kidneys or ureters. No hydronephrosis. No perinephric or periureteral stranding. No filling defects of the partially visualized collecting systems on delayed imaging. Urinary bladder is unremarkable. GI AND BOWEL: Stomach demonstrates no acute abnormality. No small or large bowel wall thickening or dilatation. Colonic diverticulosis. Unremarkable appendix. PERITONEUM AND RETROPERITONEUM: No ascites. No free air. VASCULATURE:  Aorta is normal in caliber. Severe atherosclerotic plaque. LYMPH NODES: No lymphadenopathy. REPRODUCTIVE ORGANS: Post hysterectomy. No adnexal mass. BONES AND SOFT TISSUES: No acute osseous abnormality. No focal soft tissue abnormality. IMPRESSION: 1. No acute findings in the abdomen or pelvis. 2. Status post cholecystectomy and hysterectomy. Electronically signed by: Morgane Naveau MD 03/13/2024 12:45 AM EST RP Workstation: [id number]     PROCEDURES:  Critical Care performed: No     Procedures    IMPRESSION / MDM / ASSESSMENT AND PLAN / ED COURSE  I reviewed the triage vital signs and the nursing notes.    Patient here with complaints of right low back pain, right lower quadrant abdominal pain.    DIFFERENTIAL DIAGNOSIS (includes but not limited to):   Appendicitis, colitis, diverticulitis, UTI, kidney stone, pyelonephritis, musculoskeletal back pain, doubt cauda equina, epidural abscess or hematoma, discitis or osteomyelitis, fracture   Patient's presentation is most consistent with acute presentation with potential threat to life or bodily function.   PLAN: Will obtain labs, urine, CT of the abdomen pelvis.  Will give Toradol  for pain control here.   MEDICATIONS GIVEN IN ED: Medications  lidocaine  (LIDODERM ) 5 % 1 patch (1 patch Transdermal Patch Applied 03/13/24 0127)  ketorolac  (TORADOL ) 30 MG/ML injection 30 mg (30 mg Intravenous Given 03/12/24 2346)  sodium chloride   0.9 % bolus 500 mL (0 mLs Intravenous Stopped 03/13/24 0117)  iohexol  (OMNIPAQUE ) 300 MG/ML solution 100 mL (100 mLs Intravenous Contrast Given 03/13/24 0026)  methocarbamol  (ROBAXIN ) injection 500 mg (500 mg Intravenous Given 03/13/24 0127)     ED COURSE: Labs show no leukocytosis.  Normal creatinine.  Urine shows no blood or sign of infection.  CT scan reviewed and interpreted by myself and the radiologist and is unremarkable.  Appendix is normal.  Spine appears normal.  She reports improvement with Toradol  but still having some discomfort.  Will give Lidoderm  patch, Robaxin .  Patient and mother comfortable with plan for discharge home for what I suspect is musculoskeletal back pain.  Discussed supportive care instructions and return precautions.  Will discharge with prescriptions for Tylenol , short course of ibuprofen , Robaxin  and Lidoderm  patches.   At this time, I do not feel there is any life-threatening condition present. I reviewed all nursing notes, vitals, pertinent previous records.  All lab and urine results, EKGs, imaging ordered have been independently reviewed and interpreted by myself.  I reviewed all available radiology reports from any imaging ordered this visit.  Based on my assessment, I feel the patient is safe to be discharged home without further emergent workup and can continue workup as an outpatient as needed. Discussed all findings, treatment plan as well as usual and customary return precautions.  They verbalize understanding and are comfortable with this plan.  Outpatient follow-up has been provided as needed.  All questions have been answered. no   CONSULTS:  none   OUTSIDE RECORDS REVIEWED: Reviewed recent internal medicine notes.       FINAL CLINICAL IMPRESSION(S) / ED DIAGNOSES   Final diagnoses:  Acute right-sided low back pain without sciatica  RLQ abdominal pain     Rx / DC Orders   ED Discharge Orders          Ordered    ibuprofen  (ADVIL ) 600  MG tablet  Every 8 hours PRN        03/13/24 0113    acetaminophen  (TYLENOL ) 500 MG tablet  Every 6 hours PRN        03/13/24 0113  lidocaine  (LIDODERM ) 5 %  Every 24 hours        03/13/24 0113    methocarbamol  (ROBAXIN ) 500 MG tablet  Every 8 hours PRN        03/13/24 0113             Note:  This document was prepared using Dragon voice recognition software and may include unintentional dictation errors.   Jalyne Brodzinski, Josette SAILOR, DO 03/13/24 0211

## 2024-03-13 ENCOUNTER — Emergency Department

## 2024-03-13 DIAGNOSIS — R1031 Right lower quadrant pain: Secondary | ICD-10-CM | POA: Diagnosis not present

## 2024-03-13 DIAGNOSIS — Z9049 Acquired absence of other specified parts of digestive tract: Secondary | ICD-10-CM | POA: Diagnosis not present

## 2024-03-13 DIAGNOSIS — Z90710 Acquired absence of both cervix and uterus: Secondary | ICD-10-CM | POA: Diagnosis not present

## 2024-03-13 DIAGNOSIS — K5730 Diverticulosis of large intestine without perforation or abscess without bleeding: Secondary | ICD-10-CM | POA: Diagnosis not present

## 2024-03-13 LAB — URINALYSIS, ROUTINE W REFLEX MICROSCOPIC
Bilirubin Urine: NEGATIVE
Glucose, UA: NEGATIVE mg/dL
Hgb urine dipstick: NEGATIVE
Ketones, ur: NEGATIVE mg/dL
Leukocytes,Ua: NEGATIVE
Nitrite: NEGATIVE
Protein, ur: NEGATIVE mg/dL
Specific Gravity, Urine: 1.006 (ref 1.005–1.030)
pH: 6 (ref 5.0–8.0)

## 2024-03-13 LAB — HEPATIC FUNCTION PANEL
ALT: 13 U/L (ref 0–44)
AST: 18 U/L (ref 15–41)
Albumin: 4.4 g/dL (ref 3.5–5.0)
Alkaline Phosphatase: 86 U/L (ref 38–126)
Bilirubin, Direct: 0.3 mg/dL — ABNORMAL HIGH (ref 0.0–0.2)
Indirect Bilirubin: 0.4 mg/dL (ref 0.3–0.9)
Total Bilirubin: 0.7 mg/dL (ref 0.0–1.2)
Total Protein: 7.2 g/dL (ref 6.5–8.1)

## 2024-03-13 LAB — PROTIME-INR
INR: 2.2 — ABNORMAL HIGH (ref 0.8–1.2)
Prothrombin Time: 25.9 s — ABNORMAL HIGH (ref 11.4–15.2)

## 2024-03-13 LAB — LIPASE, BLOOD: Lipase: 38 U/L (ref 11–51)

## 2024-03-13 MED ORDER — IOHEXOL 300 MG/ML  SOLN
100.0000 mL | Freq: Once | INTRAMUSCULAR | Status: AC | PRN
  Administered 2024-03-13: 100 mL via INTRAVENOUS

## 2024-03-13 MED ORDER — LIDOCAINE 5 % EX PTCH
1.0000 | MEDICATED_PATCH | CUTANEOUS | Status: DC
  Administered 2024-03-13: 1 via TRANSDERMAL
  Filled 2024-03-13: qty 1

## 2024-03-13 MED ORDER — ACETAMINOPHEN 500 MG PO TABS: 1000.0000 mg | ORAL_TABLET | Freq: Four times a day (QID) | ORAL | 1 refills | Status: AC | PRN

## 2024-03-13 MED ORDER — METHOCARBAMOL 500 MG PO TABS
500.0000 mg | ORAL_TABLET | Freq: Three times a day (TID) | ORAL | 0 refills | Status: AC | PRN
Start: 2024-03-13 — End: ?

## 2024-03-13 MED ORDER — IBUPROFEN 600 MG PO TABS: 600.0000 mg | ORAL_TABLET | Freq: Three times a day (TID) | ORAL | 0 refills | Status: AC | PRN

## 2024-03-13 MED ORDER — LIDOCAINE 5 % EX PTCH: 1.0000 | MEDICATED_PATCH | CUTANEOUS | 0 refills | Status: AC

## 2024-03-13 MED ORDER — METHOCARBAMOL 1000 MG/10ML IJ SOLN
500.0000 mg | Freq: Once | INTRAMUSCULAR | Status: AC
  Administered 2024-03-13: 500 mg via INTRAVENOUS
  Filled 2024-03-13: qty 5

## 2024-03-13 NOTE — Discharge Instructions (Addendum)
 You may alternate over the counter Tylenol  1000 mg every 6 hours as needed for pain, fever and Ibuprofen  600 mg every 8 hours as needed for pain, fever.  Please take Ibuprofen  with food.  Do not take more than 4000 mg of Tylenol  (acetaminophen ) in a 24 hour period.  I recommend trying Tylenol  first for pain control and only using ibuprofen  sparingly because you are on warfarin.  Please do not take ibuprofen  for more than 5 days.  We have provided you with a prescription for Robaxin  which is a muscle relaxer.  This can help with your pain and is safe to take along with Tylenol  and ibuprofen .  This medication may make you drowsy so do not take it with other sedatives or if you are driving.  You may also apply a Lidoderm  patch once daily to your right lower back to help with the pain.  We have sent your prescriptions to the [facility] on [address] in [place] close to the hospital and across the street from [place].  Your lab work today was normal with normal blood counts, kidney function, liver and pancreas test, electrolytes.  Urine showed no sign of infection or blood.  CT scan of your abdomen, pelvis, back showed no acute abnormality and showed no appendicitis.   I suspect that the pain you are experiencing is from a lumbar muscle strain or muscle spasm.  Sometimes this can happen even without a known injury.  The bones on your CT scan appeared normal and showed no sign of fracture.  You do not have any sign of a neurosurgical emergency but if you were to develop worsening back pain that is uncontrolled by medications at home, numbness or weakness in your legs, inability to hold your bowel or bladder, unable to empty your bladder, fever of 100.4 or higher, unable to walk, please return to the emergency department.  I recommend alternating heat and ice to your back, stretching, gentle massage.  I recommend not lifting anything over 10 pounds for the next several days.

## 2024-03-14 LAB — URINE CULTURE

## 2024-03-17 DIAGNOSIS — H9193 Unspecified hearing loss, bilateral: Secondary | ICD-10-CM | POA: Diagnosis not present

## 2024-03-17 DIAGNOSIS — Z952 Presence of prosthetic heart valve: Secondary | ICD-10-CM | POA: Diagnosis not present

## 2024-03-17 DIAGNOSIS — Z7901 Long term (current) use of anticoagulants: Secondary | ICD-10-CM | POA: Diagnosis not present

## 2024-03-17 DIAGNOSIS — E119 Type 2 diabetes mellitus without complications: Secondary | ICD-10-CM | POA: Diagnosis not present

## 2024-04-27 DIAGNOSIS — R2689 Other abnormalities of gait and mobility: Secondary | ICD-10-CM

## 2024-04-27 DIAGNOSIS — Z87898 Personal history of other specified conditions: Secondary | ICD-10-CM

## 2024-04-27 DIAGNOSIS — R413 Other amnesia: Secondary | ICD-10-CM

## 2024-04-27 NOTE — Progress Notes (Signed)
 Ref Provider: Maree Jannett Bong*  PCP: Brianna Norleen Lenis, MD  Assessment and Plan:   In most patients, we give written parts of the assessment and plan to put the patient under Patient Instructions/After Visit Summary. So some parts are directed to the patient.  Dear [name], It was our pleasure to participate in your care. We have typed up a summary of what we discussed. Assessment & Plan Concern for depression - improvement - On Abilify   Episodes of dizziness in a patient with history of seizures, history of stroke, left frontal encephalomalacia. Likely residual effects of stroke. Patient has significant dizziness with negative work up. - reports improvement  - continue to monitor   MRI brain without contrast 08/08/2021: No findings to explain dizziness. Encephalomalacia/gliosis within the left frontal lobe which also includes the left precentral gyrus.  Spell of blank staring and unresponsiveness On Lamotrigine . Reports episodes still occurring. Discussed how disrupted sleep may contribute to symptoms - Continue Lamictal  100 mg twice daily   - Labs as below   Insomnia  Concern for sleep apnea with frequent nighttime awakening and Mallampati Score of IV.  - Order three night, at-home sleep study   Gait disturbance Difficulty with right leg and unsteady gait. No new stroke-like symptoms. Likely preexisting due to history of stroke and microvascular ischemic changes. Alcohol  may contribute to balance issues. - We will order a home health consult with physical therapy, occupational therapy, speech therapy, nursing, social work - please call patient Mom for scheduling  We send patients to [facility]. They will verify your insurance information and reach out to schedule an appointment. If you need to reach them, you can also contact them directly using the information below: Information for new patients [facility]um): [phone] Scheduling for current  patients ([facility] office): [phone]  Chronic bilateral frontal and left basal ganglia infarcts and moderate white matter microvascular ischemic and metabolic changes - No new strokes or stroke like activity  - Anticoagulated with warfarin   Memory loss Reports memory concerns and occasional forgetfulness. Differential diagnosis includes B12 deficiency and Alzheimer's disease. - Ordered MRI of the brain. - Ordered lab work to screen for B12 deficiency and Alzheimer's disease.  - MRI Brain without contrast + NeuroQuant  - We will order labs to check TSH, Vit B12, Vit B1, Vit D, folate, Treponema Pallidum (syphilis) screening cascade, Phosphorylated Tau 217 (p-Tau 217), plasma ([facility] number [id number]), Beta Amyloid 42/40 ratio, Plasma (OjaR1283, [facility] number [id number]), APO Alzheimer's Risk (OjaR1884, [facility] number [id number]), Lamotrigine    Seizure disorder Currently on lamotrigine  100 mg twice daily. No recent seizure-like activity reported. Advised to continue current medication regimen. - Continue lamotrigine  100 mg twice daily.  Return in about 4 months (around 08/25/2024) for Memory, Kaitlin Caffaro, PA-C.   We communicated with the Brianna Harris and her family with the help of online video / audio interpreter from [employer] ASL Translating services. This required more than usual time for this type of encounter. ASL Translating Service was used during this visit. [name] Handing ID #[id number] Interim History date 04/27/2024   Brianna Harris is a 64 y.o. female here for treatment and evaluation of Seizures, accompanied by loved one.  Brianna Harris last visit was on 07/31/2023  History of Present Illness The patient, with a history of depression and stroke, presents with recent onset of balance issues and abnormal gait. The patient's friend first noticed the patient's right leg was dragging and the patient's balance seemed off. The  patient's balance issues have been progressively worsening over the past  few weeks. The patient also reports intermittent staring episodes despite being on Lamictal  100mg  twice daily. The patient's mood is reported as on and off slightly better since starting Abilify, prescribed by her psychiatrist. The patient also reports occasional dizziness, but it does not occur frequently.  Brianna Harris is a 64 year old female who presents with memory concerns and balance issues. She is accompanied by her mother, who assists with her medications and appointments. Her friend Hadassah suggested she undergo a scan due to memory issues.  Cognitive impairment - Episodes of forgetfulness - Friend recommended neuroimaging due to memory concerns - No seizure-like activity or loss of consciousness  Gait and balance disturbance - Walks slowly due to balance issues - Easily loses balance - No recent falls or injuries  Seizure activity - Staring episodes consistent with seizure activity - Currently taking lamotrigine  100 mg for seizure management - Mother discourages staring episodes due to social appropriateness  Sleep disturbance - No snoring observed - Mother observes nocturnal awakenings at 3 AM to shower and play video games - Frequent nocturnal urination disrupts sleep - No prior sleep study for obstructive sleep apnea  Dizziness - Experienced dizziness last month - Dizziness resolved spontaneously without injury - Uncertain etiology  Dental issues - Chronic broken upper left tooth - Requires dental evaluation   Disease Summary: (Aggregate of information from previous visits)   Brianna Harris is a left handed 64 y.o. female here for evaluation of Seizures  Concern for depression  Per telephone encounter on 04/25/2022, patient's mother reported patient has been Crying and more forgetful.  Episodes of dizziness in a patient with history of seizures, history of stroke, left frontal encephalomalacia. Likely residual effects of stroke. Patient has significant  dizziness with negative work up: Patient states she has been having episodes of dizziness since around 2017. Occasionally she will feel off balance during the episodes. Symptoms onset was gradual and has progressively worsened. Symptoms are intermittent. She denies constant dizziness. Per patient at the start of an episode she will begin to feel as though she is slowing down. She has to wait a little before she can sit down. Patient states her dizziness occurs when sitting. Denies dizziness while standing. Episodes occur around two times daily and last around ten minutes at a time. She has not had an episode lasting 3-7 days at onset. She complains of associated bilateral ear pain. Denies associated headache, weakness or numbness in her legs, nausea, vision changes. Denies history of head trauma.  Patient reports dizziness is exacerbated by placing her head down and bringing it up quickly or moving quickly. She has some hesitation prior to sitting down due to feeling of imbalance or dizziness. Per patient imbalance occurs at night. Denies imbalance during the day. She is not feeling imbalanced today in the office. The episode of dizziness occurs occasionally about once weekly.    06/26/2021: Neurology received message from primary care physician concerning sudden onset dizziness and frequent falls with head injury while anticoagulated with warfarin. CT head was unremarkable at this time. Patient and loved one report episodes of staring, lasting 4-5 minutes, with possible loss of consciousness - unclear as patient reports gradual loss of consciousness/sleep for several hours and sudden loss of consciousness. Episodes described as sitting, standing, counting, looking around, and staring up. Patient reports lamotrigine  is ineffective in improving episodes. Patient reports occasional dizzy spells while in the car lasting 5 minutes.  Reports episode of dizziness 05/2021 and 03/2021. Patient has pacemaker. Reports labile  blood pressure, as well as as occasional alterations to breathing patterns, though does not elaborate. Reports history of fatty liver. Asks if she can resume smoking and drinking. Denies nausea, vomiting, tinnitus, headache, visual changes, other associated focal neurologic symptoms associated with dizziness.   CT Head (Brain) 04/12/2021: no acute intracranial abnormality   MRI brain without contrast 08/08/2021: No findings to explain dizziness. Encephalomalacia/gliosis within the left frontal lobe which also includes the left precentral gyrus.  Medications: atorvastatin , warfarin, metoprolol    Left frontal encephalomalacia per MRI 08/08/2021  White matter microvascular ischemic and metabolic changes per MRI 08/08/2021: Patient on atorvastatin  40 mg.   Spell of blank staring and unresponsiveness - and forgetfulness should improve with increased dose of lamotrigine .   Lamotrigine  06/26/2021: 4.8 Lamotrigine  12/06/2020: 5.9   Medications: Lamictal    History of strokes: Bilateral frontal infarcts - No new strokes or stroke like activity: anticoagulation therapy with warfarin. Taking atorvastatin .   MRI Brain 04/10/2017:  No acute intracranial abnormality.Left superolateral frontal lobe chronic infarction. Small chronic lacunar infarction of left caudate head. Moderate chronic microvascular ischemic changes and parenchymal volume loss of the brain. Small size of left ICA flow void may be due to stenosis or anatomic variation   Head CT 01/07/2019: No change since previous studies. No acute finding. Old infarction in the left basal ganglia. Old infarction affecting both frontal cortical and subcortical regions, more extensive on the left than the right. Chronic small-vessel ischemic changes elsewhere within the hemispheric white matter.  Status post aortic valve replacement - has pacemaker: Follows with Cardiology (Dr. Fath/Dr. Lawyer).  Right shoulder pain - suspected SITS muscle injury - improved  (patient denied shoulder pain 06/26/2021): Referred to orthopedics for further evaluation and treatment. Patient reports pain on right shoulder with extension, flexion, internal and external rotation. NSAIDs ineffective. Patient does not recall injury, but memory is impaired. In the past she used to paint, move hay, and worked on a farm.  Hypertension: following cardiology.   Fatty Liver: Referred to Gastroenterology.   Alcohol  abuse + tobacco use: Patient drinks alcohol  about 5-6 glasses of wine each night.  Denies any other alcohol  intake.  She has passes out from alcohol  almost nightly.  She considers herself an alcoholic.  She drinks alone. Patient quit drinking in 2020.   Patient also notes having excessive fatigue.    Dizziness/falls   Physical Exam   Vitals Vitals:   04/27/24 1052  BP: 112/66  Pulse: 73  SpO2: 96%  Weight: 57.2 kg (126 lb 3.2 oz)  Height: 157.5 cm (5' 2)  PainSc: 0-No pain     Body mass index is 23.08 kg/m.  (Some of the exam changes noted are from previous clinical observations)  General Exam Cardiovascular Exam: S1, S2 heart sounds present, S2 click  Anxious, shaking left leg during office visit  Patient does not speak, communicates with American Sign Language   Neurological Exam She reports pain in right shoulder and arm when she is raising her arm vertically or horizontally (abduction). Also has pain in right arm on the extensor surface of arm immediately distal to [facility] joint-this is with only light palpation. No bruising or sign of recent trauma or injury evident on exam.   No pain with adduction.The most pain occurs past 45 degree extension. She is able to complete the Apley scratch maneuver with pain and difficulty.   Wide based gait, quite unsteady   Past  Medical History:  Past Medical History:  Diagnosis Date   Alcoholism /alcohol  abuse 05/21/2016   Aortic atherosclerosis 03/2019   Aortic valve stenosis 08/03/2016   s/p replacement    Asthma without status asthmaticus (HHS-HCC)    AVM (arteriovenous malformation) of colon without hemorrhage 04/12/2020   Cardiac pacemaker in situ 06/09/2018   St. Jude Assurity 2272 dual-chamber pacemaker with St. Jude [phone] atrial and ventricular leads, all implanted 02/20/2018.   Chronic depression    Colon cancer screening 05/21/2016   Congenital deafness    COPD (chronic obstructive pulmonary disease) (CMS/HHS-HCC)    Drug abuse (CMS-HCC)    Admission for rehabilitation   Fatty liver 12/11/2019   Gastrointestinal stromal tumor (GIST) of stomach (CMS/HHS-HCC) 07/2023   GERD (gastroesophageal reflux disease)    H/O partial seizures 02/17/2018   Heart murmur    History of cocaine abuse (CMS-HCC) 01/20/2018   Hyperlipidemia    Hypertension    Iron deficiency anemia 12/24/2019   Ischemic stroke of frontal lobe (CMS/HHS-HCC)    Junctional bradycardia 02/17/2018   Left sided lacunar stroke (CMS/HHS-HCC)    Overactive bladder    Post-surgical complete heart block (CMS/HHS-HCC) 06/09/2018   Red blood cell antibody positive, compatible PRBC difficult to obtain 02/14/2018   Anti-M detected. Please allow 4 hours to prepare appropriate RBCs for transfusion.    S/P AVR (aortic valve replacement) 02/14/2018   Type 2 diabetes mellitus (CMS/HHS-HCC) 2011    Past Surgical History:  Past Surgical History:  Procedure Laterality Date   HYSTERECTOMY  2005   TAH-BSO, bleeding   COLONOSCOPY  08/17/2016   Hyperplastic Polyps: CBF 07/2026   EGD  08/17/2016   Gastritis: No repeat per RTE   REPLACEMENT AORTIC VALVE N/A 02/14/2018   Procedure: ADULT, REPLACEMENT, AORTIC VALVE, OPEN, WITH CARDIOPULMONARY BYPASS, STERNOTOMY; WITH PROSTHETIC VALVE OTHER THAN HOMOGRAFT OR STENTLESS VALVE;  Surgeon: Dora Bernardino Dover, MD;  Location: [facility];  Service: Cardiothoracic;  Laterality: N/A;   APPLICATION WOUND VAC N/A 02/14/2018   Procedure: NEGATIVE PRESSURE WOUND  THERAPY, INCLUDING TOPICAL APPLICATION(S), ASSESSMENT, INSTRUCTION(S) FOR ONGOING CARE, PER SESSION; TOTAL WOUND(S) SURFACE AREA LESS THAN OR EQUAL TO 50 SQUARE CENTIMETERS;  Surgeon: Dora Bernardino Dover, MD;  Location: [facility];  Service: Cardiothoracic;  Laterality: N/A;   UPPER GASTROINTESTINAL ENDOSCOPY N/A 10/22/2023   Procedure: ESOPHAGOGASTRODUODENOSCOPY, FLEXIBLE, TRANSORAL; W/TRANSENDOSCOPIC ULTRASOUND-GUIDED INTRAMURAL OR TRANSMURAL FINE NEEDLE ASPIRATION/BIOPSY, (ESOPHAGUS, STOMACH OR DUODENUM, AND ADJACENT STRUCTURES);  Surgeon: Jama Fonda Ruse, MD;  Location: [facility];  Service: Gastroenterology;  Laterality: N/A;   CHOLECYSTECTOMY     COLONOSCOPY  2008 ([facility])   Diverticulosis, Hemorrhoid    INSERT / REPLACE / REMOVE PACEMAKER     St Jude Surety MRI DR device / dual-chamber pacemaker   OOPHORECTOMY     Family History:  Family History  Problem Relation Name Age of Onset   Depression Mother Blima    Coronary Artery Disease (Blocked arteries around heart) Father     Myocardial Infarction (Heart attack) Father     High blood pressure (Hypertension) Maternal Grandmother     High blood pressure (Hypertension) Maternal Grandfather     Deep vein thrombosis (DVT or abnormal blood clot formation) Paternal Grandmother     Diabetes Maternal Uncle     Social History:  Social History   Socioeconomic History   Marital status: Single  Occupational History   Occupation: Disabled  Tobacco Use   Smoking status: Former    Current packs/day: 0.00  Average packs/day: 1 pack/day for 40.0 years (40.0 ttl pk-yrs)    Types: Cigarettes    Start date: 02/14/1978    Quit date: 02/13/2018    Years since quitting: 6.2   Smokeless tobacco: Never  Vaping Use   Vaping status: Former  Substance and Sexual Activity   Alcohol  use: Not Currently    Comment: previously heavy drinker, quit about 2 years ago --drinks wine quarterly   Drug use: Not  Currently    Comment: history of crack cocaine use--remote hx   Sexual activity: Defer  Social History Narrative   Lives in [place], [place].  On disability.    Social Drivers of Corporate Investment Banker Strain: Low Risk  (11/11/2023)   Overall Financial Resource Strain (CARDIA)    Difficulty of Paying Living Expenses: Not hard at all  Food Insecurity: No Food Insecurity (11/11/2023)   Hunger Vital Sign    Worried About Running Out of Food in the Last Year: Never true    Ran Out of Food in the Last Year: Never true  Transportation Needs: No Transportation Needs (11/11/2023)   PRAPARE - Administrator, Civil Service (Medical): No    Lack of Transportation (Non-Medical): No  Housing Stability: Low Risk  (04/20/2024)   Housing Stability Vital Sign    Unable to Pay for Housing in the Last Year: No    Number of Times Moved in the Last Year: 0    Homeless in the Last Year: No   Allergies: No Known Allergies Medications: Current Outpatient Medications on File Prior to Visit  Medication Sig Dispense Refill   acetaminophen  (TYLENOL ) 500 MG tablet Take 1,000 mg by mouth every 6 (six) hours as needed     amitriptyline  (ELAVIL ) 25 MG tablet TAKE 1 TABLET BY MOUTH AT BEDTIME 90 tablet 1   amLODIPine (NORVASC) 5 MG tablet Take 1 tablet (5 mg total) by mouth once daily 30 tablet 11   atorvastatin  (LIPITOR) 40 MG tablet Take 1 tablet (40 mg total) by mouth once daily 90 tablet 2   ferrous sulfate 325 (65 FE) MG EC tablet Take 325 mg by mouth daily with breakfast     hydroCHLOROthiazide (HYDRODIURIL) 25 MG tablet Take 1 tablet (25 mg total) by mouth once daily 30 tablet 11   lamoTRIgine  (LAMICTAL ) 100 MG tablet Take 1 tablet (100 mg total) by mouth 2 (two) times daily 180 tablet 1   metFORMIN  (GLUCOPHAGE -XR) 500 MG XR tablet Take 4 tablets (2,000 mg total) by mouth daily with dinner (Patient taking differently: Take 2,000 mg by mouth daily with dinner 1000 mg bid) 360  tablet 3   metoprolol  SUCCinate (TOPROL -XL) 50 MG XL tablet Take 1 tablet (50 mg total) by mouth once daily 90 tablet 3   omeprazole (PRILOSEC) 40 MG DR capsule Take 1 capsule (40 mg total) by mouth once daily 90 capsule 0   warfarin (COUMADIN ) 4 MG tablet TAKE 1 TABLET BY MOUTH ONCE DAILY AS DIRECTED 90 tablet 0   acetaminophen  (TYLENOL ) 500 mg capsule Take 1,000 mg by mouth as needed     blood-glucose meter (ONETOUCH ULTRA2 METER) Misc Use daily to check blood sugars (Patient not taking: Reported on 04/27/2024) 1 each 0   blood-glucose sensor (FREESTYLE LIBRE 2 PLUS SENSOR) Use 1 each every 15 (fifteen) days (Patient not taking: Reported on 04/27/2024) 6 each 1   flash glucose sensor (FREESTYLE LIBRE 2 SENSOR) Kit Use 1 kit every 14 (fourteen) days for glucose monitoring (  Patient not taking: Reported on 04/27/2024) 6 kit 3   No current facility-administered medications on file prior to visit.   This note has been created using automated tools and reviewed for accuracy by KAITLIN CAFFARO.

## 2024-06-04 ENCOUNTER — Other Ambulatory Visit (HOSPITAL_COMMUNITY)

## 2024-06-04 ENCOUNTER — Encounter (HOSPITAL_COMMUNITY): Payer: Self-pay

## 2024-06-05 ENCOUNTER — Other Ambulatory Visit

## 2024-07-22 ENCOUNTER — Other Ambulatory Visit

## 2024-12-04 ENCOUNTER — Other Ambulatory Visit

## 2024-12-04 ENCOUNTER — Ambulatory Visit: Admitting: Oncology
